# Patient Record
Sex: Female | Born: 1950 | Race: White | Hispanic: No | Marital: Married | Smoking: Never smoker
Health system: Southern US, Community
[De-identification: ages and names within clinical notes are randomized; demographics above are authoritative.]

## PROBLEM LIST (undated history)

## (undated) DIAGNOSIS — E7800 Pure hypercholesterolemia, unspecified: Secondary | ICD-10-CM

## (undated) DIAGNOSIS — R7303 Prediabetes: Secondary | ICD-10-CM

## (undated) DIAGNOSIS — R350 Frequency of micturition: Secondary | ICD-10-CM

## (undated) DIAGNOSIS — C801 Malignant (primary) neoplasm, unspecified: Secondary | ICD-10-CM

## (undated) DIAGNOSIS — N390 Urinary tract infection, site not specified: Secondary | ICD-10-CM

## (undated) DIAGNOSIS — I499 Cardiac arrhythmia, unspecified: Secondary | ICD-10-CM

## (undated) DIAGNOSIS — I8390 Asymptomatic varicose veins of unspecified lower extremity: Secondary | ICD-10-CM

## (undated) DIAGNOSIS — K219 Gastro-esophageal reflux disease without esophagitis: Secondary | ICD-10-CM

## (undated) DIAGNOSIS — Z87898 Personal history of other specified conditions: Secondary | ICD-10-CM

## (undated) DIAGNOSIS — D649 Anemia, unspecified: Secondary | ICD-10-CM

## (undated) DIAGNOSIS — I639 Cerebral infarction, unspecified: Secondary | ICD-10-CM

## (undated) DIAGNOSIS — J40 Bronchitis, not specified as acute or chronic: Secondary | ICD-10-CM

## (undated) DIAGNOSIS — I4891 Unspecified atrial fibrillation: Secondary | ICD-10-CM

## (undated) DIAGNOSIS — M8580 Other specified disorders of bone density and structure, unspecified site: Secondary | ICD-10-CM

## (undated) DIAGNOSIS — M1990 Unspecified osteoarthritis, unspecified site: Secondary | ICD-10-CM

## (undated) DIAGNOSIS — Z8719 Personal history of other diseases of the digestive system: Secondary | ICD-10-CM

## (undated) DIAGNOSIS — R002 Palpitations: Secondary | ICD-10-CM

## (undated) DIAGNOSIS — H269 Unspecified cataract: Secondary | ICD-10-CM

## (undated) HISTORY — PX: OTHER SURGICAL HISTORY: SHX169

## (undated) HISTORY — PX: TENDON REPAIR: SHX5111

---

## 1998-11-24 ENCOUNTER — Other Ambulatory Visit: Admission: RE | Admit: 1998-11-24 | Discharge: 1998-11-24 | Payer: Self-pay | Admitting: Obstetrics & Gynecology

## 1999-06-23 ENCOUNTER — Other Ambulatory Visit: Admission: RE | Admit: 1999-06-23 | Discharge: 1999-06-23 | Payer: Self-pay | Admitting: Gastroenterology

## 2011-09-06 HISTORY — PX: MOUTH SURGERY: SHX715

## 2014-02-17 ENCOUNTER — Other Ambulatory Visit: Payer: Self-pay | Admitting: Obstetrics & Gynecology

## 2014-02-18 LAB — CYTOLOGY - PAP

## 2015-03-05 ENCOUNTER — Other Ambulatory Visit: Payer: Self-pay | Admitting: Obstetrics & Gynecology

## 2015-03-05 DIAGNOSIS — R928 Other abnormal and inconclusive findings on diagnostic imaging of breast: Secondary | ICD-10-CM

## 2015-03-11 ENCOUNTER — Ambulatory Visit
Admission: RE | Admit: 2015-03-11 | Discharge: 2015-03-11 | Disposition: A | Discharge status: Home / Self Care | Payer: 59 | Source: Ambulatory Visit | Attending: Obstetrics & Gynecology | Admitting: Obstetrics & Gynecology

## 2015-03-11 DIAGNOSIS — R928 Other abnormal and inconclusive findings on diagnostic imaging of breast: Secondary | ICD-10-CM

## 2015-06-11 DIAGNOSIS — C801 Malignant (primary) neoplasm, unspecified: Secondary | ICD-10-CM

## 2015-06-11 HISTORY — PX: OTHER SURGICAL HISTORY: SHX169

## 2015-06-11 HISTORY — DX: Malignant (primary) neoplasm, unspecified: C80.1

## 2015-06-26 NOTE — Patient Instructions (Signed)
Tonya Graves  06/26/2015   Your procedure is scheduled on: Monday 07/13/15  Report to [facility] Main  Entrance take [facility]  elevators to 3rd floor to  [place] at 9:40AM.  Call this number if you have problems the morning of surgery [phone]   Remember: ONLY 1 PERSON MAY GO WITH YOU TO SHORT STAY TO GET  READY MORNING OF [place].  Do not eat food or drink liquids :After Midnight.     Take these medicines the morning of surgery with A SIP OF WATER: Omeprazole (Prilosec) DO NOT TAKE ANY DIABETIC MEDICATIONS DAY OF YOUR SURGERY                               You may not have any metal on your body including hair pins and              piercings  Do not wear jewelry, make-up, lotions, powders or perfumes, deodorant             Do not wear nail polish.  Do not shave  48 hours prior to surgery.              Men may shave face and neck.   Do not bring valuables to the hospital. [place].  Contacts, dentures or bridgework may not be worn into surgery.  Leave suitcase in the car. After surgery it may be brought to your room.     Patients discharged the day of surgery will not be allowed to drive home.  Name and phone number of your driver:  Special Instructions: N/A              Please read over the following fact sheets you were given: _____________________________________________________________________             [facility] - Preparing for Surgery Before surgery, you can play an important role.  Because skin is not sterile, your skin needs to be as free of germs as possible.  You can reduce the number of germs on your skin by washing with CHG (chlorahexidine gluconate) soap before surgery.  CHG is an antiseptic cleaner which kills germs and bonds with the skin to continue killing germs even after washing. Please DO NOT use if you have an allergy to CHG or antibacterial soaps.  If your skin  becomes reddened/irritated stop using the CHG and inform your nurse when you arrive at Short Stay. Do not shave (including legs and underarms) for at least 48 hours prior to the first CHG shower.  You may shave your face/neck. Please follow these instructions carefully:  1.  Shower with CHG Soap the night before surgery and the  morning of Surgery.  2.  If you choose to wash your hair, wash your hair first as usual with your  normal  shampoo.  3.  After you shampoo, rinse your hair and body thoroughly to remove the  shampoo.                           4.  Use CHG as you would any other liquid soap.  You can apply chg directly  to the skin and wash  Gently with a scrungie or clean washcloth.  5.  Apply the CHG Soap to your body ONLY FROM THE NECK DOWN.   Do not use on face/ open                           Wound or open sores. Avoid contact with eyes, ears mouth and genitals (private parts).                       Wash face,  Genitals (private parts) with your normal soap.             6.  Wash thoroughly, paying special attention to the area where your surgery  will be performed.  7.  Thoroughly rinse your body with warm water from the neck down.  8.  DO NOT shower/wash with your normal soap after using and rinsing off  the CHG Soap.                9.  Pat yourself dry with a clean towel.            10.  Wear clean pajamas.            11.  Place clean sheets on your bed the night of your first shower and do not  sleep with pets. Day of Surgery : Do not apply any lotions/deodorants the morning of surgery.  Please wear clean clothes to the hospital/surgery center.  FAILURE TO FOLLOW THESE INSTRUCTIONS MAY RESULT IN THE CANCELLATION OF YOUR SURGERY PATIENT SIGNATURE_________________________________  NURSE SIGNATURE__________________________________  ________________________________________________________________________  WHAT IS A BLOOD TRANSFUSION? Blood Transfusion  Information  A transfusion is the replacement of blood or some of its parts. Blood is made up of multiple cells which provide different functions.  Red blood cells carry oxygen and are used for blood loss replacement.  White blood cells fight against infection.  Platelets control bleeding.  Plasma helps clot blood.  Other blood products are available for specialized needs, such as hemophilia or other clotting disorders. BEFORE THE TRANSFUSION  Who gives blood for transfusions?   Healthy volunteers who are fully evaluated to make sure their blood is safe. This is blood bank blood. Transfusion therapy is the safest it has ever been in the practice of medicine. Before blood is taken from a donor, a complete history is taken to make sure that person has no history of diseases nor engages in risky social behavior (examples are intravenous drug use or sexual activity with multiple partners). The donor's travel history is screened to minimize risk of transmitting infections, such as malaria. The donated blood is tested for signs of infectious diseases, such as HIV and hepatitis. The blood is then tested to be sure it is compatible with you in order to minimize the chance of a transfusion reaction. If you or a relative donates blood, this is often done in anticipation of surgery and is not appropriate for emergency situations. It takes many days to process the donated blood. RISKS AND COMPLICATIONS Although transfusion therapy is very safe and saves many lives, the main dangers of transfusion include:   Getting an infectious disease.  Developing a transfusion reaction. This is an allergic reaction to something in the blood you were given. Every precaution is taken to prevent this. The decision to have a blood transfusion has been considered carefully by your caregiver before blood is given. Blood is not given unless the benefits outweigh  the risks. AFTER THE TRANSFUSION  Right after receiving a  blood transfusion, you will usually feel much better and more energetic. This is especially true if your red blood cells have gotten low (anemic). The transfusion raises the level of the red blood cells which carry oxygen, and this usually causes an energy increase.  The nurse administering the transfusion will monitor you carefully for complications. HOME CARE INSTRUCTIONS  No special instructions are needed after a transfusion. You may find your energy is better. Speak with your caregiver about any limitations on activity for underlying diseases you may have. SEEK MEDICAL CARE IF:   Your condition is not improving after your transfusion.  You develop redness or irritation at the intravenous (IV) site. SEEK IMMEDIATE MEDICAL CARE IF:  Any of the following symptoms occur over the next 12 hours:  Shaking chills.  You have a temperature by mouth above 102 F (38.9 C), not controlled by medicine.  Chest, back, or muscle pain.  People around you feel you are not acting correctly or are confused.  Shortness of breath or difficulty breathing.  Dizziness and fainting.  You get a rash or develop hives.  You have a decrease in urine output.  Your urine turns a dark color or changes to pink, red, or brown. Any of the following symptoms occur over the next 10 days:  You have a temperature by mouth above 102 F (38.9 C), not controlled by medicine.  Shortness of breath.  Weakness after normal activity.  The white part of the eye turns yellow (jaundice).  You have a decrease in the amount of urine or are urinating less often.  Your urine turns a dark color or changes to pink, red, or brown. Document Released: 08/19/2000 Document Revised: 11/14/2011 Document Reviewed: 04/07/2008 [facility] Patient Information 2014 [place], [place].  _______________________________________________________________________

## 2015-06-30 ENCOUNTER — Ambulatory Visit: Payer: Self-pay | Admitting: Orthopedic Surgery

## 2015-06-30 ENCOUNTER — Other Ambulatory Visit (HOSPITAL_COMMUNITY): Payer: Self-pay | Admitting: *Deleted

## 2015-06-30 NOTE — Progress Notes (Signed)
pcp clearance dr Raliegh Ip cox on chart for 11 7 surgery ekg 06-23-15 on chart

## 2015-06-30 NOTE — Progress Notes (Signed)
Preoperative surgical orders have been place into the Epic hospital system for Tonya Graves on 06/30/2015, 1:33 PM  by Mickel Crow for surgery on 07-13-15.  Preop Total Knee orders including Experal, IV Tylenol, and IV Decadron as long as there are no contraindications to the above medications. Tonya Muslim, PA-C

## 2015-07-01 ENCOUNTER — Encounter (HOSPITAL_COMMUNITY)
Admission: RE | Admit: 2015-07-01 | Discharge: 2015-07-01 | Disposition: A | Discharge status: Home / Self Care | Payer: 59 | Source: Ambulatory Visit | Attending: Orthopedic Surgery | Admitting: Orthopedic Surgery

## 2015-07-01 ENCOUNTER — Encounter (HOSPITAL_COMMUNITY): Payer: Self-pay

## 2015-07-01 DIAGNOSIS — M179 Osteoarthritis of knee, unspecified: Secondary | ICD-10-CM | POA: Insufficient documentation

## 2015-07-01 DIAGNOSIS — Z01818 Encounter for other preprocedural examination: Secondary | ICD-10-CM | POA: Diagnosis not present

## 2015-07-01 HISTORY — DX: Gastro-esophageal reflux disease without esophagitis: K21.9

## 2015-07-01 HISTORY — DX: Prediabetes: R73.03

## 2015-07-01 HISTORY — DX: Unspecified osteoarthritis, unspecified site: M19.90

## 2015-07-01 HISTORY — DX: Malignant (primary) neoplasm, unspecified: C80.1

## 2015-07-01 HISTORY — DX: Personal history of other specified conditions: Z87.898

## 2015-07-01 HISTORY — DX: Anemia, unspecified: D64.9

## 2015-07-01 LAB — COMPREHENSIVE METABOLIC PANEL
ALT: 16 U/L (ref 14–54)
AST: 22 U/L (ref 15–41)
Albumin: 3.6 g/dL (ref 3.5–5.0)
Alkaline Phosphatase: 63 U/L (ref 38–126)
Anion gap: 9 (ref 5–15)
BUN: 19 mg/dL (ref 6–20)
CO2: 27 mmol/L (ref 22–32)
Calcium: 9.1 mg/dL (ref 8.9–10.3)
Chloride: 106 mmol/L (ref 101–111)
Creatinine, Ser: 0.76 mg/dL (ref 0.44–1.00)
GFR calc Af Amer: >60 mL/min (ref >60)
GFR calc non Af Amer: >60 mL/min (ref >60)
Glucose, Bld: 112 mg/dL — ABNORMAL HIGH (ref 65–99)
Potassium: 4.2 mmol/L (ref 3.5–5.1)
Sodium: 142 mmol/L (ref 135–145)
Total Bilirubin: 0.5 mg/dL (ref 0.3–1.2)
Total Protein: 7.3 g/dL (ref 6.5–8.1)

## 2015-07-01 LAB — CBC
HCT: 37.3 % (ref 36.0–46.0)
Hemoglobin: 11.7 g/dL — ABNORMAL LOW (ref 12.0–15.0)
MCH: 28.7 pg (ref 26.0–34.0)
MCHC: 31.4 g/dL (ref 30.0–36.0)
MCV: 91.6 fL (ref 78.0–100.0)
Platelets: 257 10*3/uL (ref 150–400)
RBC: 4.07 MIL/uL (ref 3.87–5.11)
RDW: 13.8 % (ref 11.5–15.5)
WBC: 4.2 10*3/uL (ref 4.0–10.5)

## 2015-07-01 LAB — URINALYSIS, ROUTINE W REFLEX MICROSCOPIC
Bilirubin Urine: NEGATIVE
Glucose, UA: NEGATIVE mg/dL
Hgb urine dipstick: NEGATIVE
Ketones, ur: NEGATIVE mg/dL
Leukocytes, UA: NEGATIVE
Nitrite: NEGATIVE
Protein, ur: NEGATIVE mg/dL
Specific Gravity, Urine: 1.012 (ref 1.005–1.030)
Urobilinogen, UA: 0.2 mg/dL (ref 0.0–1.0)
pH: 7 (ref 5.0–8.0)

## 2015-07-01 LAB — TYPE AND SCREEN
ABO/RH(D): B NEG
Antibody Screen: NEGATIVE

## 2015-07-01 LAB — SURGICAL PCR SCREEN
MRSA, PCR: NEGATIVE
Staphylococcus aureus: NEGATIVE

## 2015-07-01 LAB — PROTIME-INR
INR: 0.98 (ref 0.00–1.49)
Prothrombin Time: 13.2 seconds (ref 11.6–15.2)

## 2015-07-01 LAB — ABO/RH: ABO/RH(D): B NEG

## 2015-07-01 LAB — APTT: aPTT: 31 seconds (ref 24–37)

## 2015-07-09 NOTE — H&P (Signed)
TOTAL KNEE ADMISSION H&P  Patient is being admitted for right total knee arthroplasty.  Subjective:  Chief Complaint:right knee pain.  HPI: Tonya Graves, 64 y.o. female, has a history of pain and functional disability in the right knee due to arthritis and has failed non-surgical conservative treatments for greater than 12 weeks to includeNSAID's and/or analgesics, corticosteriod injections, viscosupplementation injections and activity modification.  Onset of symptoms was gradual, starting >10 years ago with gradually worsening course since that time. The patient noted no past surgery on the right knee(s).  Patient currently rates pain in the right knee(s) at 8 out of 10 with activity. Patient has night pain, worsening of pain with activity and weight bearing, pain that interferes with activities of daily living, pain with passive range of motion, crepitus and joint swelling.  Patient has evidence of periarticular osteophytes and joint space narrowing by imaging studies.  There is no active infection.   Past Medical History  Diagnosis Date  . GERD (gastroesophageal reflux disease)   . History of palpitations     NONE IN LAST FEW YEARS   . Arthritis     OA  . 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 Anemia YEARS AGO  . Prediabetes     Past Surgical History  Procedure Laterality Date  . Oral surgery for mouth cancer  06-11-2015  .  c sections      X 2  . Tendon repair Right  YEARS AGO    LITTLE FINGER      Current outpatient prescriptions:  .  Calcium Carb-Cholecalciferol (CALCIUM 600 + D PO), Take 1 tablet by mouth 2 (two) times daily., Disp: , Rfl:  .  HYDROcodone-acetaminophen (NORCO/VICODIN) 5-325 MG tablet, Take 1 tablet by mouth every 6 (six) hours as needed for moderate pain (pain related to oral surgery)., Disp: , Rfl:  .  ibuprofen (ADVIL,MOTRIN) 600 MG tablet, Take 600 mg by mouth every 6 (six) hours as needed for mild pain (pain related to  oral surgery)., Disp: , Rfl:  .  methylcellulose oral powder, Take 1 packet by mouth daily., Disp: , Rfl:  .  Multiple Vitamins-Minerals (MULTIVITAMINS THER. W/MINERALS) TABS tablet, Take 1 tablet by mouth daily., Disp: , Rfl:  .  naproxen sodium (ANAPROX) 220 MG tablet, Take 440 mg by mouth daily as needed (knee pain)., Disp: , Rfl:  .  omeprazole (PRILOSEC) 20 MG capsule, Take 20 mg by mouth daily., Disp: , Rfl:  .  Polyethyl Glycol-Propyl Glycol (SYSTANE OP), Place 1 drop into both eyes 2 (two) times daily., Disp: , Rfl:  .  Red Yeast Rice Extract (RED YEAST RICE PO), Take 1 tablet by mouth 2 (two) times daily., Disp: , Rfl:   No Known Allergies  Social History  Substance Use Topics  . Smoking status: Never Smoker   . Smokeless tobacco: Never Used  . Alcohol Use: No      Review of Systems  Constitutional: Negative.   HENT: Negative.   Eyes: Negative.   Respiratory: Positive for shortness of breath. Negative for cough, hemoptysis, sputum production and wheezing.        SOB with exertion  Cardiovascular: Positive for palpitations. Negative for chest pain, orthopnea, claudication, leg swelling and PND.  Gastrointestinal: Positive for heartburn and constipation. Negative for nausea, vomiting, abdominal pain, diarrhea, blood in stool and melena.  Genitourinary: Positive for frequency. Negative for dysuria, urgency, hematuria and flank pain.  Musculoskeletal: Positive  for myalgias and joint pain. Negative for back pain, falls and neck pain.       Right knee pain  Skin: Positive for itching. Negative for rash.  Neurological: Negative.   Endo/Heme/Allergies: Negative.   Psychiatric/Behavioral: Negative.     Objective:  Physical Exam  Constitutional: She is oriented to person, place, and time. She appears well-developed. No distress.  Obese  HENT:  Head: Normocephalic and atraumatic.  Right Ear: External ear normal.  Left Ear: External ear normal.  Nose: Nose normal.   Mouth/Throat: Oropharynx is clear and moist.  Eyes: Conjunctivae and EOM are normal.  Neck: Normal range of motion. Neck supple.  Cardiovascular: Normal rate, regular rhythm, normal heart sounds and intact distal pulses.   No murmur heard. Respiratory: Effort normal and breath sounds normal. No respiratory distress. She has no wheezes.  GI: Soft. Bowel sounds are normal. She exhibits no distension. There is no tenderness.  Musculoskeletal:       Right hip: Normal.       Left hip: Normal.       Right knee: She exhibits decreased range of motion and swelling. She exhibits no effusion and no erythema. Tenderness found. Medial joint line and lateral joint line tenderness noted.       Left knee: Normal.  Right knee shows no effusion. Range about 10 to 100, marked crepitus on range of motion, tender medial greater than lateral. No instability.  Neurological: She is alert and oriented to person, place, and time. She has normal strength and normal reflexes. No sensory deficit.  Skin: No rash noted. She is not diaphoretic. No erythema.  Psychiatric: She has a normal mood and affect. Her behavior is normal.     Vitals  Weight: 196 lb Height: 61in Body Surface Area: 1.87 m Body Mass Index: 37.03 kg/m  Pulse: 72 (Regular)  BP: 132/82 (Sitting, Left Arm, Standard)   Imaging Review Plain radiographs demonstrate severe degenerative joint disease of the right knee(s). The overall alignment ismild varus. The bone quality appears to be good for age and reported activity level.  Assessment/Plan:  End stage primary osteoarthritis, right knee   The patient history, physical examination, clinical judgment of the provider and imaging studies are consistent with end stage degenerative joint disease of the right knee(s) and total knee arthroplasty is deemed medically necessary. The treatment options including medical management, injection therapy arthroscopy and arthroplasty were discussed at  length. The risks and benefits of total knee arthroplasty were presented and reviewed. The risks due to aseptic loosening, infection, stiffness, patella tracking problems, thromboembolic complications and other imponderables were discussed. The patient acknowledged the explanation, agreed to proceed with the plan and consent was signed. Patient is being admitted for inpatient treatment for surgery, pain control, PT, OT, prophylactic antibiotics, VTE prophylaxis, progressive ambulation and ADL's and discharge planning. The patient is planning to be discharged home with home health services     PCP: Dr. Rochel Brome Dr. Aileen Pilot Simoncic     Ardeen Jourdain , PA-C

## 2015-07-13 ENCOUNTER — Encounter (HOSPITAL_COMMUNITY): Payer: Self-pay | Admitting: *Deleted

## 2015-07-13 ENCOUNTER — Encounter (HOSPITAL_COMMUNITY)
Admission: RE | Disposition: A | Discharge status: Home Health | Payer: Self-pay | Source: Ambulatory Visit | Attending: Orthopedic Surgery

## 2015-07-13 ENCOUNTER — Inpatient Hospital Stay (HOSPITAL_COMMUNITY): Payer: 59 | Admitting: Anesthesiology

## 2015-07-13 ENCOUNTER — Inpatient Hospital Stay (HOSPITAL_COMMUNITY)
Admission: RE | Admit: 2015-07-13 | Discharge: 2015-07-15 | DRG: 470 | Disposition: A | Discharge status: Home Health | Payer: 59 | Source: Ambulatory Visit | Attending: Orthopedic Surgery | Admitting: Orthopedic Surgery

## 2015-07-13 DIAGNOSIS — M1710 Unilateral primary osteoarthritis, unspecified knee: Secondary | ICD-10-CM | POA: Diagnosis present

## 2015-07-13 DIAGNOSIS — Z6837 Body mass index (BMI) 37.0-37.9, adult: Secondary | ICD-10-CM | POA: Diagnosis not present

## 2015-07-13 DIAGNOSIS — M1711 Unilateral primary osteoarthritis, right knee: Principal | ICD-10-CM | POA: Diagnosis present

## 2015-07-13 DIAGNOSIS — K219 Gastro-esophageal reflux disease without esophagitis: Secondary | ICD-10-CM | POA: Diagnosis present

## 2015-07-13 DIAGNOSIS — Z79899 Other long term (current) drug therapy: Secondary | ICD-10-CM | POA: Diagnosis not present

## 2015-07-13 DIAGNOSIS — M179 Osteoarthritis of knee, unspecified: Secondary | ICD-10-CM | POA: Diagnosis present

## 2015-07-13 DIAGNOSIS — E669 Obesity, unspecified: Secondary | ICD-10-CM | POA: Diagnosis present

## 2015-07-13 DIAGNOSIS — M25561 Pain in right knee: Secondary | ICD-10-CM | POA: Diagnosis present

## 2015-07-13 HISTORY — PX: TOTAL KNEE ARTHROPLASTY: SHX125

## 2015-07-13 SURGERY — ARTHROPLASTY, KNEE, TOTAL
Anesthesia: Spinal | Site: Knee | Laterality: Right

## 2015-07-13 MED ORDER — LIDOCAINE HCL (CARDIAC) 20 MG/ML IV SOLN
INTRAVENOUS | Status: AC
  Filled 2015-07-13: qty 5

## 2015-07-13 MED ORDER — BUPIVACAINE LIPOSOME 1.3 % IJ SUSP
INTRAMUSCULAR | Status: DC | PRN
  Administered 2015-07-13: 20 mL

## 2015-07-13 MED ORDER — DEXAMETHASONE SODIUM PHOSPHATE 10 MG/ML IJ SOLN
10.0000 mg | Freq: Once | INTRAMUSCULAR | Status: AC
  Administered 2015-07-14: 10 mg via INTRAVENOUS
  Filled 2015-07-13 (×2): qty 1

## 2015-07-13 MED ORDER — CEFAZOLIN SODIUM-DEXTROSE 2-3 GM-% IV SOLR
2.0000 g | Freq: Four times a day (QID) | INTRAVENOUS | Status: AC
  Administered 2015-07-13 (×2): 2 g via INTRAVENOUS
  Filled 2015-07-13 (×2): qty 50

## 2015-07-13 MED ORDER — METHOCARBAMOL 500 MG PO TABS
500.0000 mg | ORAL_TABLET | Freq: Four times a day (QID) | ORAL | Status: DC | PRN
  Administered 2015-07-13 – 2015-07-15 (×4): 500 mg via ORAL
  Filled 2015-07-13 (×4): qty 1

## 2015-07-13 MED ORDER — ONDANSETRON HCL 4 MG PO TABS: 4.0000 mg | ORAL_TABLET | Freq: Four times a day (QID) | ORAL | Status: DC | PRN

## 2015-07-13 MED ORDER — ACETAMINOPHEN 10 MG/ML IV SOLN
INTRAVENOUS | Status: AC
  Filled 2015-07-13: qty 100

## 2015-07-13 MED ORDER — CHLORHEXIDINE GLUCONATE 4 % EX LIQD: 60.0000 mL | Freq: Once | CUTANEOUS | Status: DC

## 2015-07-13 MED ORDER — POLYETHYLENE GLYCOL 3350 17 G PO PACK: 17.0000 g | PACK | Freq: Every day | ORAL | Status: DC | PRN

## 2015-07-13 MED ORDER — SODIUM CHLORIDE 0.9 % IR SOLN
Status: DC | PRN
  Administered 2015-07-13: 1000 mL

## 2015-07-13 MED ORDER — DEXAMETHASONE SODIUM PHOSPHATE 10 MG/ML IJ SOLN
10.0000 mg | Freq: Once | INTRAMUSCULAR | Status: AC
  Administered 2015-07-13: 10 mg via INTRAVENOUS

## 2015-07-13 MED ORDER — BUPIVACAINE HCL 0.25 % IJ SOLN
INTRAMUSCULAR | Status: DC | PRN
  Administered 2015-07-13: 30 mL

## 2015-07-13 MED ORDER — LACTATED RINGERS IV SOLN
INTRAVENOUS | Status: DC
  Administered 2015-07-13: 13:00:00 via INTRAVENOUS
  Administered 2015-07-13: 1000 mL via INTRAVENOUS

## 2015-07-13 MED ORDER — METHOCARBAMOL 1000 MG/10ML IJ SOLN
500.0000 mg | Freq: Four times a day (QID) | INTRAVENOUS | Status: DC | PRN
  Administered 2015-07-13: 500 mg via INTRAVENOUS
  Filled 2015-07-13 (×2): qty 5

## 2015-07-13 MED ORDER — PROPOFOL 10 MG/ML IV BOLUS
INTRAVENOUS | Status: AC
  Filled 2015-07-13: qty 20

## 2015-07-13 MED ORDER — ACETAMINOPHEN 10 MG/ML IV SOLN
1000.0000 mg | Freq: Once | INTRAVENOUS | Status: AC
  Administered 2015-07-13: 1000 mg via INTRAVENOUS
  Filled 2015-07-13: qty 100

## 2015-07-13 MED ORDER — ACETAMINOPHEN 500 MG PO TABS
1000.0000 mg | ORAL_TABLET | Freq: Four times a day (QID) | ORAL | Status: AC
  Administered 2015-07-13 – 2015-07-14 (×3): 1000 mg via ORAL
  Filled 2015-07-13 (×3): qty 2

## 2015-07-13 MED ORDER — MIDAZOLAM HCL 2 MG/2ML IJ SOLN
INTRAMUSCULAR | Status: AC
  Filled 2015-07-13: qty 4

## 2015-07-13 MED ORDER — SODIUM CHLORIDE 0.9 % IJ SOLN
INTRAMUSCULAR | Status: DC | PRN
  Administered 2015-07-13: 30 mL via INTRAVENOUS

## 2015-07-13 MED ORDER — DIPHENHYDRAMINE HCL 12.5 MG/5ML PO ELIX: 12.5000 mg | ORAL_SOLUTION | ORAL | Status: DC | PRN

## 2015-07-13 MED ORDER — DEXAMETHASONE SODIUM PHOSPHATE 10 MG/ML IJ SOLN
INTRAMUSCULAR | Status: AC
  Filled 2015-07-13: qty 1

## 2015-07-13 MED ORDER — SODIUM CHLORIDE 0.9 % IV SOLN: INTRAVENOUS | Status: DC

## 2015-07-13 MED ORDER — MIDAZOLAM HCL 5 MG/5ML IJ SOLN
INTRAMUSCULAR | Status: DC | PRN
  Administered 2015-07-13: 2 mg via INTRAVENOUS

## 2015-07-13 MED ORDER — ONDANSETRON HCL 4 MG/2ML IJ SOLN: 4.0000 mg | Freq: Four times a day (QID) | INTRAMUSCULAR | Status: DC | PRN

## 2015-07-13 MED ORDER — PANTOPRAZOLE SODIUM 40 MG PO TBEC
40.0000 mg | DELAYED_RELEASE_TABLET | Freq: Every day | ORAL | Status: DC
  Administered 2015-07-14: 40 mg via ORAL
  Filled 2015-07-13 (×2): qty 1

## 2015-07-13 MED ORDER — METOCLOPRAMIDE HCL 10 MG PO TABS: 5.0000 mg | ORAL_TABLET | Freq: Three times a day (TID) | ORAL | Status: DC | PRN

## 2015-07-13 MED ORDER — PHENOL 1.4 % MT LIQD: 1.0000 | OROMUCOSAL | Status: DC | PRN

## 2015-07-13 MED ORDER — DOCUSATE SODIUM 100 MG PO CAPS
100.0000 mg | ORAL_CAPSULE | Freq: Two times a day (BID) | ORAL | Status: DC
  Administered 2015-07-13 – 2015-07-15 (×4): 100 mg via ORAL

## 2015-07-13 MED ORDER — SODIUM CHLORIDE 0.9 % IJ SOLN
INTRAMUSCULAR | Status: AC
  Filled 2015-07-13: qty 50

## 2015-07-13 MED ORDER — FLEET ENEMA 7-19 GM/118ML RE ENEM: 1.0000 | ENEMA | Freq: Once | RECTAL | Status: DC | PRN

## 2015-07-13 MED ORDER — 0.9 % SODIUM CHLORIDE (POUR BTL) OPTIME
TOPICAL | Status: DC | PRN
  Administered 2015-07-13: 1000 mL

## 2015-07-13 MED ORDER — ONDANSETRON HCL 4 MG/2ML IJ SOLN
INTRAMUSCULAR | Status: DC | PRN
  Administered 2015-07-13: 4 mg via INTRAVENOUS

## 2015-07-13 MED ORDER — CEFAZOLIN SODIUM-DEXTROSE 2-3 GM-% IV SOLR
2.0000 g | INTRAVENOUS | Status: AC
  Administered 2015-07-13: 2 g via INTRAVENOUS

## 2015-07-13 MED ORDER — METOCLOPRAMIDE HCL 5 MG/ML IJ SOLN: 5.0000 mg | Freq: Three times a day (TID) | INTRAMUSCULAR | Status: DC | PRN

## 2015-07-13 MED ORDER — TRANEXAMIC ACID 1000 MG/10ML IV SOLN
1000.0000 mg | INTRAVENOUS | Status: AC
  Administered 2015-07-13: 1000 mg via INTRAVENOUS
  Filled 2015-07-13: qty 10

## 2015-07-13 MED ORDER — KETOROLAC TROMETHAMINE 15 MG/ML IJ SOLN
7.5000 mg | Freq: Four times a day (QID) | INTRAMUSCULAR | Status: AC | PRN
  Administered 2015-07-14: 7.5 mg via INTRAVENOUS
  Filled 2015-07-13: qty 1

## 2015-07-13 MED ORDER — TRAMADOL HCL 50 MG PO TABS: 50.0000 mg | ORAL_TABLET | Freq: Four times a day (QID) | ORAL | Status: DC | PRN

## 2015-07-13 MED ORDER — ACETAMINOPHEN 650 MG RE SUPP: 650.0000 mg | Freq: Four times a day (QID) | RECTAL | Status: DC | PRN

## 2015-07-13 MED ORDER — MENTHOL 3 MG MT LOZG: 1.0000 | LOZENGE | OROMUCOSAL | Status: DC | PRN

## 2015-07-13 MED ORDER — BISACODYL 10 MG RE SUPP: 10.0000 mg | Freq: Every day | RECTAL | Status: DC | PRN

## 2015-07-13 MED ORDER — PROPOFOL 500 MG/50ML IV EMUL
INTRAVENOUS | Status: DC | PRN
  Administered 2015-07-13: 100 ug/kg/min via INTRAVENOUS

## 2015-07-13 MED ORDER — MORPHINE SULFATE (PF) 2 MG/ML IV SOLN
1.0000 mg | INTRAVENOUS | Status: DC | PRN
Start: 2015-07-13 — End: 2015-07-15
  Administered 2015-07-13 – 2015-07-14 (×3): 1 mg via INTRAVENOUS
  Filled 2015-07-13 (×3): qty 1

## 2015-07-13 MED ORDER — SODIUM CHLORIDE 0.9 % IV SOLN
INTRAVENOUS | Status: DC
  Administered 2015-07-13 – 2015-07-14 (×2): via INTRAVENOUS

## 2015-07-13 MED ORDER — ACETAMINOPHEN 325 MG PO TABS: 650.0000 mg | ORAL_TABLET | Freq: Four times a day (QID) | ORAL | Status: DC | PRN

## 2015-07-13 MED ORDER — BUPIVACAINE HCL (PF) 0.25 % IJ SOLN
INTRAMUSCULAR | Status: AC
  Filled 2015-07-13: qty 30

## 2015-07-13 MED ORDER — OXYCODONE HCL 5 MG PO TABS
5.0000 mg | ORAL_TABLET | ORAL | Status: DC | PRN
  Administered 2015-07-13 – 2015-07-15 (×12): 10 mg via ORAL
  Filled 2015-07-13 (×12): qty 2

## 2015-07-13 MED ORDER — CEFAZOLIN SODIUM-DEXTROSE 2-3 GM-% IV SOLR
INTRAVENOUS | Status: AC
  Filled 2015-07-13: qty 50

## 2015-07-13 MED ORDER — ONDANSETRON HCL 4 MG/2ML IJ SOLN
INTRAMUSCULAR | Status: AC
  Filled 2015-07-13: qty 2

## 2015-07-13 MED ORDER — RIVAROXABAN 10 MG PO TABS
10.0000 mg | ORAL_TABLET | Freq: Every day | ORAL | Status: DC
  Administered 2015-07-14 – 2015-07-15 (×2): 10 mg via ORAL
  Filled 2015-07-13 (×3): qty 1

## 2015-07-13 MED ORDER — BUPIVACAINE LIPOSOME 1.3 % IJ SUSP
20.0000 mL | Freq: Once | INTRAMUSCULAR | Status: DC
  Filled 2015-07-13: qty 20

## 2015-07-13 MED ORDER — EPHEDRINE SULFATE 50 MG/ML IJ SOLN
INTRAMUSCULAR | Status: DC | PRN
  Administered 2015-07-13: 5 mg via INTRAVENOUS

## 2015-07-13 SURGICAL SUPPLY — 49 items
BAG DECANTER FOR FLEXI CONT (MISCELLANEOUS) ×1 IMPLANT
BAG SPEC THK2 15X12 ZIP CLS (MISCELLANEOUS) ×1
BAG ZIPLOCK 12X15 (MISCELLANEOUS) ×2 IMPLANT
BANDAGE ELASTIC 6 VELCRO ST LF (GAUZE/BANDAGES/DRESSINGS) ×2 IMPLANT
BLADE SAG 18X100X1.27 (BLADE) ×2 IMPLANT
BLADE SAW SGTL 11.0X1.19X90.0M (BLADE) ×2 IMPLANT
BOWL SMART MIX CTS (DISPOSABLE) ×2 IMPLANT
CAPT KNEE TOTAL 3 ATTUNE ×1 IMPLANT
CEMENT HV SMART SET (Cement) ×4 IMPLANT
CLOTH BEACON ORANGE TIMEOUT ST (SAFETY) ×2 IMPLANT
CUFF TOURN SGL QUICK 34 (TOURNIQUET CUFF) ×2
CUFF TRNQT CYL 34X4X40X1 (TOURNIQUET CUFF) ×1 IMPLANT
DECANTER SPIKE VIAL GLASS SM (MISCELLANEOUS) ×2 IMPLANT
DRAPE U-SHAPE 47X51 STRL (DRAPES) ×2 IMPLANT
DRSG ADAPTIC 3X8 NADH LF (GAUZE/BANDAGES/DRESSINGS) ×2 IMPLANT
DRSG PAD ABDOMINAL 8X10 ST (GAUZE/BANDAGES/DRESSINGS) ×2 IMPLANT
DURAPREP 26ML APPLICATOR (WOUND CARE) ×2 IMPLANT
ELECT REM PT RETURN 9FT ADLT (ELECTROSURGICAL) ×2
ELECTRODE REM PT RTRN 9FT ADLT (ELECTROSURGICAL) ×1 IMPLANT
EVACUATOR 1/8 PVC DRAIN (DRAIN) ×2 IMPLANT
GAUZE SPONGE 4X4 12PLY STRL (GAUZE/BANDAGES/DRESSINGS) ×2 IMPLANT
GLOVE BIO SURGEON STRL SZ7.5 (GLOVE) IMPLANT
GLOVE BIO SURGEON STRL SZ8 (GLOVE) ×2 IMPLANT
GLOVE BIOGEL PI IND STRL 6.5 (GLOVE) IMPLANT
GLOVE BIOGEL PI IND STRL 8 (GLOVE) ×1 IMPLANT
GLOVE BIOGEL PI INDICATOR 6.5 (GLOVE)
GLOVE BIOGEL PI INDICATOR 8 (GLOVE) ×1
GLOVE SURG SS PI 6.5 STRL IVOR (GLOVE) IMPLANT
GOWN STRL REUS W/TWL LRG LVL3 (GOWN DISPOSABLE) ×2 IMPLANT
GOWN STRL REUS W/TWL XL LVL3 (GOWN DISPOSABLE) IMPLANT
HANDPIECE INTERPULSE COAX TIP (DISPOSABLE) ×2
IMMOBILIZER KNEE 20 (SOFTGOODS) ×3 IMPLANT
IMMOBILIZER KNEE 20 THIGH 36 (SOFTGOODS) ×1 IMPLANT
MANIFOLD NEPTUNE II (INSTRUMENTS) ×2 IMPLANT
NS IRRIG 1000ML POUR BTL (IV SOLUTION) ×2 IMPLANT
PACK TOTAL KNEE CUSTOM (KITS) ×2 IMPLANT
PADDING CAST COTTON 6X4 STRL (CAST SUPPLIES) ×4 IMPLANT
POSITIONER SURGICAL ARM (MISCELLANEOUS) ×2 IMPLANT
SET HNDPC FAN SPRY TIP SCT (DISPOSABLE) ×1 IMPLANT
STRIP CLOSURE SKIN 1/2X4 (GAUZE/BANDAGES/DRESSINGS) ×3 IMPLANT
SUT MNCRL AB 4-0 PS2 18 (SUTURE) ×2 IMPLANT
SUT VIC AB 2-0 CT1 27 (SUTURE) ×8
SUT VIC AB 2-0 CT1 TAPERPNT 27 (SUTURE) ×3 IMPLANT
SUT VLOC 180 0 24IN GS25 (SUTURE) ×2 IMPLANT
SYR 50ML LL SCALE MARK (SYRINGE) ×2 IMPLANT
TRAY FOLEY W/METER SILVER 14FR (SET/KITS/TRAYS/PACK) ×2 IMPLANT
WATER STERILE IRR 1500ML POUR (IV SOLUTION) ×2 IMPLANT
WRAP KNEE MAXI GEL POST OP (GAUZE/BANDAGES/DRESSINGS) ×2 IMPLANT
YANKAUER SUCT BULB TIP 10FT TU (MISCELLANEOUS) ×2 IMPLANT

## 2015-07-13 NOTE — Anesthesia Procedure Notes (Signed)
Spinal Patient location during procedure: OR Start time: 07/13/2015 12:13 PM End time: 07/13/2015 12:17 PM Staffing Resident/CRNA: Hartleigh Edmonston G Performed by: anesthesiologist and resident/CRNA  Preanesthetic Checklist Completed: patient identified, site marked, surgical consent, pre-op evaluation, timeout performed, IV checked, risks and benefits discussed and monitors and equipment checked Spinal Block Patient position: sitting Prep: Betadine Patient monitoring: heart rate, continuous pulse ox and blood pressure Approach: midline Location: L2-3 Injection technique: single-shot Needle Needle type: Sprotte  Needle gauge: 24 G Needle length: 10 cm Needle insertion depth: 5 cm Assessment Sensory level: T6 Additional Notes Expiration date of kit checked and confirmed. Patient tolerated procedure well, without complications.

## 2015-07-13 NOTE — Transfer of Care (Signed)
Immediate Anesthesia Transfer of Care Note  Patient: Tonya Graves  Procedure(s) Performed: Procedure(s): RIGHT TOTAL KNEE ARTHROPLASTY (Right)  Patient Location: PACU  Anesthesia Type:MAC and Regional  Level of Consciousness: awake, alert  and oriented  Airway & Oxygen Therapy: Patient Spontanous Breathing and Patient connected to face mask oxygen  Post-op Assessment: Report given to RN and Post -op Vital signs reviewed and stable  Post vital signs: Reviewed and stable  Last Vitals:  Filed Vitals:   07/13/15 0934  BP: 169/76  Pulse: 97  Temp: 36.6 C  Resp: 18    Complications: No apparent anesthesia complications

## 2015-07-13 NOTE — Op Note (Signed)
Pre-operative diagnosis- Osteoarthritis  Right knee(s)  Post-operative diagnosis- Osteoarthritis Right knee(s)  Procedure-  Right  Total Knee Arthroplasty  Surgeon- Tonya Plover. Jeslin Bazinet, MD  Assistant- Arlee Muslim, PA-C   Anesthesia-  Spinal  EBL-* No blood loss amount entered *   Drains Hemovac  Tourniquet time-  Total Tourniquet Time Documented: Thigh (Right) - 32 minutes Total: Thigh (Right) - 32 minutes     Complications- None  Condition-PACU - hemodynamically stable.   Brief Clinical Note  Tonya Graves is a 64 y.o. year old female with end stage OA of her right knee with progressively worsening pain and dysfunction. She has constant pain, with activity and at rest and significant functional deficits with difficulties even with ADLs. She has had extensive non-op management including analgesics, injections of cortisone and viscosupplements, and home exercise program, but remains in significant pain with significant dysfunction.Radiographs show bone on bone arthritis medial and patellofemoral. She presents now for right Total Knee Arthroplasty.    Procedure in detail---   The patient is brought into the operating room and positioned supine on the operating table. After successful administration of  Spinal,   a tourniquet is placed high on the  Right thigh(s) and the lower extremity is prepped and draped in the usual sterile fashion. Time out is performed by the operating team and then the  Right lower extremity is wrapped in Esmarch, knee flexed and the tourniquet inflated to 300 mmHg.       A midline incision is made with a ten blade through the subcutaneous tissue to the level of the extensor mechanism. A fresh blade is used to make a medial parapatellar arthrotomy. Soft tissue over the proximal medial tibia is subperiosteally elevated to the joint line with a knife and into the semimembranosus bursa with a Cobb elevator. Soft tissue over the proximal lateral tibia is elevated  with attention being paid to avoiding the patellar tendon on the tibial tubercle. The patella is everted, knee flexed 90 degrees and the ACL and PCL are removed. Findings are bone on bone medial and patellofemoral with large global osteophytes.        The drill is used to create a starting hole in the distal femur and the canal is thoroughly irrigated with sterile saline to remove the fatty contents. The 5 degree Right  valgus alignment guide is placed into the femoral canal and the distal femoral cutting block is pinned to remove 9 mm off the distal femur. Resection is made with an oscillating saw.      The tibia is subluxed forward and the menisci are removed. The extramedullary alignment guide is placed referencing proximally at the medial aspect of the tibial tubercle and distally along the second metatarsal axis and tibial crest. The block is pinned to remove 29mm off the more deficient medial  side. Resection is made with an oscillating saw. Size 3is the most appropriate size for the tibia and the proximal tibia is prepared with the modular drill and keel punch for that size.      The femoral sizing guide is placed and size 3 is most appropriate. Rotation is marked off the epicondylar axis and confirmed by creating a rectangular flexion gap at 90 degrees. The size 3 cutting block is pinned in this rotation and the anterior, posterior and chamfer cuts are made with the oscillating saw. The intercondylar block is then placed and that cut is made.      Trial size 3 tibial component, trial  size 3 posterior stabilized femur and a 8  mm posterior stabilized rotating platform insert trial is placed. Full extension is achieved with excellent varus/valgus and anterior/posterior balance throughout full range of motion. The patella is everted and thickness measured to be 22  mm. Free hand resection is taken to 12 mm, a 35 template is placed, lug holes are drilled, trial patella is placed, and it tracks normally.  Osteophytes are removed off the posterior femur with the trial in place. All trials are removed and the cut bone surfaces prepared with pulsatile lavage. Cement is mixed and once ready for implantation, the size 3 tibial implant, size  3 posterior stabilized femoral component, and the size 35 patella are cemented in place and the patella is held with the clamp. The trial insert is placed and the knee held in full extension. The Exparel (20 ml mixed with 30 ml saline) and .25% Bupivicaine, are injected into the extensor mechanism, posterior capsule, medial and lateral gutters and subcutaneous tissues.  All extruded cement is removed and once the cement is hard the permanent 8 mm posterior stabilized rotating platform insert is placed into the tibial tray.      The wound is copiously irrigated with saline solution and the extensor mechanism closed over a hemovac drain with #1 V-loc suture. The tourniquet is released for a total tourniquet time of 32  minutes. Flexion against gravity is 140 degrees and the patella tracks normally. Subcutaneous tissue is closed with 2.0 vicryl and subcuticular with running 4.0 Monocryl. The incision is cleaned and dried and steri-strips and a bulky sterile dressing are applied. The limb is placed into a knee immobilizer and the patient is awakened and transported to recovery in stable condition.      Please note that a surgical assistant was a medical necessity for this procedure in order to perform it in a safe and expeditious manner. Surgical assistant was necessary to retract the ligaments and vital neurovascular structures to prevent injury to them and also necessary for proper positioning of the limb to allow for anatomic placement of the prosthesis.   Tonya Plover Cornel Werber, MD    07/13/2015, 1:14 PM

## 2015-07-13 NOTE — Anesthesia Preprocedure Evaluation (Addendum)
Anesthesia Evaluation  Patient identified by MRN, date of birth, ID band Patient awake    Reviewed: Allergy & Precautions, NPO status , Patient's Chart, lab work & pertinent test results  History of Anesthesia Complications (+) POST - OP SPINAL HEADACHE and history of anesthetic complications (spinal headache after caesarian 1980's)  Airway Mallampati: II  TM Distance: >3 FB Neck ROM: Full    Dental  (+) Dental Advisory Given, Teeth Intact   Pulmonary neg pulmonary ROS,    breath sounds clear to auscultation       Cardiovascular (-) anginanegative cardio ROS   Rhythm:Regular Rate:Normal     Neuro/Psych negative neurological ROS     GI/Hepatic Neg liver ROS, GERD  Medicated and Controlled,  Endo/Other  Morbid obesity  Renal/GU negative Renal ROS     Musculoskeletal  (+) Arthritis , Osteoarthritis,    Abdominal (+) + obese,   Peds  Hematology negative hematology ROS (+)   Anesthesia Other Findings   Reproductive/Obstetrics                            Anesthesia Physical Anesthesia Plan  ASA: II  Anesthesia Plan: Spinal   Post-op Pain Management:    Induction:   Airway Management Planned: Natural Airway and Simple Face Mask  Additional Equipment:   Intra-op Plan:   Post-operative Plan:   Informed Consent: I have reviewed the patients History and Physical, chart, labs and discussed the procedure including the risks, benefits and alternatives for the proposed anesthesia with the patient or authorized representative who has indicated his/her understanding and acceptance.   Dental advisory given  Plan Discussed with: CRNA and Surgeon  Anesthesia Plan Comments: (Plan routine monitors, SAB)        Anesthesia Quick Evaluation

## 2015-07-13 NOTE — Interval H&P Note (Signed)
History and Physical Interval Note:  07/13/2015 10:15 AM  Tonya Graves  has presented today for surgery, with the diagnosis of OA RIGHT KNEE  The various methods of treatment have been discussed with the patient and family. After consideration of risks, benefits and other options for treatment, the patient has consented to  Procedure(s): RIGHT TOTAL KNEE ARTHROPLASTY (Right) as a surgical intervention .  The patient's history has been reviewed, patient examined, no change in status, stable for surgery.  I have reviewed the patient's chart and labs.  Questions were answered to the patient's satisfaction.     Gearlean Alf

## 2015-07-13 NOTE — Anesthesia Postprocedure Evaluation (Signed)
  Anesthesia Post-op Note  Patient: Tonya Graves  Procedure(s) Performed: Procedure(s): RIGHT TOTAL KNEE ARTHROPLASTY (Right)  Patient Location: PACU  Anesthesia Type:Spinal  Level of Consciousness: awake, alert , oriented and patient cooperative  Airway and Oxygen Therapy: Patient Spontanous Breathing  Post-op Pain: none  Post-op Assessment: Post-op Vital signs reviewed, Patient's Cardiovascular Status Stable, Respiratory Function Stable, Patent Airway, No signs of Nausea or vomiting, Pain level controlled, No headache, No backache and Spinal receding LLE Motor Response: No movement due to regional block LLE Sensation: Numbness RLE Motor Response: No movement due to regional block RLE Sensation: Numbness L Sensory Level: L2-Upper inner thigh, upper buttock R Sensory Level: L2-Upper inner thigh, upper buttock  Post-op Vital Signs: Reviewed and stable  Last Vitals:  Filed Vitals:   07/13/15 1350  BP: 114/62  Pulse: 72  Temp: 36.5 C  Resp: 22    Complications: No apparent anesthesia complications

## 2015-07-14 ENCOUNTER — Encounter (HOSPITAL_COMMUNITY): Payer: Self-pay | Admitting: Orthopedic Surgery

## 2015-07-14 LAB — CBC
HCT: 31.6 % — ABNORMAL LOW (ref 36.0–46.0)
Hemoglobin: 10.4 g/dL — ABNORMAL LOW (ref 12.0–15.0)
MCH: 29.3 pg (ref 26.0–34.0)
MCHC: 32.9 g/dL (ref 30.0–36.0)
MCV: 89 fL (ref 78.0–100.0)
Platelets: 232 10*3/uL (ref 150–400)
RBC: 3.55 MIL/uL — ABNORMAL LOW (ref 3.87–5.11)
RDW: 13.6 % (ref 11.5–15.5)
WBC: 7.6 10*3/uL (ref 4.0–10.5)

## 2015-07-14 LAB — BASIC METABOLIC PANEL
Anion gap: 6 (ref 5–15)
BUN: 11 mg/dL (ref 6–20)
CO2: 26 mmol/L (ref 22–32)
Calcium: 8.4 mg/dL — ABNORMAL LOW (ref 8.9–10.3)
Chloride: 105 mmol/L (ref 101–111)
Creatinine, Ser: 0.63 mg/dL (ref 0.44–1.00)
GFR calc Af Amer: >60 mL/min (ref >60)
GFR calc non Af Amer: >60 mL/min (ref >60)
Glucose, Bld: 160 mg/dL — ABNORMAL HIGH (ref 65–99)
Potassium: 4.1 mmol/L (ref 3.5–5.1)
Sodium: 137 mmol/L (ref 135–145)

## 2015-07-14 MED ORDER — ALUM & MAG HYDROXIDE-SIMETH 200-200-20 MG/5ML PO SUSP
30.0000 mL | Freq: Four times a day (QID) | ORAL | Status: DC | PRN
  Administered 2015-07-14: 30 mL via ORAL
  Filled 2015-07-14: qty 30

## 2015-07-14 MED ORDER — OMEPRAZOLE 20 MG PO CPDR
20.0000 mg | DELAYED_RELEASE_CAPSULE | Freq: Every day | ORAL | Status: DC
  Administered 2015-07-15: 20 mg via ORAL
  Filled 2015-07-14: qty 1

## 2015-07-14 NOTE — Evaluation (Signed)
Occupational Therapy Evaluation Patient Details Name: Tonya Graves MRN: [id number] DOB: December 15, 1950 Today's Date: 07/14/2015    History of Present Illness 64 yo female s/p R TKA. PMH: GERD, cancer, arthritis, anemia, prediabetes   Clinical Impression   Patient presenting with decreased ADL and functional mobility independence secondary to recent surgery. Patient independent PTA. Patient currently functioning at an overall min to mod assist level. Pt with 3 LOB episodes during session, therapist helped pt regain balance. Patient will benefit from acute OT to increase overall independence in the areas of ADLs, functional mobility, dynamic standing balance, and overall safety in order to safely discharge home with assistance from her husband and her sister.     Follow Up Recommendations  No OT follow up;Supervision/Assistance - 24 hour    Equipment Recommendations  None recommended by OT    Recommendations for Other Services  None at this time    Precautions / Restrictions Precautions Precautions: Fall;Knee Precaution Comments: reviewed no pillow under knee and benefits of KI Required Braces or Orthoses: Knee Immobilizer - Right Restrictions Weight Bearing Restrictions: Yes RLE Weight Bearing: Weight bearing as tolerated    Mobility Bed Mobility Overal bed mobility: Needs Assistance Bed Mobility: Rolling;Sidelying to Sit Rolling: Supervision Sidelying to sit: Supervision General bed mobility comments: Use of bed rails, cues for technique, supervision for safety  Transfers Overall transfer level: Needs assistance Equipment used: Rolling walker (2 wheeled) Transfers: Sit to/from Stand Sit to Stand: Min assist;Mod assist General transfer comment: Min/mod assist to power up into standing. Cues for hand placement and RLE placement/positioning.     Balance Overall balance assessment: Needs assistance Sitting-balance support: No upper extremity supported;Feet supported Sitting  balance-Leahy Scale: Fair     Standing balance support: Bilateral upper extremity supported;During functional activity Standing balance-Leahy Scale: Poor    ADL Overall ADL's : Needs assistance/impaired Eating/Feeding: Set up;Sitting   Grooming: Min guard;Standing Grooming Details (indicate cue type and reason): at sink Upper Body Bathing: Set up;Sitting   Lower Body Bathing: Minimal assistance;Sit to/from stand   Upper Body Dressing : Set up;Sitting   Lower Body Dressing: Sit to/from stand;Moderate assistance   Toilet Transfer: Minimal assistance;RW;Grab bars;Comfort height toilet   Toileting- Clothing Manipulation and Hygiene: Minimal assistance;Sit to/from stand Toileting - Clothing Manipulation Details (indicate cue type and reason): pt lost balance in standing during peri cleansing, therapist assisted with regaining balance     Functional mobility during ADLs: Minimal assistance;Cueing for safety;Rolling walker General ADL Comments: Pt with loss of balance 3 times during session when standing with RW. Therapist assisted patient regain balance each time. According to RN, pt exhausted and this may be the cause of LOB.     Pertinent Vitals/Pain Pain Assessment: Faces Faces Pain Scale: Hurts little more Pain Location: right knee Pain Descriptors / Indicators: Discomfort;Grimacing;Guarding Pain Intervention(s): Limited activity within patient's tolerance;Monitored during session;Repositioned     Hand Dominance Right   Extremity/Trunk Assessment Upper Extremity Assessment Upper Extremity Assessment: Overall WFL for tasks assessed   Lower Extremity Assessment Lower Extremity Assessment: Defer to PT evaluation   Cervical / Trunk Assessment Cervical / Trunk Assessment: Normal   Communication Communication Communication: No difficulties   Cognition Arousal/Alertness: Awake/alert Behavior During Therapy: WFL for tasks assessed/performed Overall Cognitive Status: Within  Functional Limits for tasks assessed              Home Living Family/patient expects to be discharged to:: Private residence Living Arrangements: Spouse/significant other Available Help at Discharge: Family;Available 24 hours/day (husband  and sister) Type of Home: House Home Access: Level entry     Home Layout: One level     Bathroom Shower/Tub: Tub/shower unit;Curtain   Biochemist, clinical: Standard     Home Equipment: Tub bench;Toilet riser;Walker - 2 wheels;Cane - single point   Prior Functioning/Environment Level of Independence: Independent     OT Diagnosis: Generalized weakness;Acute pain   OT Problem List: Decreased strength;Decreased range of motion;Decreased activity tolerance;Impaired balance (sitting and/or standing);Decreased safety awareness;Pain;Decreased knowledge of use of DME or AE   OT Treatment/Interventions: Self-care/ADL training;Therapeutic exercise;Energy conservation;DME and/or AE instruction;Therapeutic activities;Patient/family education;Balance training    OT Goals(Current goals can be found in the care plan section) Acute Rehab OT Goals Patient Stated Goal: go home tomorrow OT Goal Formulation: With patient Time For Goal Achievement: 07/28/15 Potential to Achieve Goals: Good ADL Goals Pt Will Perform Grooming: with modified independence;standing Pt Will Perform Lower Body Bathing: sit to/from stand;with modified independence Pt Will Perform Lower Body Dressing: with modified independence;sit to/from stand Pt Will Transfer to Toilet: with modified independence;ambulating;bedside commode Pt Will Perform Tub/Shower Transfer: Tub transfer;tub bench;rolling walker;ambulating Additional ADL Goal #1: Pt will be mod I with functional mobility using RW  OT Frequency: Min 2X/week   Barriers to D/C: None known at this time   End of Session Equipment Utilized During Treatment: Gait belt;Rolling walker;Right knee immobilizer CPM Right Knee CPM Right Knee:  Off Nurse Communication: Other (comment);Mobility status (IV pole reading "disconnected")  Activity Tolerance: Patient tolerated treatment well Patient left: in chair;with call bell/phone within reach   Time: 0929-1000 OT Time Calculation (min): 31 min Charges:  OT General Charges $OT Visit: 1 Procedure OT Evaluation $Initial OT Evaluation Tier I: 1 Procedure OT Treatments $Self Care/Home Management : 8-22 mins  Tache Bobst , MS, OTR/L, CLT Pager: [phone]  07/14/2015, 10:17 AM

## 2015-07-14 NOTE — Discharge Instructions (Addendum)
° °Dr. Frank Aluisio °Total Joint Specialist °[facility] °[address] °[place], [place] [address] °[phone] ° °TOTAL KNEE REPLACEMENT POSTOPERATIVE DIRECTIONS ° °Knee Rehabilitation, Guidelines Following Surgery  °Results after knee surgery are often greatly improved when you follow the exercise, range of motion and muscle strengthening exercises prescribed by your doctor. Safety measures are also important to protect the knee from further injury. Any time any of these exercises cause you to have increased pain or swelling in your knee joint, decrease the amount until you are comfortable again and slowly increase them. If you have problems or questions, call your caregiver or physical therapist for advice.  ° °HOME CARE INSTRUCTIONS  °Remove items at home which could result in a fall. This includes throw rugs or furniture in walking pathways.  °· ICE to the affected knee every three hours for 30 minutes at a time and then as needed for pain and swelling.  Continue to use ice on the knee for pain and swelling from surgery. You may notice swelling that will progress down to the foot and ankle.  This is normal after surgery.  Elevate the leg when you are not up walking on it.   °· Continue to use the breathing machine which will help keep your temperature down.  It is common for your temperature to cycle up and down following surgery, especially at night when you are not up moving around and exerting yourself.  The breathing machine keeps your lungs expanded and your temperature down. °· Do not place pillow under knee, focus on keeping the knee straight while resting ° °DIET °You may resume your previous home diet once your are discharged from the hospital. ° °DRESSING / WOUND CARE / SHOWERING °You may shower 3 days after surgery, but keep the wounds dry during showering.  You may use an occlusive plastic wrap (Press'n Seal for example), NO SOAKING/SUBMERGING IN THE BATHTUB.  If the  bandage gets wet, change with a clean dry gauze.  If the incision gets wet, pat the wound dry with a clean towel. °You may start showering once you are discharged home but do not submerge the incision under water. Just pat the incision dry and apply a dry gauze dressing on daily. °Change the surgical dressing daily and reapply a dry dressing each time. ° °ACTIVITY °Walk with your walker as instructed. °Use walker as long as suggested by your caregivers. °Avoid periods of inactivity such as sitting longer than an hour when not asleep. This helps prevent blood clots.  °You may resume a sexual relationship in one month or when given the OK by your doctor.  °You may return to work once you are cleared by your doctor.  °Do not drive a car for 6 weeks or until released by you surgeon.  °Do not drive while taking narcotics. ° °WEIGHT BEARING °Weight bearing as tolerated with assist device (walker, cane, etc) as directed, use it as long as suggested by your surgeon or therapist, typically at least 4-6 weeks. ° °POSTOPERATIVE CONSTIPATION PROTOCOL °Constipation - defined medically as fewer than three stools per week and severe constipation as less than one stool per week. ° °One of the most common issues patients have following surgery is constipation.  Even if you have a regular bowel pattern at home, your normal regimen is likely to be disrupted due to multiple reasons following surgery.  Combination of anesthesia, postoperative narcotics, change in appetite and fluid intake all can affect your bowels.    In order to avoid complications following surgery, here are some recommendations in order to help you during your recovery period. ° °Colace (docusate) - Pick up an over-the-counter form of Colace or another stool softener and take twice a day as long as you are requiring postoperative pain medications.  Take with a full glass of water daily.  If you experience loose stools or diarrhea, hold the colace until you stool forms  back up.  If your symptoms do not get better within 1 week or if they get worse, check with your doctor. ° °Dulcolax (bisacodyl) - Pick up over-the-counter and take as directed by the product packaging as needed to assist with the movement of your bowels.  Take with a full glass of water.  Use this product as needed if not relieved by Colace only.  ° °MiraLax (polyethylene glycol) - Pick up over-the-counter to have on hand.  MiraLax is a solution that will increase the amount of water in your bowels to assist with bowel movements.  Take as directed and can mix with a glass of water, juice, soda, coffee, or tea.  Take if you go more than two days without a movement. °Do not use MiraLax more than once per day. Call your doctor if you are still constipated or irregular after using this medication for 7 days in a row. ° °If you continue to have problems with postoperative constipation, please contact the office for further assistance and recommendations.  If you experience "the worst abdominal pain ever" or develop nausea or vomiting, please contact the office immediatly for further recommendations for treatment. ° °ITCHING ° If you experience itching with your medications, try taking only a single pain pill, or even half a pain pill at a time.  You can also use Benadryl over the counter for itching or also to help with sleep.  ° °TED HOSE STOCKINGS °Wear the elastic stockings on both legs for three weeks following surgery during the day but you may remove then at night for sleeping. ° °MEDICATIONS °See your medication summary on the “After Visit Summary” that the nursing staff will review with you prior to discharge.  You may have some home medications which will be placed on hold until you complete the course of blood thinner medication.  It is important for you to complete the blood thinner medication as prescribed by your surgeon.  Continue your approved medications as instructed at time of  discharge. ° °PRECAUTIONS °If you experience chest pain or shortness of breath - call 911 immediately for transfer to the hospital emergency department.  °If you develop a fever greater that 101 F, purulent drainage from wound, increased redness or drainage from wound, foul odor from the wound/dressing, or calf pain - CONTACT YOUR SURGEON.   °                                                °FOLLOW-UP APPOINTMENTS °Make sure you keep all of your appointments after your operation with your surgeon and caregivers. You should call the office at the above phone number and make an appointment for approximately two weeks after the date of your surgery or on the date instructed by your surgeon outlined in the "After Visit Summary". ° ° °RANGE OF MOTION AND STRENGTHENING EXERCISES  °Rehabilitation of the knee is important following a knee injury or   an operation. After just a few days of immobilization, the muscles of the thigh which control the knee become weakened and shrink (atrophy). Knee exercises are designed to build up the tone and strength of the thigh muscles and to improve knee motion. Often times heat used for twenty to thirty minutes before working out will loosen up your tissues and help with improving the range of motion but do not use heat for the first two weeks following surgery. These exercises can be done on a training (exercise) mat, on the floor, on a table or on a bed. Use what ever works the best and is most comfortable for you Knee exercises include:  °Leg Lifts - While your knee is still immobilized in a splint or cast, you can do straight leg raises. Lift the leg to 60 degrees, hold for 3 sec, and slowly lower the leg. Repeat 10-20 times 2-3 times daily. Perform this exercise against resistance later as your knee gets better.  °Quad and Hamstring Sets - Tighten up the muscle on the front of the thigh (Quad) and hold for 5-10 sec. Repeat this 10-20 times hourly. Hamstring sets are done by pushing the  foot backward against an object and holding for 5-10 sec. Repeat as with quad sets.  °· Leg Slides: Lying on your back, slowly slide your foot toward your buttocks, bending your knee up off the floor (only go as far as is comfortable). Then slowly slide your foot back down until your leg is flat on the floor again. °· Angel Wings: Lying on your back spread your legs to the side as far apart as you can without causing discomfort.  °A rehabilitation program following serious knee injuries can speed recovery and prevent re-injury in the future due to weakened muscles. Contact your doctor or a physical therapist for more information on knee rehabilitation.  ° °IF YOU ARE TRANSFERRED TO A SKILLED REHAB FACILITY °If the patient is transferred to a skilled rehab facility following release from the hospital, a list of the current medications will be sent to the facility for the patient to continue.  When discharged from the skilled rehab facility, please have the facility set up the patient's Home Health Physical Therapy prior to being released. Also, the skilled facility will be responsible for providing the patient with their medications at time of release from the facility to include their pain medication, the muscle relaxants, and their blood thinner medication. If the patient is still at the rehab facility at time of the two week follow up appointment, the skilled rehab facility will also need to assist the patient in arranging follow up appointment in our office and any transportation needs. ° °MAKE SURE YOU:  °Understand these instructions.  °Get help right away if you are not doing well or get worse.  ° ° °Pick up stool softner and laxative for home use following surgery while on pain medications. °Do not submerge incision under water. °Please use good hand washing techniques while changing dressing each day. °May shower starting three days after surgery. °Please use a clean towel to pat the incision dry following  showers. °Continue to use ice for pain and swelling after surgery. °Do not use any lotions or creams on the incision until instructed by your surgeon. ° °Take Xarelto for two and a half more weeks, then discontinue Xarelto. °Once the patient has completed the blood thinner regimen, then take a Baby 81 mg Aspirin daily for three more weeks. ° ° °Information   on my medicine - XARELTO® (Rivaroxaban) ° °This medication education was reviewed with me or my healthcare representative as part of my discharge preparation.  The pharmacist that spoke with me during my hospital stay was:  Summe, Colleen E, RPH ° °Why was Xarelto® prescribed for you? °Xarelto® was prescribed for you to reduce the risk of blood clots forming after orthopedic surgery. The medical term for these abnormal blood clots is venous thromboembolism (VTE). ° °What do you need to know about xarelto® ? °Take your Xarelto® ONCE DAILY at the same time every day. °You may take it either with or without food. ° °If you have difficulty swallowing the tablet whole, you may crush it and mix in applesauce just prior to taking your dose. ° °Take Xarelto® exactly as prescribed by your doctor and DO NOT stop taking Xarelto® without talking to the doctor who prescribed the medication.  Stopping without other VTE prevention medication to take the place of Xarelto® may increase your risk of developing a clot. ° °After discharge, you should have regular check-up appointments with your healthcare provider that is prescribing your Xarelto®.   ° °What do you do if you miss a dose? °If you miss a dose, take it as soon as you remember on the same day then continue your regularly scheduled once daily regimen the next day. Do not take two doses of Xarelto® on the same day.  ° °Important Safety Information °A possible side effect of Xarelto® is bleeding. You should call your healthcare provider right away if you experience any of the following: °? Bleeding from an injury or your  nose that does not stop. °? Unusual colored urine (red or dark brown) or unusual colored stools (red or black). °? Unusual bruising for unknown reasons. °? A serious fall or if you hit your head (even if there is no bleeding). ° °Some medicines may interact with Xarelto® and might increase your risk of bleeding while on Xarelto®. To help avoid this, consult your healthcare provider or pharmacist prior to using any new prescription or non-prescription medications, including herbals, vitamins, non-steroidal anti-inflammatory drugs (NSAIDs) and supplements. ° °This website has more information on Xarelto®: www.xarelto.com. ° ° ° °

## 2015-07-14 NOTE — Progress Notes (Signed)
   Subjective: 1 Day Post-Op Procedure(s) (LRB): RIGHT TOTAL KNEE ARTHROPLASTY (Right) Patient reports pain as moderate.   Patient seen in rounds with Dr. Wynelle Link.  Rough night but better this morning. Patient is well, but has had some minor complaints of pain in the knee, requiring pain medications We will start therapy today.  Plan is to go Home after hospital stay.  Objective: Vital signs in last 24 hours: Temp:  [97.2 F (36.2 C)-98.3 F (36.8 C)] 97.8 F (36.6 C) (11/08 0656) Pulse Rate:  [64-72] 71 (11/08 0656) Resp:  [14-22] 16 (11/08 0656) BP: (106-144)/(50-72) 106/50 mmHg (11/08 0656) SpO2:  [98 %-100 %] 98 % (11/08 0656) Weight:  [88.451 kg (195 lb)] 88.451 kg (195 lb) (11/07 1444)  Intake/Output from previous day:  Intake/Output Summary (Last 24 hours) at 07/14/15 0944 Last data filed at 07/14/15 0600  Gross per 24 hour  Intake   3480 ml  Output   1850 ml  Net   1630 ml    Labs:  Recent Labs  07/14/15 0505  HGB 10.4*    Recent Labs  07/14/15 0505  WBC 7.6  RBC 3.55*  HCT 31.6*  PLT 232    Recent Labs  07/14/15 0505  NA 137  K 4.1  CL 105  CO2 26  BUN 11  CREATININE 0.63  GLUCOSE 160*  CALCIUM 8.4*   No results for input(s): LABPT, INR in the last 72 hours.  EXAM General - Patient is Alert, Appropriate and Oriented Extremity - Neurovascular intact Sensation intact distally Dorsiflexion/Plantar flexion intact Dressing - dressing C/D/I Motor Function - intact, moving foot and toes well on exam.  Hemovac pulled without difficulty.  Past Medical History  Diagnosis Date  . GERD (gastroesophageal reflux disease)   . History of palpitations     NONE IN LAST FEW YEARS   . Arthritis     OA  . 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 Anemia YEARS AGO  . Prediabetes     Assessment/Plan: 1 Day Post-Op Procedure(s) (LRB): RIGHT TOTAL KNEE ARTHROPLASTY (Right) Principal Problem:   OA (osteoarthritis) of  knee  Estimated body mass index is 36.86 kg/(m^2) as calculated from the following:   Height as of this encounter: 5\' 1"  (1.549 m).   Weight as of this encounter: 88.451 kg (195 lb). Up with therapy Plan for discharge tomorrow Discharge home with home health  DVT Prophylaxis - Xarelto Weight-Bearing as tolerated to right leg D/C O2 and Pulse OX and try on Room Air  Arlee Muslim, PA-C Orthopaedic Surgery 07/14/2015, 9:44 AM

## 2015-07-14 NOTE — Progress Notes (Signed)
PT Treatment  Note   07/14/15 1500  PT Visit Information  Last PT Received On 07/14/15  Assistance Needed +1  History of Present Illness 64 yo female s/p R TKA. PMH: GERD, cancer, arthritis, anemia, prediabetes  Precautions  Precautions Fall;Knee  Required Braces or Orthoses Knee Immobilizer - Right  Knee Immobilizer - Right Discontinue once straight leg raise with < 10 degree lag  Restrictions  Weight Bearing Restrictions No  RLE Weight Bearing WBAT  Pain Assessment  Pain Assessment 0-10  Pain Score 6 (Increased to 8 with amb)  Pain Location R knee  Pain Descriptors / Indicators Sore  Pain Intervention(s) Limited activity within patient's tolerance;Monitored during session;Repositioned;Ice applied  Cognition  Arousal/Alertness Awake/alert  Behavior During Therapy WFL for tasks assessed/performed  Overall Cognitive Status Within Functional Limits for tasks assessed  Bed Mobility  Overal bed mobility Needs Assistance  Bed Mobility Supine to Sit;Sit to Supine  Supine to sit Min assist  Sit to supine Min assist  General bed mobility comments min assit with RLE, mod verbal cues with use of B UEs to self-assist with trunk   Transfers  Overall transfer level Needs assistance  Equipment used Rolling walker (2 wheeled)  Transfers Sit to/from Stand  Sit to Stand Min assist  General transfer comment increased time, steady assist, cues for hand placement   Ambulation/Gait  Ambulation/Gait assistance Min assist  Ambulation Distance (Feet) 80 Feet  Assistive device Rolling walker (2 wheeled)  Gait Pattern/deviations Step-to pattern;Antalgic  General Gait Details mod VC's for RW progression, BLE progression, and staying with walker; continues to have very slow paced amb   Gait velocity decreased  Exercises  Exercises Total Joint  Total Joint Exercises  Ankle Circles/Pumps AROM;Both;20 reps  Quad Sets Strengthening;Right;10 reps  Heel Slides AAROM;Right;10 reps  Straight Leg Raises  AAROM;Strengthening;Right;10 reps  Goniometric ROM knee extension lacking 14 degrees, knee flexion 35 degrees  PT - End of Session  Equipment Utilized During Treatment Gait belt;Right knee immobilizer  Activity Tolerance Patient limited by pain  Patient left in bed;with call bell/phone within reach;with bed alarm set;with family/visitor present  PT Plan  PT Frequency (ACUTE ONLY) 7X/week  PT Recommendation  Follow Up Recommendations Home health PT  PT equipment None recommended by PT  Acute Rehab PT Goals  Patient Stated Goal go home tomorrow  PT Goal Formulation With patient  Time For Goal Achievement 07/17/15  Potential to Achieve Goals Good  PT Time Calculation  PT Start Time (ACUTE ONLY) 1500  PT Stop Time (ACUTE ONLY) 1528  PT Time Calculation (min) (ACUTE ONLY) 28 min   Graciemae Delisle, SPT 07/14/2015

## 2015-07-14 NOTE — Evaluation (Signed)
Physical Therapy Evaluation Patient Details Name: Tonya Graves MRN: [id number] DOB: 1951-06-29 Today's Date: 07/14/2015   History of Present Illness  64 yo female s/p R TKA. PMH: GERD, cancer, arthritis, anemia, prediabetes  Clinical Impression  Pt is s/p R TKA resulting in the deficits listed below (see PT Problem List).  Pt will benefit from skilled PT to increase their independence and safety with mobility to allow discharge to the venue listed below.  Pt tolerated short distance ambulation POD #1 and plans to d/c home with spouse/family.     Follow Up Recommendations Home health PT    Equipment Recommendations  None recommended by PT    Recommendations for Other Services       Precautions / Restrictions Precautions Precautions: Fall;Knee Precaution Comments: reviewed no pillow under knee and benefits of KI Required Braces or Orthoses: Knee Immobilizer - Right Knee Immobilizer - Right: Discontinue once straight leg raise with < 10 degree lag Restrictions Weight Bearing Restrictions: Yes RLE Weight Bearing: Weight bearing as tolerated      Mobility  Bed Mobility Overal bed mobility: Needs Assistance Bed Mobility: Sit to Supine Rolling: Supervision Sidelying to sit: Supervision   Sit to supine: Min assist   General bed mobility comments: verbal cues for self assist, required support for R LE onto bed  Transfers Overall transfer level: Needs assistance Equipment used: Rolling walker (2 wheeled) Transfers: Sit to/from Stand Sit to Stand: Min assist         General transfer comment: verbal cues for safe technique  Ambulation/Gait Ambulation/Gait assistance: Min assist Ambulation Distance (Feet): 60 Feet Assistive device: Rolling walker (2 wheeled) Gait Pattern/deviations: Step-to pattern;Antalgic     General Gait Details: verbal cues for sequence, RW positioning, step length, very slow pace  Stairs            Wheelchair Mobility    Modified  Rankin (Stroke Patients Only)       Balance Overall balance assessment: Needs assistance Sitting-balance support: No upper extremity supported;Feet supported Sitting balance-Leahy Scale: Fair     Standing balance support: Bilateral upper extremity supported;During functional activity Standing balance-Leahy Scale: Poor                               Pertinent Vitals/Pain Pain Assessment: 0-10 Pain Score: 6  Faces Pain Scale: Hurts little more Pain Location: R knee Pain Descriptors / Indicators: Sore Pain Intervention(s): Limited activity within patient's tolerance;Monitored during session;Repositioned;Ice applied    Home Living Family/patient expects to be discharged to:: Private residence Living Arrangements: Spouse/significant other Available Help at Discharge: Family;Available 24 hours/day (husband and sister) Type of Home: House Home Access: Level entry     Home Layout: One level Home Equipment: Tub bench;Toilet riser;Walker - 2 wheels;Cane - single point      Prior Function Level of Independence: Independent               Hand Dominance   Dominant Hand: Right    Extremity/Trunk Assessment   Upper Extremity Assessment: Overall WFL for tasks assessed           Lower Extremity Assessment: RLE deficits/detail RLE Deficits / Details: unable to perform SLR, maintained KI, required assist for R LE during bed mobility    Cervical / Trunk Assessment: Normal  Communication   Communication: No difficulties  Cognition Arousal/Alertness: Awake/alert Behavior During Therapy: WFL for tasks assessed/performed Overall Cognitive Status: Within Functional Limits for tasks assessed  General Comments      Exercises        Assessment/Plan    PT Assessment Patient needs continued PT services  PT Diagnosis Difficulty walking   PT Problem List Decreased strength;Decreased mobility;Decreased range of  motion;Pain;Decreased knowledge of use of DME;Decreased knowledge of precautions  PT Treatment Interventions Functional mobility training;Stair training;Gait training;DME instruction;Patient/family education;Therapeutic activities;Therapeutic exercise   PT Goals (Current goals can be found in the Care Plan section) Acute Rehab PT Goals Patient Stated Goal: go home tomorrow PT Goal Formulation: With patient Time For Goal Achievement: 07/17/15 Potential to Achieve Goals: Good    Frequency 7X/week   Barriers to discharge        Co-evaluation               End of Session Equipment Utilized During Treatment: Gait belt;Right knee immobilizer Activity Tolerance: Patient limited by pain Patient left: in bed;with call bell/phone within reach;with bed alarm set           Time: 6244-6950 PT Time Calculation (min) (ACUTE ONLY): 14 min   Charges:   PT Evaluation $Initial PT Evaluation Tier I: 1 Procedure     PT G Codes:        Doil Kamara,KATHrine E 07/14/2015, 12:31 PM [name], PT, DPT 07/14/2015 Pager: [phone]

## 2015-07-14 NOTE — Care Management Note (Signed)
Case Management Note  Patient Details  Name: Tonya Graves MRN: [id number] Date of Birth: 1951/04/03  Subjective/Objective:                  RIGHT TOTAL KNEE ARTHROPLASTY (Right) Action/Plan: Discharge planning Expected Discharge Date:  07/15/15               Expected Discharge Plan:  [place]  In-House Referral:     Discharge planning Services  CM Consult  Post Acute Care Choice:  Home Health Choice offered to:  Patient  DME Arranged:  N/A DME Agency:  NA  HH Arranged:  PT HH Agency:  [facility]  Status of Service:  Completed, signed off  Medicare Important Message Given:    Date Medicare IM Given:    Medicare IM give by:    Date Additional Medicare IM Given:    Additional Medicare Important Message give by:     If discussed at [place] of Stay Meetings, dates discussed:    Additional Comments: Utilization Review Complete. CM met with pt in room to offer choice of home health agency.  Pt chooses [facility] to render HHPT.  Referral given to [facility] rep, Tim (on unit).   Pt has all DME at home.   NO other Cm needs were communicated. Dellie Catholic, RN 07/14/2015, 2:37 PM

## 2015-07-15 LAB — CBC
HCT: 32.1 % — ABNORMAL LOW (ref 36.0–46.0)
Hemoglobin: 10.3 g/dL — ABNORMAL LOW (ref 12.0–15.0)
MCH: 28.9 pg (ref 26.0–34.0)
MCHC: 32.1 g/dL (ref 30.0–36.0)
MCV: 89.9 fL (ref 78.0–100.0)
Platelets: 223 10*3/uL (ref 150–400)
RBC: 3.57 MIL/uL — ABNORMAL LOW (ref 3.87–5.11)
RDW: 13.8 % (ref 11.5–15.5)
WBC: 8.2 10*3/uL (ref 4.0–10.5)

## 2015-07-15 LAB — BASIC METABOLIC PANEL
Anion gap: 5 (ref 5–15)
BUN: 12 mg/dL (ref 6–20)
CO2: 30 mmol/L (ref 22–32)
Calcium: 8.5 mg/dL — ABNORMAL LOW (ref 8.9–10.3)
Chloride: 104 mmol/L (ref 101–111)
Creatinine, Ser: 0.66 mg/dL (ref 0.44–1.00)
GFR calc Af Amer: >60 mL/min (ref >60)
GFR calc non Af Amer: >60 mL/min (ref >60)
Glucose, Bld: 126 mg/dL — ABNORMAL HIGH (ref 65–99)
Potassium: 4.3 mmol/L (ref 3.5–5.1)
Sodium: 139 mmol/L (ref 135–145)

## 2015-07-15 MED ORDER — OXYCODONE HCL 5 MG PO TABS: 5.0000 mg | ORAL_TABLET | ORAL | Status: DC | PRN

## 2015-07-15 MED ORDER — TRAMADOL HCL 50 MG PO TABS: 50.0000 mg | ORAL_TABLET | Freq: Four times a day (QID) | ORAL | Status: DC | PRN

## 2015-07-15 MED ORDER — RIVAROXABAN 10 MG PO TABS: 10.0000 mg | ORAL_TABLET | Freq: Every day | ORAL | Status: DC

## 2015-07-15 MED ORDER — METHOCARBAMOL 500 MG PO TABS: 500.0000 mg | ORAL_TABLET | Freq: Four times a day (QID) | ORAL | Status: DC | PRN

## 2015-07-15 NOTE — Progress Notes (Signed)
   Subjective: 2 Days Post-Op Procedure(s) (LRB): RIGHT TOTAL KNEE ARTHROPLASTY (Right) Patient reports pain as mild.   Patient seen in rounds with Dr. Wynelle Link. Patient is well, and has had no acute complaints or problems Patient is ready to go home today  Objective: Vital signs in last 24 hours: Temp:  [97.9 F (36.6 C)-98.4 F (36.9 C)] 98 F (36.7 C) (11/09 0415) Pulse Rate:  [69-77] 70 (11/09 0415) Resp:  [14-16] 14 (11/09 0415) BP: (129-144)/(60-68) 129/68 mmHg (11/09 0415) SpO2:  [98 %] 98 % (11/09 0415)  Intake/Output from previous day:  Intake/Output Summary (Last 24 hours) at 07/15/15 0705 Last data filed at 07/15/15 0415  Gross per 24 hour  Intake   1525 ml  Output   1100 ml  Net    425 ml     Labs:  Recent Labs  07/14/15 0505 07/15/15 0550  HGB 10.4* 10.3*    Recent Labs  07/14/15 0505 07/15/15 0550  WBC 7.6 8.2  RBC 3.55* 3.57*  HCT 31.6* 32.1*  PLT 232 223    Recent Labs  07/14/15 0505 07/15/15 0550  NA 137 139  K 4.1 4.3  CL 105 104  CO2 26 30  BUN 11 12  CREATININE 0.63 0.66  GLUCOSE 160* 126*  CALCIUM 8.4* 8.5*   No results for input(s): LABPT, INR in the last 72 hours.  EXAM: General - Patient is Alert, Appropriate and Oriented Extremity - Neurovascular intact Sensation intact distally Dorsiflexion/Plantar flexion intact Incision - clean, dry, no drainage Motor Function - intact, moving foot and toes well on exam.   Assessment/Plan: 2 Days Post-Op Procedure(s) (LRB): RIGHT TOTAL KNEE ARTHROPLASTY (Right) Procedure(s) (LRB): RIGHT TOTAL KNEE ARTHROPLASTY (Right) Past Medical History  Diagnosis Date  . GERD (gastroesophageal reflux disease)   . History of palpitations     NONE IN LAST FEW YEARS   . Arthritis     OA  . 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 Anemia YEARS AGO  . Prediabetes    Principal Problem:   OA (osteoarthritis) of knee  Estimated body mass index is 36.86  kg/(m^2) as calculated from the following:   Height as of this encounter: 5\' 1"  (1.549 m).   Weight as of this encounter: 88.451 kg (195 lb). Up with therapy Discharge home with home health Diet - Diabetic diet Follow up - in 2 weeks Activity - WBAT Disposition - Home Condition Upon Discharge - Good D/C Meds - See DC Summary DVT Prophylaxis - Xarelto  Arlee Muslim, PA-C Orthopaedic Surgery 07/15/2015, 7:05 AM

## 2015-07-15 NOTE — Progress Notes (Signed)
Physical Therapy Treatment Patient Details Name: Tonya Graves MRN: [id number] DOB: [birth year] Today's Date: 07/15/2015    History of Present Illness 64 yo female s/p R TKA. PMH: GERD, cancer, arthritis, anemia, prediabetes    PT Comments    POD # 2 am session.  Assisted with amb and performing all supine TKR TE's followed by ICE.  Pt plans to D/C to home today.   Follow Up Recommendations  Home health PT     Equipment Recommendations       Recommendations for Other Services       Precautions / Restrictions Precautions Precautions: Fall;Knee Precaution Comments: reviewed no pillow under knee and benefits of KI Required Braces or Orthoses: Knee Immobilizer - Right Knee Immobilizer - Right: Discontinue once straight leg raise with < 10 degree lag Restrictions Weight Bearing Restrictions: Yes RLE Weight Bearing: Weight bearing as tolerated    Mobility  Bed Mobility Overal bed mobility: Needs Assistance Bed Mobility: Supine to Sit Rolling: Supervision Sidelying to sit: Supervision Supine to sit: Min guard     General bed mobility comments: Pt able to manage RLE, min guard needed for safety. Raised bed up to simulate bed height at home.  Transfers Overall transfer level: Needs assistance Equipment used: Rolling walker (2 wheeled) Transfers: Sit to/from Stand Sit to Stand: Supervision;Min guard         General transfer comment: Cues for hand placement, min guard for safety.   Ambulation/Gait Ambulation/Gait assistance: Supervision;Min guard Ambulation Distance (Feet): 90 Feet Assistive device: Rolling walker (2 wheeled) Gait Pattern/deviations: Step-to pattern Gait velocity: decreased   General Gait Details: slow but steady gait.     Stairs            Wheelchair Mobility    Modified Rankin (Stroke Patients Only)       Balance                                    Cognition Arousal/Alertness: Awake/alert Behavior During  Therapy: WFL for tasks assessed/performed Overall Cognitive Status: Within Functional Limits for tasks assessed                      Exercises   Total Knee Replacement TE's 10 reps B LE ankle pumps 10 reps towel squeezes 10 reps knee presses 10 reps heel slides  10 reps SAQ's 10 reps SLR's 10 reps ABD Followed by ICE     General Comments        Pertinent Vitals/Pain Pain Assessment: 0-10 Pain Score: 5  Pain Location: right knee Pain Descriptors / Indicators: Sore;Tightness;Tender Pain Intervention(s): Monitored during session;Repositioned;Ice applied    Home Living                      Prior Function            PT Goals (current goals can now be found in the care plan section) Progress towards PT goals: Progressing toward goals    Frequency  7X/week    PT Plan Current plan remains appropriate    Co-evaluation             End of Session Equipment Utilized During Treatment: Gait belt;Right knee immobilizer Activity Tolerance: Patient tolerated treatment well Patient left: in chair;with call bell/phone within reach     Time: 1005-1045 PT Time Calculation (min) (ACUTE ONLY): 40 min  Charges:  $Gait Training: 8-22  mins $Therapeutic Exercise: 8-22 mins $Therapeutic Activity: 8-22 mins                    G Codes:      Rica Koyanagi  PTA [facility]  Acute  Rehab Pager      [phone]

## 2015-07-15 NOTE — Discharge Summary (Signed)
Physician Discharge Summary   Patient ID: Tonya Graves MRN: [id number] DOB/AGE: March 28, 1951 64 y.o.  Admit date: 07/13/2015 Discharge date: 07/15/2015  Primary Diagnosis:  Osteoarthritis Right knee(s) Admission Diagnoses:  Past Medical History  Diagnosis Date  . GERD (gastroesophageal reflux disease)   . History of palpitations     NONE IN LAST FEW YEARS   . Arthritis     OA  . 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 Anemia YEARS AGO  . Prediabetes    Discharge Diagnoses:   Principal Problem:   OA (osteoarthritis) of knee  Estimated body mass index is 36.86 kg/(m^2) as calculated from the following:   Height as of this encounter: _0  (1.549 m).   Weight as of this encounter: 88.451 kg (195 lb).  Procedure:  Procedure(s) (LRB): RIGHT TOTAL KNEE ARTHROPLASTY (Right)   Consults: None  HPI: Tonya Graves is a 64 y.o. year old female with end stage OA of her right knee with progressively worsening pain and dysfunction. She has constant pain, with activity and at rest and significant functional deficits with difficulties even with ADLs. She has had extensive non-op management including analgesics, injections of cortisone and viscosupplements, and home exercise program, but remains in significant pain with significant dysfunction.Radiographs show bone on bone arthritis medial and patellofemoral. She presents now for right Total Knee Arthroplasty. Laboratory Data: Admission on 07/13/2015  Component Date Value Ref Range Status  . WBC 07/14/2015 7.6  4.0 - 10.5 K/uL Final  . RBC 07/14/2015 3.55* 3.87 - 5.11 MIL/uL Final  . Hemoglobin 07/14/2015 10.4* 12.0 - 15.0 g/dL Final  . HCT 07/14/2015 31.6* 36.0 - 46.0 % Final  . MCV 07/14/2015 89.0  78.0 - 100.0 fL Final  . MCH 07/14/2015 29.3  26.0 - 34.0 pg Final  . MCHC 07/14/2015 32.9  30.0 - 36.0 g/dL Final  . RDW 07/14/2015 13.6  11.5 - 15.5 % Final  . Platelets 07/14/2015 232  150 - 400 K/uL  Final  . Sodium 07/14/2015 137  135 - 145 mmol/L Final  . Potassium 07/14/2015 4.1  3.5 - 5.1 mmol/L Final  . Chloride 07/14/2015 105  101 - 111 mmol/L Final  . CO2 07/14/2015 26  22 - 32 mmol/L Final  . Glucose, Bld 07/14/2015 160* 65 - 99 mg/dL Final  . BUN 07/14/2015 11  6 - 20 mg/dL Final  . Creatinine, Ser 07/14/2015 0.63  0.44 - 1.00 mg/dL Final  . Calcium 07/14/2015 8.4* 8.9 - 10.3 mg/dL Final  . GFR calc non Af Amer 07/14/2015 >60  >60 mL/min Final  . GFR calc Af Amer 07/14/2015 >60  >60 mL/min Final   Comment: (NOTE) The eGFR has been calculated using the CKD EPI equation. This calculation has not been validated in all clinical situations. eGFR's persistently <60 mL/min signify possible Chronic Kidney Disease.   . Anion gap 07/14/2015 6  5 - 15 Final  . WBC 07/15/2015 8.2  4.0 - 10.5 K/uL Final  . RBC 07/15/2015 3.57* 3.87 - 5.11 MIL/uL Final  . Hemoglobin 07/15/2015 10.3* 12.0 - 15.0 g/dL Final  . HCT 07/15/2015 32.1* 36.0 - 46.0 % Final  . MCV 07/15/2015 89.9  78.0 - 100.0 fL Final  . MCH 07/15/2015 28.9  26.0 - 34.0 pg Final  . MCHC 07/15/2015 32.1  30.0 - 36.0 g/dL Final  . RDW 07/15/2015 13.8  11.5 - 15.5 % Final  . Platelets 07/15/2015 223  150 - 400 K/uL Final  . Sodium 07/15/2015 139  135 - 145 mmol/L Final  . Potassium 07/15/2015 4.3  3.5 - 5.1 mmol/L Final  . Chloride 07/15/2015 104  101 - 111 mmol/L Final  . CO2 07/15/2015 30  22 - 32 mmol/L Final  . Glucose, Bld 07/15/2015 126* 65 - 99 mg/dL Final  . BUN 07/15/2015 12  6 - 20 mg/dL Final  . Creatinine, Ser 07/15/2015 0.66  0.44 - 1.00 mg/dL Final  . Calcium 07/15/2015 8.5* 8.9 - 10.3 mg/dL Final  . GFR calc non Af Amer 07/15/2015 >60  >60 mL/min Final  . GFR calc Af Amer 07/15/2015 >60  >60 mL/min Final   Comment: (NOTE) The eGFR has been calculated using the CKD EPI equation. This calculation has not been validated in all clinical situations. eGFR's persistently <60 mL/min signify possible Chronic  Kidney Disease.   Georgiann Hahn gap 07/15/2015 5  5 - 15 Final  Hospital Outpatient Visit on 07/01/2015  Component Date Value Ref Range Status  . MRSA, PCR 07/01/2015 NEGATIVE  NEGATIVE Final  . Staphylococcus aureus 07/01/2015 NEGATIVE  NEGATIVE Final   Comment:        The Xpert SA Assay (FDA approved for NASAL specimens in patients over 57 years of age), is one component of a comprehensive surveillance program.  Test performance has been validated by [facility] for patients greater than or equal to 63 year old. It is not intended to diagnose infection nor to guide or monitor treatment.   Marland Kitchen aPTT 07/01/2015 31  24 - 37 seconds Final  . WBC 07/01/2015 4.2  4.0 - 10.5 K/uL Final  . RBC 07/01/2015 4.07  3.87 - 5.11 MIL/uL Final  . Hemoglobin 07/01/2015 11.7* 12.0 - 15.0 g/dL Final  . HCT 07/01/2015 37.3  36.0 - 46.0 % Final  . MCV 07/01/2015 91.6  78.0 - 100.0 fL Final  . MCH 07/01/2015 28.7  26.0 - 34.0 pg Final  . MCHC 07/01/2015 31.4  30.0 - 36.0 g/dL Final  . RDW 07/01/2015 13.8  11.5 - 15.5 % Final  . Platelets 07/01/2015 257  150 - 400 K/uL Final  . Sodium 07/01/2015 142  135 - 145 mmol/L Final  . Potassium 07/01/2015 4.2  3.5 - 5.1 mmol/L Final  . Chloride 07/01/2015 106  101 - 111 mmol/L Final  . CO2 07/01/2015 27  22 - 32 mmol/L Final  . Glucose, Bld 07/01/2015 112* 65 - 99 mg/dL Final  . BUN 07/01/2015 19  6 - 20 mg/dL Final  . Creatinine, Ser 07/01/2015 0.76  0.44 - 1.00 mg/dL Final  . Calcium 07/01/2015 9.1  8.9 - 10.3 mg/dL Final  . Total Protein 07/01/2015 7.3  6.5 - 8.1 g/dL Final  . Albumin 07/01/2015 3.6  3.5 - 5.0 g/dL Final  . AST 07/01/2015 22  15 - 41 U/L Final  . ALT 07/01/2015 16  14 - 54 U/L Final  . Alkaline Phosphatase 07/01/2015 63  38 - 126 U/L Final  . Total Bilirubin 07/01/2015 0.5  0.3 - 1.2 mg/dL Final  . GFR calc non Af Amer 07/01/2015 >60  >60 mL/min Final  . GFR calc Af Amer 07/01/2015 >60  >60 mL/min Final   Comment: (NOTE) The eGFR has been  calculated using the CKD EPI equation. This calculation has not been validated in all clinical situations. eGFR's persistently <60 mL/min signify possible Chronic Kidney Disease.   . Anion gap 07/01/2015 9  5 - 15 Final  .  Prothrombin Time 07/01/2015 13.2  11.6 - 15.2 seconds Final  . INR 07/01/2015 0.98  0.00 - 1.49 Final  . ABO/RH(D) 07/01/2015 B NEG   Final  . Antibody Screen 07/01/2015 NEG   Final  . Sample Expiration 07/01/2015 07/15/2015   Final  . Extend sample reason 07/01/2015 NO TRANSFUSIONS OR PREGNANCY IN THE PAST 3 MONTHS   Final  . Color, Urine 07/01/2015 YELLOW  YELLOW Final  . APPearance 07/01/2015 CLEAR  CLEAR Final  . Specific Gravity, Urine 07/01/2015 1.012  1.005 - 1.030 Final  . pH 07/01/2015 7.0  5.0 - 8.0 Final  . Glucose, UA 07/01/2015 NEGATIVE  NEGATIVE mg/dL Final  . Hgb urine dipstick 07/01/2015 NEGATIVE  NEGATIVE Final  . Bilirubin Urine 07/01/2015 NEGATIVE  NEGATIVE Final  . Ketones, ur 07/01/2015 NEGATIVE  NEGATIVE mg/dL Final  . Protein, ur 07/01/2015 NEGATIVE  NEGATIVE mg/dL Final  . Urobilinogen, UA 07/01/2015 0.2  0.0 - 1.0 mg/dL Final  . Nitrite 07/01/2015 NEGATIVE  NEGATIVE Final  . Leukocytes, UA 07/01/2015 NEGATIVE  NEGATIVE Final   MICROSCOPIC NOT DONE ON URINES WITH NEGATIVE PROTEIN, BLOOD, LEUKOCYTES, NITRITE, OR GLUCOSE <1000 mg/dL.  . ABO/RH(D) 07/01/2015 B NEG   Final     X-Rays:No results found.  EKG:No orders found for this or any previous visit.   Hospital Course: Tonya Graves is a 64 y.o. who was admitted to [facility]. They were brought to the operating room on 07/13/2015 and underwent Procedure(s): RIGHT TOTAL KNEE ARTHROPLASTY.  Patient tolerated the procedure well and was later transferred to the recovery room and then to the orthopaedic floor for postoperative care.  They were given PO and IV analgesics for pain control following their surgery.  They were given 24 hours of postoperative antibiotics of  Anti-infectives     Start     Dose/Rate Route Frequency Ordered Stop   07/13/15 1830  ceFAZolin (ANCEF) IVPB 2 g/50 mL premix     2 g 100 mL/hr over 30 Minutes Intravenous Every 6 hours 07/13/15 1511 07/14/15 0019   07/13/15 0932  ceFAZolin (ANCEF) IVPB 2 g/50 mL premix     2 g 100 mL/hr over 30 Minutes Intravenous On call to O.R. 07/13/15 0932 07/13/15 1225     and started on DVT prophylaxis in the form of Xarelto.   PT and OT were ordered for total joint protocol.  Discharge planning consulted to help with postop disposition and equipment needs.  Patient had a rough night on the evening of surgery but better the next morning.  They started to get up OOB with therapy on day one. Hemovac drain was pulled without difficulty.  Continued to work with therapy into day two.  Dressing was changed on day two and the incision was healing well. Patient was seen in rounds and was ready to go home.  Discharge home with home health Diet - Diabetic diet Follow up - in 2 weeks Activity - WBAT Disposition - Home Condition Upon Discharge - Good D/C Meds - See DC Summary DVT Prophylaxis - Xarelto  Discharge Instructions    Call MD / Call 911    Complete by:  As directed   If you experience chest pain or shortness of breath, CALL 911 and be transported to the hospital emergency room.  If you develope a fever above 101 F, pus (white drainage) or increased drainage or redness at the wound, or calf pain, call your surgeon's office.     Change dressing  Complete by:  As directed   Change dressing daily with sterile 4 x 4 inch gauze dressing and apply TED hose. Do not submerge the incision under water.     Constipation Prevention    Complete by:  As directed   Drink plenty of fluids.  Prune juice may be helpful.  You may use a stool softener, such as Colace (over the counter) 100 mg twice a day.  Use MiraLax (over the counter) for constipation as needed.     Diet Carb Modified    Complete by:  As directed      Discharge  instructions    Complete by:  As directed   Pick up stool softner and laxative for home use following surgery while on pain medications. Do not submerge incision under water. Please use good hand washing techniques while changing dressing each day. May shower starting three days after surgery. Please use a clean towel to pat the incision dry following showers. Continue to use ice for pain and swelling after surgery. Do not use any lotions or creams on the incision until instructed by your surgeon.  Take Xarelto for two and a half more weeks, then discontinue Xarelto. Once the patient has completed the blood thinner regimen, then take a Baby 81 mg Aspirin daily for three more weeks.  Postoperative Constipation Protocol  Constipation - defined medically as fewer than three stools per week and severe constipation as less than one stool per week.  One of the most common issues patients have following surgery is constipation.  Even if you have a regular bowel pattern at home, your normal regimen is likely to be disrupted due to multiple reasons following surgery.  Combination of anesthesia, postoperative narcotics, change in appetite and fluid intake all can affect your bowels.  In order to avoid complications following surgery, here are some recommendations in order to help you during your recovery period.  Colace (docusate) - Pick up an over-the-counter form of Colace or another stool softener and take twice a day as long as you are requiring postoperative pain medications.  Take with a full glass of water daily.  If you experience loose stools or diarrhea, hold the colace until you stool forms back up.  If your symptoms do not get better within 1 week or if they get worse, check with your doctor.  Dulcolax (bisacodyl) - Pick up over-the-counter and take as directed by the product packaging as needed to assist with the movement of your bowels.  Take with a full glass of water.  Use this product as  needed if not relieved by Colace only.   MiraLax (polyethylene glycol) - Pick up over-the-counter to have on hand.  MiraLax is a solution that will increase the amount of water in your bowels to assist with bowel movements.  Take as directed and can mix with a glass of water, juice, soda, coffee, or tea.  Take if you go more than two days without a movement. Do not use MiraLax more than once per day. Call your doctor if you are still constipated or irregular after using this medication for 7 days in a row.  If you continue to have problems with postoperative constipation, please contact the office for further assistance and recommendations.  If you experience "the worst abdominal pain ever" or develop nausea or vomiting, please contact the office immediatly for further recommendations for treatment.     Do not put a pillow under the knee. Place it under the heel.  Complete by:  As directed      Do not sit on low chairs, stoools or toilet seats, as it may be difficult to get up from low surfaces    Complete by:  As directed      Driving restrictions    Complete by:  As directed   No driving until released by the physician.     Increase activity slowly as tolerated    Complete by:  As directed      Lifting restrictions    Complete by:  As directed   No lifting until released by the physician.     Patient may shower    Complete by:  As directed   You may shower without a dressing once there is no drainage.  Do not wash over the wound.  If drainage remains, do not shower until drainage stops.     TED hose    Complete by:  As directed   Use stockings (TED hose) for 3 weeks on both leg(s).  You may remove them at night for sleeping.     Weight bearing as tolerated    Complete by:  As directed   Laterality:  right  Extremity:  Lower            Medication List    STOP taking these medications        CALCIUM 600 + D PO     HYDROcodone-acetaminophen 5-325 MG tablet  Commonly known as:   NORCO/VICODIN     ibuprofen 600 MG tablet  Commonly known as:  ADVIL,MOTRIN     multivitamins ther. w/minerals Tabs tablet     naproxen sodium 220 MG tablet  Commonly known as:  ANAPROX     RED YEAST RICE PO      TAKE these medications        methocarbamol 500 MG tablet  Commonly known as:  ROBAXIN  Take 1 tablet (500 mg total) by mouth every 6 (six) hours as needed for muscle spasms.     methylcellulose oral powder  Take 1 packet by mouth daily.     omeprazole 20 MG capsule  Commonly known as:  PRILOSEC  Take 20 mg by mouth daily.     oxyCODONE 5 MG immediate release tablet  Commonly known as:  Oxy IR/ROXICODONE  Take 1-2 tablets (5-10 mg total) by mouth every 3 (three) hours as needed for moderate pain or severe pain.     rivaroxaban 10 MG Tabs tablet  Commonly known as:  XARELTO  Take 1 tablet (10 mg total) by mouth daily with breakfast. Take Xarelto for two and a half more weeks, then discontinue Xarelto. Once the patient has completed the blood thinner regimen, then take a Baby 81 mg Aspirin daily for three more weeks.     SYSTANE OP  Place 1 drop into both eyes 2 (two) times daily.     traMADol 50 MG tablet  Commonly known as:  ULTRAM  Take 1-2 tablets (50-100 mg total) by mouth every 6 (six) hours as needed (mild pain).           Follow-up Information    Follow up with [facility].   Why:  home health physical therapy   Contact information:   [address] [place] [address] [phone]       Follow up with Gearlean Alf, MD. Schedule an appointment as soon as possible for a visit on 07/28/2015.   Specialty:  Orthopedic Surgery  Why:  Call office at [phone] to setup appointment on Tuesday 07/28/2015 with Dr. Wynelle Link.   Contact information:   [address] [place] [address] [phone]       Signed: Arlee Muslim, PA-C Orthopaedic Surgery 07/15/2015, 7:57 AM

## 2015-07-15 NOTE — Progress Notes (Signed)
Occupational Therapy Treatment Patient Details Name: Tonya Graves MRN: [id number] DOB: 29-Mar-1951 Today's Date: 07/15/2015    History of present illness 64 yo female s/p R TKA. PMH: GERD, cancer, arthritis, anemia, prediabetes   OT comments  Patient progressing towards OT goals, continue plan of care for now. Pt overall close supervision to min guard for functional mobility and continues to require up to mod assist for LB ADLs. Encouraged and educated patient on importance of having someone present for all mobility at this time for her safety, pt agreed.    Follow Up Recommendations  No OT follow up;Supervision/Assistance - 24 hour    Equipment Recommendations  None recommended by OT    Recommendations for Other Services  None at this time   Precautions / Restrictions Precautions Precautions: Fall;Knee Precaution Comments: reviewed no pillow under knee and benefits of KI Required Braces or Orthoses: Knee Immobilizer - Right Knee Immobilizer - Right: Discontinue once straight leg raise with < 10 degree lag Restrictions Weight Bearing Restrictions: Yes RLE Weight Bearing: Weight bearing as tolerated     Mobility Bed Mobility Overal bed mobility: Needs Assistance Bed Mobility: Supine to Sit     Supine to sit: Min guard     General bed mobility comments: Pt able to manage RLE, min guard needed for safety. Raised bed up to simulate bed height at home.  Transfers Overall transfer level: Needs assistance Equipment used: Rolling walker (2 wheeled) Transfers: Sit to/from Stand Sit to Stand: Min guard General transfer comment: Cues for hand placement, min guard for safety.     Balance Overall balance assessment: Needs assistance Sitting-balance support: No upper extremity supported;Feet supported Sitting balance-Leahy Scale: Good     Standing balance support: Bilateral upper extremity supported;During functional activity Standing balance-Leahy Scale: Fair    ADL  Overall ADL's : Needs assistance/impaired Eating/Feeding: Set up;Sitting   Grooming: Supervision/safety;Standing   Upper Body Bathing: Set up;Sitting   Lower Body Bathing: Minimal assistance;Sit to/from stand   Upper Body Dressing : Set up;Sitting   Lower Body Dressing: Sit to/from stand;Moderate assistance   Toilet Transfer: Minimal assistance;RW;Grab bars;Comfort height toilet   Toileting- Clothing Manipulation and Hygiene: Min guard;Sit to/from stand         General ADL Comments: Pt with no LOB activity today. Pt overall close supervision to min guard, explained importance of patient having someone with her for all mobility for her own safety. Pt has a tub bench she plans to use in tub/shower combo and states she has a BSC she plans to use over toilet seat.      Cognition   Behavior During Therapy: WFL for tasks assessed/performed Overall Cognitive Status: Within Functional Limits for tasks assessed                 Pertinent Vitals/ Pain       Pain Assessment: Faces Faces Pain Scale: Hurts little more Pain Location: right knee Pain Descriptors / Indicators: Sore;Discomfort Pain Intervention(s): Monitored during session;Repositioned;Ice applied   Frequency Min 2X/week     Progress Toward Goals  OT Goals(current goals can now befound in the care plan section)  Progress towards OT goals: Progressing toward goals     Plan Discharge plan remains appropriate    End of Session Equipment Utilized During Treatment: Gait belt;Rolling walker;Right knee immobilizer CPM Right Knee CPM Right Knee: Off   Activity Tolerance Patient tolerated treatment well   Patient Left in chair;with call bell/phone within reach     Time: 0912-0933  OT Time Calculation (min): 21 min  Charges: OT General Charges $OT Visit: 1 Procedure OT Treatments $Self Care/Home Management : 8-22 mins  Loy Mccartt , MS, OTR/L, CLT Pager: [phone]  07/15/2015, 9:44 AM

## 2016-03-21 ENCOUNTER — Other Ambulatory Visit: Payer: Self-pay | Admitting: Obstetrics & Gynecology

## 2016-03-22 LAB — CYTOLOGY - PAP

## 2016-05-11 ENCOUNTER — Ambulatory Visit: Payer: Self-pay | Admitting: Orthopedic Surgery

## 2016-05-27 ENCOUNTER — Encounter (HOSPITAL_COMMUNITY): Payer: Self-pay

## 2016-05-27 ENCOUNTER — Encounter (HOSPITAL_COMMUNITY)
Admission: RE | Admit: 2016-05-27 | Discharge: 2016-05-27 | Disposition: A | Discharge status: Home / Self Care | Payer: BLUE CROSS/BLUE SHIELD | Source: Ambulatory Visit | Attending: Orthopedic Surgery | Admitting: Orthopedic Surgery

## 2016-05-27 DIAGNOSIS — Z01812 Encounter for preprocedural laboratory examination: Secondary | ICD-10-CM | POA: Insufficient documentation

## 2016-05-27 DIAGNOSIS — M1712 Unilateral primary osteoarthritis, left knee: Secondary | ICD-10-CM | POA: Diagnosis not present

## 2016-05-27 HISTORY — DX: Asymptomatic varicose veins of unspecified lower extremity: I83.90

## 2016-05-27 HISTORY — DX: Unspecified cataract: H26.9

## 2016-05-27 HISTORY — DX: Bronchitis, not specified as acute or chronic: J40

## 2016-05-27 HISTORY — DX: Pure hypercholesterolemia, unspecified: E78.00

## 2016-05-27 HISTORY — DX: Other specified disorders of bone density and structure, unspecified site: M85.80

## 2016-05-27 HISTORY — DX: Urinary tract infection, site not specified: N39.0

## 2016-05-27 HISTORY — DX: Frequency of micturition: R35.0

## 2016-05-27 HISTORY — DX: Personal history of other diseases of the digestive system: Z87.19

## 2016-05-27 HISTORY — DX: Palpitations: R00.2

## 2016-05-27 LAB — CBC
HCT: 37.4 % (ref 36.0–46.0)
Hemoglobin: 11.9 g/dL — ABNORMAL LOW (ref 12.0–15.0)
MCH: 28.2 pg (ref 26.0–34.0)
MCHC: 31.8 g/dL (ref 30.0–36.0)
MCV: 88.6 fL (ref 78.0–100.0)
Platelets: 254 10*3/uL (ref 150–400)
RBC: 4.22 MIL/uL (ref 3.87–5.11)
RDW: 14.7 % (ref 11.5–15.5)
WBC: 4.8 10*3/uL (ref 4.0–10.5)

## 2016-05-27 LAB — COMPREHENSIVE METABOLIC PANEL
ALT: 17 U/L (ref 14–54)
AST: 22 U/L (ref 15–41)
Albumin: 3.9 g/dL (ref 3.5–5.0)
Alkaline Phosphatase: 55 U/L (ref 38–126)
Anion gap: 5 (ref 5–15)
BUN: 23 mg/dL — ABNORMAL HIGH (ref 6–20)
CO2: 27 mmol/L (ref 22–32)
Calcium: 9 mg/dL (ref 8.9–10.3)
Chloride: 108 mmol/L (ref 101–111)
Creatinine, Ser: 0.72 mg/dL (ref 0.44–1.00)
GFR calc Af Amer: >60 mL/min (ref >60)
GFR calc non Af Amer: >60 mL/min (ref >60)
Glucose, Bld: 92 mg/dL (ref 65–99)
Potassium: 4.4 mmol/L (ref 3.5–5.1)
Sodium: 140 mmol/L (ref 135–145)
Total Bilirubin: 0.4 mg/dL (ref 0.3–1.2)
Total Protein: 7.1 g/dL (ref 6.5–8.1)

## 2016-05-27 LAB — APTT: aPTT: 31 seconds (ref 24–36)

## 2016-05-27 LAB — URINALYSIS, ROUTINE W REFLEX MICROSCOPIC
Bilirubin Urine: NEGATIVE
Glucose, UA: NEGATIVE mg/dL
Hgb urine dipstick: NEGATIVE
Ketones, ur: NEGATIVE mg/dL
Leukocytes, UA: NEGATIVE
Nitrite: NEGATIVE
Protein, ur: NEGATIVE mg/dL
Specific Gravity, Urine: 1.022 (ref 1.005–1.030)
pH: 6 (ref 5.0–8.0)

## 2016-05-27 LAB — SURGICAL PCR SCREEN
MRSA, PCR: NEGATIVE
Staphylococcus aureus: NEGATIVE

## 2016-05-27 LAB — PROTIME-INR
INR: 1.03
Prothrombin Time: 13.5 seconds (ref 11.4–15.2)

## 2016-05-27 NOTE — Pre-Procedure Instructions (Signed)
EKG 05-24-16, on chart

## 2016-05-27 NOTE — Pre-Procedure Instructions (Addendum)
2V CXR 05-24-16 on chart Clearance, Dr. Tobie Poet, on chart

## 2016-05-27 NOTE — Patient Instructions (Addendum)
Tonya Graves  05/27/2016   Your procedure is scheduled on: 06/06/16   Report to [facility]  Entrance take [facility]  elevators to 3rd floor to  [place] at 6:25 AM.  Call this number if you have problems the morning of surgery [phone]   Remember: ONLY 1 PERSON MAY GO WITH YOU TO SHORT STAY TO GET  READY MORNING OF [name]  Do not eat food or drink liquids :After Midnight.     Take these medicines the morning of surgery with A SIP OF WATER: Omeprazole (Prilosec),  eye drops if needed.                               You may not have any metal on your body including hair pins and              piercings  Do not wear jewelry, make-up, lotions, powders or perfumes, deodorant             Do not wear nail polish.  Do not shave  48 hours prior to surgery.              Men may shave face and neck.   Do not bring valuables to the hospital. [place].  Contacts, dentures or bridgework may not be worn into surgery.  Leave suitcase in the car. After surgery it may be brought to your room.              Please read over the following fact sheets you were given: _____________________________________________________________________             [facility] - Preparing for Surgery Before surgery, you can play an important role.  Because skin is not sterile, your skin needs to be as free of germs as possible.  You can reduce the number of germs on your skin by washing with CHG (chlorahexidine gluconate) soap before surgery.  CHG is an antiseptic cleaner which kills germs and bonds with the skin to continue killing germs even after washing. Please DO NOT use if you have an allergy to CHG or antibacterial soaps.  If your skin becomes reddened/irritated stop using the CHG and inform your nurse when you arrive at Short Stay. Do not shave (including legs and underarms) for at least 48 hours prior to the first CHG  shower.  You may shave your face/neck. Please follow these instructions carefully:  1.  Shower with CHG Soap the night before surgery and the  morning of Surgery.  2.  If you choose to wash your hair, wash your hair first as usual with your  normal  shampoo.  3.  After you shampoo, rinse your hair and body thoroughly to remove the  shampoo.                           4.  Use CHG as you would any other liquid soap.  You can apply chg directly  to the skin and wash                       Gently with a scrungie or clean washcloth.  5.  Apply the  CHG Soap to your body ONLY FROM THE NECK DOWN.   Do not use on face/ open                           Wound or open sores. Avoid contact with eyes, ears mouth and genitals (private parts).                       Wash face,  Genitals (private parts) with your normal soap.             6.  Wash thoroughly, paying special attention to the area where your surgery  will be performed.  7.  Thoroughly rinse your body with warm water from the neck down.  8.  DO NOT shower/wash with your normal soap after using and rinsing off  the CHG Soap.                9.  Pat yourself dry with a clean towel.            10.  Wear clean pajamas.            11.  Place clean sheets on your bed the night of your first shower and do not  sleep with pets. Day of Surgery : Do not apply any lotions/deodorants the morning of surgery.  Please wear clean clothes to the hospital/surgery center.  FAILURE TO FOLLOW THESE INSTRUCTIONS MAY RESULT IN THE CANCELLATION OF YOUR SURGERY PATIENT SIGNATURE_________________________________  NURSE SIGNATURE__________________________________  ________________________________________________________________________   Tonya Graves  An incentive spirometer is a tool that can help keep your lungs clear and active. This tool measures how well you are filling your lungs with each breath. Taking long deep breaths may help reverse or decrease the chance  of developing breathing (pulmonary) problems (especially infection) following:  A long period of time when you are unable to move or be active. BEFORE THE PROCEDURE   If the spirometer includes an indicator to show your best effort, your nurse or respiratory therapist will set it to a desired goal.  If possible, sit up straight or lean slightly forward. Try not to slouch.  Hold the incentive spirometer in an upright position. INSTRUCTIONS FOR USE  1. Sit on the edge of your bed if possible, or sit up as far as you can in bed or on a chair. 2. Hold the incentive spirometer in an upright position. 3. Breathe out normally. 4. Place the mouthpiece in your mouth and seal your lips tightly around it. 5. Breathe in slowly and as deeply as possible, raising the piston or the ball toward the top of the column. 6. Hold your breath for 3-5 seconds or for as long as possible. Allow the piston or ball to fall to the bottom of the column. 7. Remove the mouthpiece from your mouth and breathe out normally. 8. Rest for a few seconds and repeat Steps 1 through 7 at least 10 times every 1-2 hours when you are awake. Take your time and take a few normal breaths between deep breaths. 9. The spirometer may include an indicator to show your best effort. Use the indicator as a goal to work toward during each repetition. 10. After each set of 10 deep breaths, practice coughing to be sure your lungs are clear. If you have an incision (the cut made at the time of surgery), support your incision when coughing by placing a pillow or rolled up  towels firmly against it. Once you are able to get out of bed, walk around indoors and cough well. You may stop using the incentive spirometer when instructed by your caregiver.  RISKS AND COMPLICATIONS  Take your time so you do not get dizzy or light-headed.  If you are in pain, you may need to take or ask for pain medication before doing incentive spirometry. It is harder to  take a deep breath if you are having pain. AFTER USE  Rest and breathe slowly and easily.  It can be helpful to keep track of a log of your progress. Your caregiver can provide you with a simple table to help with this. If you are using the spirometer at home, follow these instructions: [place] IF:   You are having difficultly using the spirometer.  You have trouble using the spirometer as often as instructed.  Your pain medication is not giving enough relief while using the spirometer.  You develop fever of 100.5 F (38.1 C) or higher. SEEK IMMEDIATE MEDICAL CARE IF:   You cough up bloody sputum that had not been present before.  You develop fever of 102 F (38.9 C) or greater.  You develop worsening pain at or near the incision site. MAKE SURE YOU:   Understand these instructions.  Will watch your condition.  Will get help right away if you are not doing well or get worse. Document Released: 01/02/2007 Document Revised: 11/14/2011 Document Reviewed: 03/05/2007 Exit[employer] Patient Information 2014 Exit[employer], [place].   ________________________________________________________________________  WHAT IS A BLOOD TRANSFUSION? Blood Transfusion Information  A transfusion is the replacement of blood or some of its parts. Blood is made up of multiple cells which provide different functions.  Red blood cells carry oxygen and are used for blood loss replacement.  White blood cells fight against infection.  Platelets control bleeding.  Plasma helps clot blood.  Other blood products are available for specialized needs, such as hemophilia or other clotting disorders. BEFORE THE TRANSFUSION  Who gives blood for transfusions?   Healthy volunteers who are fully evaluated to make sure their blood is safe. This is blood bank blood. Transfusion therapy is the safest it has ever been in the practice of medicine. Before blood is taken from a donor, a complete history is taken to  make sure that person has no history of diseases nor engages in risky social behavior (examples are intravenous drug use or sexual activity with multiple partners). The donor's travel history is screened to minimize risk of transmitting infections, such as malaria. The donated blood is tested for signs of infectious diseases, such as HIV and hepatitis. The blood is then tested to be sure it is compatible with you in order to minimize the chance of a transfusion reaction. If you or a relative donates blood, this is often done in anticipation of surgery and is not appropriate for emergency situations. It takes many days to process the donated blood. RISKS AND COMPLICATIONS Although transfusion therapy is very safe and saves many lives, the main dangers of transfusion include:   Getting an infectious disease.  Developing a transfusion reaction. This is an allergic reaction to something in the blood you were given. Every precaution is taken to prevent this. The decision to have a blood transfusion has been considered carefully by your caregiver before blood is given. Blood is not given unless the benefits outweigh the risks. AFTER THE TRANSFUSION  Right after receiving a blood transfusion, you will usually feel much  better and more energetic. This is especially true if your red blood cells have gotten low (anemic). The transfusion raises the level of the red blood cells which carry oxygen, and this usually causes an energy increase.  The nurse administering the transfusion will monitor you carefully for complications. HOME CARE INSTRUCTIONS  No special instructions are needed after a transfusion. You may find your energy is better. Speak with your caregiver about any limitations on activity for underlying diseases you may have. SEEK MEDICAL CARE IF:   Your condition is not improving after your transfusion.  You develop redness or irritation at the intravenous (IV) site. SEEK IMMEDIATE MEDICAL CARE  IF:  Any of the following symptoms occur over the next 12 hours:  Shaking chills.  You have a temperature by mouth above 102 F (38.9 C), not controlled by medicine.  Chest, back, or muscle pain.  People around you feel you are not acting correctly or are confused.  Shortness of breath or difficulty breathing.  Dizziness and fainting.  You get a rash or develop hives.  You have a decrease in urine output.  Your urine turns a dark color or changes to pink, red, or brown. Any of the following symptoms occur over the next 10 days:  You have a temperature by mouth above 102 F (38.9 C), not controlled by medicine.  Shortness of breath.  Weakness after normal activity.  The white part of the eye turns yellow (jaundice).  You have a decrease in the amount of urine or are urinating less often.  Your urine turns a dark color or changes to pink, red, or brown. Document Released: 08/19/2000 Document Revised: 11/14/2011 Document Reviewed: 04/07/2008 [facility] Patient Information 2014 [place], [place].  _______________________________________________________________________

## 2016-06-05 ENCOUNTER — Ambulatory Visit: Payer: Self-pay | Admitting: Orthopedic Surgery

## 2016-06-05 NOTE — H&P (Signed)
Tonya Graves DOB: 04-14-1951 Married / Language: English / Race: White Female Date of Admission:  06/06/2016 CC:  Left Knee Pain History of Present Illness The patient is a 65 year old female who comes in for a preoperative History and Physical. The patient is scheduled for a left total knee arthroplasty to be performed by Dr. Dione Plover. Aluisio, MD at [facility] on 06/06/2016. The patient is a 65 year old female who is months out from right total knee arthroplasty. The patient states that she is doing well at this time. control at this time and describe their pain as mild. They are currently on no medication for their pain. The patient is currently doing home exercise program. The patient feels that they are progressing well at this time. Note for "Post TKA": Patient states that she has been doing her home exercises, but does not feel like she has improved much since her last visit. Her right knee is doing very well at this time. It is the left knee which giving her most trouble. She has had injections in the left knee. She has documented advanced arthritis in that knee. She feels as though the knee is really limiting what she can and cannot do. She is ready to go ahead and discuss getting that knee fixed. They have been treated conservatively in the past for the above stated problem and despite conservative measures, they continue to have progressive pain and severe functional limitations and dysfunction. They have failed non-operative management including home exercise, medications. It is felt that they would benefit from undergoing total joint replacement. Risks and benefits of the procedure have been discussed with the patient and they elect to proceed with surgery. There are no active contraindications to surgery such as ongoing infection or rapidly progressive neurological disease.  Problem List/Past Medical  Primary osteoarthritis of right knee (M17.11)  Status post total right knee  replacement [id number])  Osteoarthritis  Hypercholesterolemia  Hiatal Hernia  Gastroesophageal Reflux Disease  Cataract  Bronchitis  Past History Varicose veins  Anemia  Past History Menopause  Measles  Mumps  Rubella  Eczema  Allergies No Known Drug Allergies  Family History Cerebrovascular Accident  Father, Paternal Grandmother. Heart Disease  Paternal Grandfather. Severe allergy  Sister. Cancer  Father, Maternal Grandmother, Sister. Hypertension  Mother. Osteoarthritis  Mother, Sister. Osteoporosis  Paternal Grandmother.  Social History Tobacco use  Former smoker. 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 Red Yeast Rice (Oral) Specific strength unknown - Active. Biotin (Oral daily) Specific strength unknown - Active. Multivitamin (Oral) Active. Calcium-Vitamin D (600-200MG -UNIT Tablet, 2 Oral daily) Active. Aleve (220MG  Tablet, Oral as needed) Active. Terbinafine HCl (250MG  Tablet, Oral daily) Active.   Past Surgical History  Cesarean Delivery  2 times Total Knee Replacement - Right [07/13/2015]: Colon Polyp Removal - Colonoscopy     Review of Systems General Not Present- Chills, Fatigue, Fever, Memory Loss, Night Sweats, Weight Gain and Weight Loss. Skin Not Present- Eczema, Hives, Itching, Lesions and Rash. HEENT Not Present- Dentures, Double Vision, Headache, Hearing Loss, Tinnitus and Visual Loss. Respiratory Present- Shortness of breath with  exertion. Not Present- Allergies, Chronic Cough, Coughing up blood and Shortness of breath  at rest. Cardiovascular Present- Palpitations. Not Present- Chest Pain, Difficulty Breathing Lying Down, Murmur, Racing/skipping heartbeats and Swelling. Gastrointestinal Present- Constipation and Heartburn. Not Present- Abdominal Pain, Bloody Stool, Diarrhea, Difficulty Swallowing, Jaundice, Loss of appetitie, Nausea and Vomiting. Female Genitourinary Present- Urinary frequency and Urinating at Night. Not Present- Blood in Urine, Discharge, Flank Pain, Incontinence, Painful Urination, Urgency, Urinary Retention and Weak urinary stream. Musculoskeletal Present- Back Pain, Joint Pain, Joint Swelling, Muscle Pain and Spasms. Not Present- Morning Stiffness and Muscle Weakness. Neurological Not Present- Blackout spells, Difficulty with balance, Dizziness, Paralysis, Tremor and Weakness. Psychiatric Not Present- Insomnia.  Vitals Weight: 200 lb Height: 61in Body Surface Area: 1.89 m Body Mass Index: 37.79 kg/m  Pulse: 72 (Regular)  BP: 142/82 (Sitting, Right Arm, Standard)  Physical Exam General Mental Status -Alert, cooperative and good historian. General Appearance-pleasant, Not in acute distress. Orientation-Oriented X3. Build & Nutrition-Well nourished and Well developed. Posture-Kyphoscoliotic.  Head and Neck Head-normocephalic, atraumatic . Neck Global Assessment - supple, no bruit auscultated on the right, no bruit auscultated on the left.  Eye Pupil - Bilateral-Regular and Round. Motion - Bilateral-EOMI.  Chest and Lung Exam Auscultation Breath sounds - clear at anterior chest wall and clear at posterior chest wall. Adventitious sounds - No Adventitious sounds.  Cardiovascular Auscultation Rhythm - Regular rate and rhythm. Heart Sounds - S1 WNL and S2 WNL. Murmurs & Other Heart Sounds - Auscultation of the heart reveals - No Murmurs.  Abdomen Inspection Contour - Generalized moderate distention. Palpation/Percussion Tenderness - Abdomen is  non-tender to palpation. Rigidity (guarding) - Abdomen is soft. Auscultation Auscultation of the abdomen reveals - Bowel sounds normal.  Female Genitourinary Note: Not done, not pertinent to present illness   Musculoskeletal Note: She is alert and oriented, in no apparent distress. Her right looks excellent. She now has full extension and can flex to about 118 degrees. There is no instability noted. This is a significant improvement from last visit. Left knee, no effusion. Range 10 to 120. Marked crepitus on range of motion. Tenderness on medial greater than lateral. No instability noted.  Assessment & Plan  Status post total right knee replacement [id number]) Primary osteoarthritis of left knee (M17.12)  Note:Surgical Plans: Left Total Knee Replacement  Disposition: Home  PCP: [facility]  IV TXA  Anesthesia Issues: None  Signed electronically by Ok Edwards, III PA-C

## 2016-06-06 ENCOUNTER — Inpatient Hospital Stay (HOSPITAL_COMMUNITY): Payer: BLUE CROSS/BLUE SHIELD | Admitting: Anesthesiology

## 2016-06-06 ENCOUNTER — Encounter (HOSPITAL_COMMUNITY)
Admission: RE | Disposition: A | Discharge status: Home Health | Payer: Self-pay | Source: Ambulatory Visit | Attending: Orthopedic Surgery

## 2016-06-06 ENCOUNTER — Inpatient Hospital Stay (HOSPITAL_COMMUNITY)
Admission: RE | Admit: 2016-06-06 | Discharge: 2016-06-08 | DRG: 470 | Disposition: A | Discharge status: Home Health | Payer: BLUE CROSS/BLUE SHIELD | Source: Ambulatory Visit | Attending: Orthopedic Surgery | Admitting: Orthopedic Surgery

## 2016-06-06 ENCOUNTER — Encounter (HOSPITAL_COMMUNITY): Payer: Self-pay | Admitting: *Deleted

## 2016-06-06 DIAGNOSIS — M1710 Unilateral primary osteoarthritis, unspecified knee: Secondary | ICD-10-CM | POA: Diagnosis present

## 2016-06-06 DIAGNOSIS — R7303 Prediabetes: Secondary | ICD-10-CM | POA: Diagnosis present

## 2016-06-06 DIAGNOSIS — K449 Diaphragmatic hernia without obstruction or gangrene: Secondary | ICD-10-CM | POA: Diagnosis present

## 2016-06-06 DIAGNOSIS — M1712 Unilateral primary osteoarthritis, left knee: Secondary | ICD-10-CM | POA: Diagnosis present

## 2016-06-06 DIAGNOSIS — K219 Gastro-esophageal reflux disease without esophagitis: Secondary | ICD-10-CM | POA: Diagnosis present

## 2016-06-06 DIAGNOSIS — Z85819 Personal history of malignant neoplasm of unspecified site of lip, oral cavity, and pharynx: Secondary | ICD-10-CM

## 2016-06-06 DIAGNOSIS — Z87891 Personal history of nicotine dependence: Secondary | ICD-10-CM | POA: Diagnosis not present

## 2016-06-06 DIAGNOSIS — E7800 Pure hypercholesterolemia, unspecified: Secondary | ICD-10-CM | POA: Diagnosis present

## 2016-06-06 DIAGNOSIS — M1732 Unilateral post-traumatic osteoarthritis, left knee: Secondary | ICD-10-CM

## 2016-06-06 DIAGNOSIS — Z8249 Family history of ischemic heart disease and other diseases of the circulatory system: Secondary | ICD-10-CM

## 2016-06-06 DIAGNOSIS — Z96651 Presence of right artificial knee joint: Secondary | ICD-10-CM | POA: Diagnosis present

## 2016-06-06 DIAGNOSIS — M179 Osteoarthritis of knee, unspecified: Secondary | ICD-10-CM | POA: Diagnosis present

## 2016-06-06 HISTORY — PX: TOTAL KNEE ARTHROPLASTY: SHX125

## 2016-06-06 LAB — TYPE AND SCREEN
ABO/RH(D): B NEG
Antibody Screen: NEGATIVE

## 2016-06-06 SURGERY — ARTHROPLASTY, KNEE, TOTAL
Anesthesia: Spinal | Site: Knee | Laterality: Left

## 2016-06-06 MED ORDER — POLYETHYLENE GLYCOL 3350 17 G PO PACK: 17.0000 g | PACK | Freq: Every day | ORAL | Status: DC | PRN

## 2016-06-06 MED ORDER — PROPOFOL 500 MG/50ML IV EMUL
INTRAVENOUS | Status: DC | PRN
  Administered 2016-06-06: 75 ug/kg/min via INTRAVENOUS

## 2016-06-06 MED ORDER — OXYCODONE HCL 5 MG PO TABS
5.0000 mg | ORAL_TABLET | ORAL | Status: DC | PRN
  Administered 2016-06-06 (×2): 10 mg via ORAL
  Administered 2016-06-06 (×2): 5 mg via ORAL
  Administered 2016-06-06 – 2016-06-08 (×11): 10 mg via ORAL
  Filled 2016-06-06 (×5): qty 2
  Filled 2016-06-06: qty 1
  Filled 2016-06-06 (×3): qty 2
  Filled 2016-06-06: qty 1
  Filled 2016-06-06 (×5): qty 2

## 2016-06-06 MED ORDER — MORPHINE SULFATE (PF) 2 MG/ML IV SOLN: 1.0000 mg | INTRAVENOUS | Status: DC | PRN

## 2016-06-06 MED ORDER — PHENYLEPHRINE HCL 10 MG/ML IJ SOLN
INTRAVENOUS | Status: DC | PRN
  Administered 2016-06-06: 50 ug/min via INTRAVENOUS

## 2016-06-06 MED ORDER — SODIUM CHLORIDE 0.9 % IR SOLN
Status: DC | PRN
  Administered 2016-06-06: 1000 mL

## 2016-06-06 MED ORDER — BUPIVACAINE HCL 0.25 % IJ SOLN
INTRAMUSCULAR | Status: DC | PRN
  Administered 2016-06-06: 20 mL

## 2016-06-06 MED ORDER — METHOCARBAMOL 1000 MG/10ML IJ SOLN
500.0000 mg | Freq: Four times a day (QID) | INTRAMUSCULAR | Status: DC | PRN
  Administered 2016-06-06: 500 mg via INTRAVENOUS
  Filled 2016-06-06: qty 550
  Filled 2016-06-06: qty 5

## 2016-06-06 MED ORDER — CHLORHEXIDINE GLUCONATE 4 % EX LIQD: 60.0000 mL | Freq: Once | CUTANEOUS | Status: DC

## 2016-06-06 MED ORDER — PROPOFOL 10 MG/ML IV BOLUS
INTRAVENOUS | Status: AC
  Filled 2016-06-06: qty 40

## 2016-06-06 MED ORDER — TRANEXAMIC ACID 1000 MG/10ML IV SOLN
1000.0000 mg | Freq: Once | INTRAVENOUS | Status: AC
  Administered 2016-06-06: 1000 mg via INTRAVENOUS
  Filled 2016-06-06: qty 10

## 2016-06-06 MED ORDER — RIVAROXABAN 10 MG PO TABS
10.0000 mg | ORAL_TABLET | Freq: Every day | ORAL | Status: DC
  Administered 2016-06-07 – 2016-06-08 (×2): 10 mg via ORAL
  Filled 2016-06-06 (×2): qty 1

## 2016-06-06 MED ORDER — MIDAZOLAM HCL 2 MG/2ML IJ SOLN
INTRAMUSCULAR | Status: AC
  Filled 2016-06-06: qty 2

## 2016-06-06 MED ORDER — ACETAMINOPHEN 10 MG/ML IV SOLN
1000.0000 mg | Freq: Once | INTRAVENOUS | Status: AC
  Administered 2016-06-06: 1000 mg via INTRAVENOUS
  Filled 2016-06-06: qty 100

## 2016-06-06 MED ORDER — METOCLOPRAMIDE HCL 5 MG/ML IJ SOLN: 5.0000 mg | Freq: Three times a day (TID) | INTRAMUSCULAR | Status: DC | PRN

## 2016-06-06 MED ORDER — SODIUM CHLORIDE 0.9 % IJ SOLN
INTRAMUSCULAR | Status: AC
  Filled 2016-06-06: qty 50

## 2016-06-06 MED ORDER — METHOCARBAMOL 500 MG PO TABS
500.0000 mg | ORAL_TABLET | Freq: Four times a day (QID) | ORAL | Status: DC | PRN
  Administered 2016-06-06 – 2016-06-07 (×3): 500 mg via ORAL
  Filled 2016-06-06 (×3): qty 1

## 2016-06-06 MED ORDER — DEXAMETHASONE SODIUM PHOSPHATE 10 MG/ML IJ SOLN
10.0000 mg | Freq: Once | INTRAMUSCULAR | Status: AC
  Administered 2016-06-06: 10 mg via INTRAVENOUS

## 2016-06-06 MED ORDER — CEFAZOLIN SODIUM-DEXTROSE 2-4 GM/100ML-% IV SOLN
INTRAVENOUS | Status: AC
  Filled 2016-06-06: qty 100

## 2016-06-06 MED ORDER — BUPIVACAINE LIPOSOME 1.3 % IJ SUSP
20.0000 mL | Freq: Once | INTRAMUSCULAR | Status: DC
  Filled 2016-06-06: qty 20

## 2016-06-06 MED ORDER — DEXAMETHASONE SODIUM PHOSPHATE 10 MG/ML IJ SOLN
INTRAMUSCULAR | Status: AC
  Filled 2016-06-06: qty 1

## 2016-06-06 MED ORDER — ACETAMINOPHEN 10 MG/ML IV SOLN
INTRAVENOUS | Status: AC
  Filled 2016-06-06: qty 100

## 2016-06-06 MED ORDER — MENTHOL 3 MG MT LOZG: 1.0000 | LOZENGE | OROMUCOSAL | Status: DC | PRN

## 2016-06-06 MED ORDER — PANTOPRAZOLE SODIUM 40 MG PO TBEC: 40.0000 mg | DELAYED_RELEASE_TABLET | Freq: Every day | ORAL | Status: DC

## 2016-06-06 MED ORDER — BUPIVACAINE HCL (PF) 0.25 % IJ SOLN
INTRAMUSCULAR | Status: AC
  Filled 2016-06-06: qty 30

## 2016-06-06 MED ORDER — SODIUM CHLORIDE 0.9 % IV SOLN
INTRAVENOUS | Status: DC
  Administered 2016-06-06 – 2016-06-07 (×2): via INTRAVENOUS

## 2016-06-06 MED ORDER — BUPIVACAINE IN DEXTROSE 0.75-8.25 % IT SOLN
INTRATHECAL | Status: DC | PRN
  Administered 2016-06-06: 1.8 mL via INTRATHECAL

## 2016-06-06 MED ORDER — FENTANYL CITRATE (PF) 100 MCG/2ML IJ SOLN
INTRAMUSCULAR | Status: DC | PRN
  Administered 2016-06-06: 50 ug via INTRAVENOUS

## 2016-06-06 MED ORDER — ONDANSETRON HCL 4 MG/2ML IJ SOLN
INTRAMUSCULAR | Status: AC
  Filled 2016-06-06: qty 2

## 2016-06-06 MED ORDER — SODIUM CHLORIDE 0.9 % IJ SOLN
INTRAMUSCULAR | Status: DC | PRN
  Administered 2016-06-06: 30 mL

## 2016-06-06 MED ORDER — DOCUSATE SODIUM 100 MG PO CAPS
100.0000 mg | ORAL_CAPSULE | Freq: Two times a day (BID) | ORAL | Status: DC
  Administered 2016-06-06 – 2016-06-08 (×4): 100 mg via ORAL
  Filled 2016-06-06 (×4): qty 1

## 2016-06-06 MED ORDER — PHENOL 1.4 % MT LIQD: 1.0000 | OROMUCOSAL | Status: DC | PRN

## 2016-06-06 MED ORDER — ACETAMINOPHEN 650 MG RE SUPP: 650.0000 mg | Freq: Four times a day (QID) | RECTAL | Status: DC | PRN

## 2016-06-06 MED ORDER — CEFAZOLIN SODIUM-DEXTROSE 2-4 GM/100ML-% IV SOLN
2.0000 g | Freq: Four times a day (QID) | INTRAVENOUS | Status: AC
  Administered 2016-06-06 (×2): 2 g via INTRAVENOUS
  Filled 2016-06-06 (×2): qty 100

## 2016-06-06 MED ORDER — DEXAMETHASONE SODIUM PHOSPHATE 10 MG/ML IJ SOLN
10.0000 mg | Freq: Once | INTRAMUSCULAR | Status: AC
  Administered 2016-06-07: 10 mg via INTRAVENOUS
  Filled 2016-06-06: qty 1

## 2016-06-06 MED ORDER — ONDANSETRON HCL 4 MG PO TABS: 4.0000 mg | ORAL_TABLET | Freq: Four times a day (QID) | ORAL | Status: DC | PRN

## 2016-06-06 MED ORDER — TERBINAFINE HCL 250 MG PO TABS: 250.0000 mg | ORAL_TABLET | Freq: Every day | ORAL | Status: DC

## 2016-06-06 MED ORDER — DIPHENHYDRAMINE HCL 12.5 MG/5ML PO ELIX: 12.5000 mg | ORAL_SOLUTION | ORAL | Status: DC | PRN

## 2016-06-06 MED ORDER — CEFAZOLIN SODIUM-DEXTROSE 2-4 GM/100ML-% IV SOLN
2.0000 g | INTRAVENOUS | Status: AC
  Administered 2016-06-06: 2 g via INTRAVENOUS
  Filled 2016-06-06: qty 100

## 2016-06-06 MED ORDER — LACTATED RINGERS IV SOLN
INTRAVENOUS | Status: DC
  Administered 2016-06-06: 11:00:00 via INTRAVENOUS
  Administered 2016-06-06: 1000 mL via INTRAVENOUS

## 2016-06-06 MED ORDER — BISACODYL 10 MG RE SUPP: 10.0000 mg | Freq: Every day | RECTAL | Status: DC | PRN

## 2016-06-06 MED ORDER — ACETAMINOPHEN 325 MG PO TABS: 650.0000 mg | ORAL_TABLET | Freq: Four times a day (QID) | ORAL | Status: DC | PRN

## 2016-06-06 MED ORDER — PROMETHAZINE HCL 25 MG/ML IJ SOLN: 6.2500 mg | INTRAMUSCULAR | Status: DC | PRN

## 2016-06-06 MED ORDER — FLEET ENEMA 7-19 GM/118ML RE ENEM: 1.0000 | ENEMA | Freq: Once | RECTAL | Status: DC | PRN

## 2016-06-06 MED ORDER — HYDROMORPHONE HCL 1 MG/ML IJ SOLN: 0.2500 mg | INTRAMUSCULAR | Status: DC | PRN

## 2016-06-06 MED ORDER — PHENYLEPHRINE HCL 10 MG/ML IJ SOLN
INTRAMUSCULAR | Status: AC
  Filled 2016-06-06: qty 1

## 2016-06-06 MED ORDER — FENTANYL CITRATE (PF) 100 MCG/2ML IJ SOLN
INTRAMUSCULAR | Status: AC
  Filled 2016-06-06: qty 2

## 2016-06-06 MED ORDER — TRAMADOL HCL 50 MG PO TABS: 50.0000 mg | ORAL_TABLET | Freq: Four times a day (QID) | ORAL | Status: DC | PRN

## 2016-06-06 MED ORDER — MIDAZOLAM HCL 5 MG/5ML IJ SOLN
INTRAMUSCULAR | Status: DC | PRN
  Administered 2016-06-06: 2 mg via INTRAVENOUS

## 2016-06-06 MED ORDER — ONDANSETRON HCL 4 MG/2ML IJ SOLN
4.0000 mg | Freq: Four times a day (QID) | INTRAMUSCULAR | Status: DC | PRN
Start: 2016-06-06 — End: 2016-06-08

## 2016-06-06 MED ORDER — BUPIVACAINE LIPOSOME 1.3 % IJ SUSP
INTRAMUSCULAR | Status: DC | PRN
  Administered 2016-06-06: 20 mL

## 2016-06-06 MED ORDER — METOCLOPRAMIDE HCL 5 MG PO TABS: 5.0000 mg | ORAL_TABLET | Freq: Three times a day (TID) | ORAL | Status: DC | PRN

## 2016-06-06 MED ORDER — TRANEXAMIC ACID 1000 MG/10ML IV SOLN
1000.0000 mg | INTRAVENOUS | Status: AC
  Administered 2016-06-06: 1000 mg via INTRAVENOUS
  Filled 2016-06-06: qty 1100

## 2016-06-06 MED ORDER — PROPOFOL 10 MG/ML IV BOLUS
INTRAVENOUS | Status: DC | PRN
  Administered 2016-06-06: 30 mg via INTRAVENOUS

## 2016-06-06 MED ORDER — ONDANSETRON HCL 4 MG/2ML IJ SOLN
INTRAMUSCULAR | Status: DC | PRN
  Administered 2016-06-06: 4 mg via INTRAVENOUS

## 2016-06-06 MED ORDER — ACETAMINOPHEN 500 MG PO TABS
1000.0000 mg | ORAL_TABLET | Freq: Four times a day (QID) | ORAL | Status: AC
  Administered 2016-06-06 – 2016-06-07 (×4): 1000 mg via ORAL
  Filled 2016-06-06 (×4): qty 2

## 2016-06-06 SURGICAL SUPPLY — 50 items
BAG DECANTER FOR FLEXI CONT (MISCELLANEOUS) ×2 IMPLANT
BAG SPEC THK2 15X12 ZIP CLS (MISCELLANEOUS) ×1
BAG ZIPLOCK 12X15 (MISCELLANEOUS) ×2 IMPLANT
BANDAGE ACE 6X5 VEL STRL LF (GAUZE/BANDAGES/DRESSINGS) ×2 IMPLANT
BLADE SAG 18X100X1.27 (BLADE) ×2 IMPLANT
BLADE SAW SGTL 11.0X1.19X90.0M (BLADE) ×2 IMPLANT
BOWL SMART MIX CTS (DISPOSABLE) ×2 IMPLANT
CAPT KNEE TOTAL 3 ATTUNE ×1 IMPLANT
CEMENT HV SMART SET (Cement) ×4 IMPLANT
CLOTH BEACON ORANGE TIMEOUT ST (SAFETY) ×2 IMPLANT
CUFF TOURN SGL QUICK 34 (TOURNIQUET CUFF) ×2
CUFF TRNQT CYL 34X4X40X1 (TOURNIQUET CUFF) ×1 IMPLANT
DECANTER SPIKE VIAL GLASS SM (MISCELLANEOUS) ×2 IMPLANT
DRAPE U-SHAPE 47X51 STRL (DRAPES) ×2 IMPLANT
DRSG ADAPTIC 3X8 NADH LF (GAUZE/BANDAGES/DRESSINGS) ×2 IMPLANT
DRSG PAD ABDOMINAL 8X10 ST (GAUZE/BANDAGES/DRESSINGS) ×2 IMPLANT
DURAPREP 26ML APPLICATOR (WOUND CARE) ×2 IMPLANT
ELECT REM PT RETURN 9FT ADLT (ELECTROSURGICAL) ×2
ELECTRODE REM PT RTRN 9FT ADLT (ELECTROSURGICAL) ×1 IMPLANT
EVACUATOR 1/8 PVC DRAIN (DRAIN) ×2 IMPLANT
GAUZE SPONGE 4X4 12PLY STRL (GAUZE/BANDAGES/DRESSINGS) ×2 IMPLANT
GLOVE BIO SURGEON STRL SZ7.5 (GLOVE) IMPLANT
GLOVE BIO SURGEON STRL SZ8 (GLOVE) ×2 IMPLANT
GLOVE BIOGEL PI IND STRL 6.5 (GLOVE) IMPLANT
GLOVE BIOGEL PI IND STRL 8 (GLOVE) ×1 IMPLANT
GLOVE BIOGEL PI INDICATOR 6.5 (GLOVE)
GLOVE BIOGEL PI INDICATOR 8 (GLOVE) ×1
GLOVE SURG SS PI 6.5 STRL IVOR (GLOVE) IMPLANT
GOWN STRL REUS W/TWL LRG LVL3 (GOWN DISPOSABLE) ×2 IMPLANT
GOWN STRL REUS W/TWL XL LVL3 (GOWN DISPOSABLE) IMPLANT
HANDPIECE INTERPULSE COAX TIP (DISPOSABLE) ×2
IMMOBILIZER KNEE 20 (SOFTGOODS) ×2
IMMOBILIZER KNEE 20 THIGH 36 (SOFTGOODS) ×1 IMPLANT
MANIFOLD NEPTUNE II (INSTRUMENTS) ×2 IMPLANT
NS IRRIG 1000ML POUR BTL (IV SOLUTION) ×2 IMPLANT
PACK TOTAL KNEE CUSTOM (KITS) ×2 IMPLANT
PADDING CAST COTTON 6X4 STRL (CAST SUPPLIES) ×4 IMPLANT
POSITIONER SURGICAL ARM (MISCELLANEOUS) ×2 IMPLANT
SET HNDPC FAN SPRY TIP SCT (DISPOSABLE) ×1 IMPLANT
STRIP CLOSURE SKIN 1/2X4 (GAUZE/BANDAGES/DRESSINGS) ×3 IMPLANT
SUT MNCRL AB 4-0 PS2 18 (SUTURE) ×2 IMPLANT
SUT VIC AB 2-0 CT1 27 (SUTURE) ×6
SUT VIC AB 2-0 CT1 TAPERPNT 27 (SUTURE) ×3 IMPLANT
SUT VLOC 180 0 24IN GS25 (SUTURE) ×2 IMPLANT
SYR 50ML LL SCALE MARK (SYRINGE) ×1 IMPLANT
TRAY FOLEY CATH 14FRSI W/METER (CATHETERS) ×1 IMPLANT
TRAY FOLEY W/METER SILVER 16FR (SET/KITS/TRAYS/PACK) ×1 IMPLANT
WATER STERILE IRR 1500ML POUR (IV SOLUTION) ×2 IMPLANT
WRAP KNEE MAXI GEL POST OP (GAUZE/BANDAGES/DRESSINGS) ×2 IMPLANT
YANKAUER SUCT BULB TIP 10FT TU (MISCELLANEOUS) ×2 IMPLANT

## 2016-06-06 NOTE — Transfer of Care (Signed)
Immediate Anesthesia Transfer of Care Note  Patient: Tonya Graves  Procedure(s) Performed: Procedure(s): LEFT TOTAL KNEE ARTHROPLASTY (Left)  Patient Location: PACU  Anesthesia Type:Spinal  Level of Consciousness:  sedated, patient cooperative and responds to stimulation  Airway & Oxygen Therapy:Patient Spontanous Breathing and Patient connected to face mask oxgen  Post-op Assessment:  Report given to PACU RN and Post -op Vital signs reviewed and stable  Post vital signs:  Reviewed and stable  Last Vitals:  Vitals:   06/06/16 0626  BP: (!) 162/83  Pulse: 97  Resp: 18  Temp: [id number] C    Complications: No apparent anesthesia complications

## 2016-06-06 NOTE — H&P (View-Only) (Signed)
Tonya Graves DOB: [birth year] Married / Language: English / Race: White Female Date of Admission:  06/06/2016 CC:  Left Knee Pain History of Present Illness The patient is a 65 year old female who comes in for a preoperative History and Physical. The patient is scheduled for a left total knee arthroplasty to be performed by Dr. Dione Plover. Aluisio, MD at [facility] on 06/06/2016. The patient is a 65 year old female who is months out from right total knee arthroplasty. The patient states that she is doing well at this time. control at this time and describe their pain as mild. They are currently on no medication for their pain. The patient is currently doing home exercise program. The patient feels that they are progressing well at this time. Note for "Post TKA": Patient states that she has been doing her home exercises, but does not feel like she has improved much since her last visit. Her right knee is doing very well at this time. It is the left knee which giving her most trouble. She has had injections in the left knee. She has documented advanced arthritis in that knee. She feels as though the knee is really limiting what she can and cannot do. She is ready to go ahead and discuss getting that knee fixed. They have been treated conservatively in the past for the above stated problem and despite conservative measures, they continue to have progressive pain and severe functional limitations and dysfunction. They have failed non-operative management including home exercise, medications. It is felt that they would benefit from undergoing total joint replacement. Risks and benefits of the procedure have been discussed with the patient and they elect to proceed with surgery. There are no active contraindications to surgery such as ongoing infection or rapidly progressive neurological disease.  Problem List/Past Medical  Primary osteoarthritis of right knee (M17.11)  Status post total right knee  replacement [id number])  Osteoarthritis  Hypercholesterolemia  Hiatal Hernia  Gastroesophageal Reflux Disease  Cataract  Bronchitis  Past History Varicose veins  Anemia  Past History Menopause  Measles  Mumps  Rubella  Eczema  Allergies No Known Drug Allergies  Family History Cerebrovascular Accident  Father, Paternal Grandmother. Heart Disease  Paternal Grandfather. Severe allergy  Sister. Cancer  Father, Maternal Grandmother, Sister. Hypertension  Mother. Osteoarthritis  Mother, Sister. Osteoporosis  Paternal Grandmother.  Social History Tobacco use  Former smoker. 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 Red Yeast Rice (Oral) Specific strength unknown - Active. Biotin (Oral daily) Specific strength unknown - Active. Multivitamin (Oral) Active. Calcium-Vitamin D (600-200MG -UNIT Tablet, 2 Oral daily) Active. Aleve (220MG  Tablet, Oral as needed) Active. Terbinafine HCl (250MG  Tablet, Oral daily) Active.   Past Surgical History  Cesarean Delivery  2 times Total Knee Replacement - Right [07/13/2015]: Colon Polyp Removal - Colonoscopy     Review of Systems General Not Present- Chills, Fatigue, Fever, Memory Loss, Night Sweats, Weight Gain and Weight Loss. Skin Not Present- Eczema, Hives, Itching, Lesions and Rash. HEENT Not Present- Dentures, Double Vision, Headache, Hearing Loss, Tinnitus and Visual Loss. Respiratory Present- Shortness of breath with  exertion. Not Present- Allergies, Chronic Cough, Coughing up blood and Shortness of breath  at rest. Cardiovascular Present- Palpitations. Not Present- Chest Pain, Difficulty Breathing Lying Down, Murmur, Racing/skipping heartbeats and Swelling. Gastrointestinal Present- Constipation and Heartburn. Not Present- Abdominal Pain, Bloody Stool, Diarrhea, Difficulty Swallowing, Jaundice, Loss of appetitie, Nausea and Vomiting. Female Genitourinary Present- Urinary frequency and Urinating at Night. Not Present- Blood in Urine, Discharge, Flank Pain, Incontinence, Painful Urination, Urgency, Urinary Retention and Weak urinary stream. Musculoskeletal Present- Back Pain, Joint Pain, Joint Swelling, Muscle Pain and Spasms. Not Present- Morning Stiffness and Muscle Weakness. Neurological Not Present- Blackout spells, Difficulty with balance, Dizziness, Paralysis, Tremor and Weakness. Psychiatric Not Present- Insomnia.  Vitals Weight: 200 lb Height: 61in Body Surface Area: 1.89 m Body Mass Index: 37.79 kg/m  Pulse: 72 (Regular)  BP: 142/82 (Sitting, Right Arm, Standard)  Physical Exam General Mental Status -Alert, cooperative and good historian. General Appearance-pleasant, Not in acute distress. Orientation-Oriented X3. Build & Nutrition-Well nourished and Well developed. Posture-Kyphoscoliotic.  Head and Neck Head-normocephalic, atraumatic . Neck Global Assessment - supple, no bruit auscultated on the right, no bruit auscultated on the left.  Eye Pupil - Bilateral-Regular and Round. Motion - Bilateral-EOMI.  Chest and Lung Exam Auscultation Breath sounds - clear at anterior chest wall and clear at posterior chest wall. Adventitious sounds - No Adventitious sounds.  Cardiovascular Auscultation Rhythm - Regular rate and rhythm. Heart Sounds - S1 WNL and S2 WNL. Murmurs & Other Heart Sounds - Auscultation of the heart reveals - No Murmurs.  Abdomen Inspection Contour - Generalized moderate distention. Palpation/Percussion Tenderness - Abdomen is  non-tender to palpation. Rigidity (guarding) - Abdomen is soft. Auscultation Auscultation of the abdomen reveals - Bowel sounds normal.  Female Genitourinary Note: Not done, not pertinent to present illness   Musculoskeletal Note: She is alert and oriented, in no apparent distress. Her right looks excellent. She now has full extension and can flex to about 118 degrees. There is no instability noted. This is a significant improvement from last visit. Left knee, no effusion. Range 10 to 120. Marked crepitus on range of motion. Tenderness on medial greater than lateral. No instability noted.  Assessment & Plan  Status post total right knee replacement [id number]) Primary osteoarthritis of left knee (M17.12)  Note:Surgical Plans: Left Total Knee Replacement  Disposition: Home  PCP: [facility]  IV TXA  Anesthesia Issues: None  Signed electronically by Ok Edwards, III PA-C

## 2016-06-06 NOTE — Anesthesia Postprocedure Evaluation (Signed)
Anesthesia Post Note  Patient: Tonya Graves  Procedure(s) Performed: Procedure(s) (LRB): LEFT TOTAL KNEE ARTHROPLASTY (Left)  Patient location during evaluation: PACU Anesthesia Type: Spinal Level of consciousness: oriented and awake and alert Pain management: pain level controlled Vital Signs Assessment: post-procedure vital signs reviewed and stable Respiratory status: spontaneous breathing, respiratory function stable and patient connected to nasal cannula oxygen Cardiovascular status: blood pressure returned to baseline and stable Postop Assessment: no headache, no backache, spinal receding and patient able to bend at knees Anesthetic complications: no    Last Vitals:  Vitals:   06/06/16 1200 06/06/16 1300  BP: 125/62 137/71  Pulse: 66 78  Resp: 16 17  Temp: 36.5 C 36.7 C    Last Pain:  Vitals:   06/06/16 1300  TempSrc: Oral  PainSc: 3                  Dianne Bady J

## 2016-06-06 NOTE — Op Note (Signed)
OPERATIVE REPORT-TOTAL KNEE ARTHROPLASTY   Pre-operative diagnosis- Osteoarthritis  Left knee(s)  Post-operative diagnosis- Osteoarthritis Left knee(s)  Procedure-  Left  Total Knee Arthroplasty  Surgeon- Dione Plover. Nasif Bos, MD  Assistant- Ardeen Jourdain, PA-C   Anesthesia-  Spinal  EBL-* No blood loss amount entered *   Drains Hemovac  Tourniquet time-  Total Tourniquet Time Documented: Thigh (Left) - 38 minutes Total: Thigh (Left) - 38 minutes     Complications- None  Condition-PACU - hemodynamically stable.   Brief Clinical Note  Tonya Graves is a 65 y.o. year old female with end stage OA of her left knee with progressively worsening pain and dysfunction. She has constant pain, with activity and at rest and significant functional deficits with difficulties even with ADLs. She has had extensive non-op management including analgesics, injections of cortisone and viscosupplements, and home exercise program, but remains in significant pain with significant dysfunction. Radiographs show bone on bone arthritis medial and patellofemoral. She presents now for left Total Knee Arthroplasty.    Procedure in detail---   The patient is brought into the operating room and positioned supine on the operating table. After successful administration of  Spinal,   a tourniquet is placed high on the  Left thigh(s) and the lower extremity is prepped and draped in the usual sterile fashion. Time out is performed by the operating team and then the  Left lower extremity is wrapped in Esmarch, knee flexed and the tourniquet inflated to 300 mmHg.       A midline incision is made with a ten blade through the subcutaneous tissue to the level of the extensor mechanism. A fresh blade is used to make a medial parapatellar arthrotomy. Soft tissue over the proximal medial tibia is subperiosteally elevated to the joint line with a knife and into the semimembranosus bursa with a Cobb elevator. Soft tissue  over the proximal lateral tibia is elevated with attention being paid to avoiding the patellar tendon on the tibial tubercle. The patella is everted, knee flexed 90 degrees and the ACL and PCL are removed. Findings are bone on bone medial and patellofemoral with large global osteophytes        The drill is used to create a starting hole in the distal femur and the canal is thoroughly irrigated with sterile saline to remove the fatty contents. The 5 degree Left  valgus alignment guide is placed into the femoral canal and the distal femoral cutting block is pinned to remove 9 mm off the distal femur. Resection is made with an oscillating saw.      The tibia is subluxed forward and the menisci are removed. The extramedullary alignment guide is placed referencing proximally at the medial aspect of the tibial tubercle and distally along the second metatarsal axis and tibial crest. The block is pinned to remove 66mm off the more deficient medial  side. Resection is made with an oscillating saw. Size 3is the most appropriate size for the tibia and the proximal tibia is prepared with the modular drill and keel punch for that size.      The femoral sizing guide is placed and size 3 is most appropriate. Rotation is marked off the epicondylar axis and confirmed by creating a rectangular flexion gap at 90 degrees. The size 3 cutting block is pinned in this rotation and the anterior, posterior and chamfer cuts are made with the oscillating saw. The intercondylar block is then placed and that cut is made.  Trial size 3 tibial component, trial size 3 posterior stabilized femur and a 8  mm posterior stabilized rotating platform insert trial is placed. Full extension is achieved with excellent varus/valgus and anterior/posterior balance throughout full range of motion. The patella is everted and thickness measured to be 22  mm. Free hand resection is taken to 12 mm, a 35 template is placed, lug holes are drilled, trial  patella is placed, and it tracks normally. Osteophytes are removed off the posterior femur with the trial in place. All trials are removed and the cut bone surfaces prepared with pulsatile lavage. Cement is mixed and once ready for implantation, the size 3 tibial implant, size  3 posterior stabilized femoral component, and the size 35 patella are cemented in place and the patella is held with the clamp. The trial insert is placed and the knee held in full extension. The Exparel (20 ml mixed with 30 ml saline) and .25% Bupivicaine, are injected into the extensor mechanism, posterior capsule, medial and lateral gutters and subcutaneous tissues.  All extruded cement is removed and once the cement is hard the permanent 8 mm posterior stabilized rotating platform insert is placed into the tibial tray.      The wound is copiously irrigated with saline solution and the extensor mechanism closed over a hemovac drain with #1 V-loc suture. The tourniquet is released for a total tourniquet time of 38  minutes. Flexion against gravity is 135 degrees and the patella tracks normally. Subcutaneous tissue is closed with 2.0 vicryl and subcuticular with running 4.0 Monocryl. The incision is cleaned and dried and steri-strips and a bulky sterile dressing are applied. The limb is placed into a knee immobilizer and the patient is awakened and transported to recovery in stable condition.      Please note that a surgical assistant was a medical necessity for this procedure in order to perform it in a safe and expeditious manner. Surgical assistant was necessary to retract the ligaments and vital neurovascular structures to prevent injury to them and also necessary for proper positioning of the limb to allow for anatomic placement of the prosthesis.   Dione Plover Zyire Eidson, MD    06/06/2016, 10:15 AM

## 2016-06-06 NOTE — Anesthesia Procedure Notes (Addendum)
Spinal  Patient location during procedure: OR Start time: 06/06/2016 9:10 AM End time: 06/06/2016 9:15 AM Reason for block: at surgeon's request Staffing Resident/CRNA: Anne Fu Performed: resident/CRNA  Preanesthetic Checklist Completed: patient identified, site marked, surgical consent, pre-op evaluation, timeout performed, IV checked, risks and benefits discussed and monitors and equipment checked Spinal Block Patient position: sitting Prep: ChloraPrep Patient monitoring: heart rate, continuous pulse ox and blood pressure Approach: right paramedian Location: L2-3 Injection technique: single-shot Needle Needle type: Whitacre  Needle gauge: 25 G Needle length: 9 cm Assessment Sensory level: T6 Additional Notes Expiration date of kit checked and confirmed. Patient tolerated procedure well, without complications. X 1 attempt with noted clear CSF return. Loss of motor and sensory on exam post injection.

## 2016-06-06 NOTE — Interval H&P Note (Signed)
History and Physical Interval Note:  06/06/2016 6:48 AM  Tonya Graves  has presented today for surgery, with the diagnosis of LEFT KNEE OA   The various methods of treatment have been discussed with the patient and family. After consideration of risks, benefits and other options for treatment, the patient has consented to  Procedure(s): LEFT TOTAL KNEE ARTHROPLASTY (Left) as a surgical intervention .  The patient's history has been reviewed, patient examined, no change in status, stable for surgery.  I have reviewed the patient's chart and labs.  Questions were answered to the patient's satisfaction.     Gearlean Alf

## 2016-06-06 NOTE — Evaluation (Signed)
Physical Therapy Evaluation Patient Details Name: Tonya Graves MRN: [id number] DOB: 1951-08-11 Today's Date: 06/06/2016   History of Present Illness  s/p L TKA; PMHx:  R TKA  Clinical Impression  Pt is s/p TKA resulting in the deficits listed below (see PT Problem List).   Pt will benefit from skilled PT to increase their independence and safety with mobility to allow discharge to the venue listed below.      Follow Up Recommendations Home health PT;Outpatient PT    Equipment Recommendations  Rolling walker with 5" wheels    Recommendations for Other Services       Precautions / Restrictions Precautions Precautions: Fall;Knee Required Braces or Orthoses: Knee Immobilizer - Left Knee Immobilizer - Left: Discontinue once straight leg raise with < 10 degree lag      Mobility  Bed Mobility Overal bed mobility: Needs Assistance Bed Mobility: Supine to Sit     Supine to sit: Min assist;Mod assist     General bed mobility comments: incr time, assist with LLE and scooting  Transfers Overall transfer level: Needs assistance Equipment used: Rolling walker (2 wheeled) Transfers: Sit to/from Stand Sit to Stand: Min assist         General transfer comment: cues for hand placement and LLE position  Ambulation/Gait Ambulation/Gait assistance: Min assist Ambulation Distance (Feet): 14 Feet Assistive device: Rolling walker (2 wheeled) Gait Pattern/deviations: Step-to pattern;Trunk flexed;Antalgic Gait velocity: decr   General Gait Details: cues for sequence and RW position, posture  Stairs            Wheelchair Mobility    Modified Rankin (Stroke Patients Only)       Balance                                             Pertinent Vitals/Pain Pain Assessment: 0-10 Pain Score: 4  Pain Location: L knee Pain Descriptors / Indicators: Aching Pain Intervention(s): Limited activity within patient's tolerance;Monitored during  session;Premedicated before session    Home Living Family/patient expects to be discharged to:: Private residence Living Arrangements: Spouse/significant other Available Help at Discharge: Family;Available 24 hours/day (sister and husband) Type of Home: House Home Access: Level entry     Home Layout: One level Home Equipment: Tub bench;Toilet riser;Walker - 2 wheels;Cane - single point      Prior Function Level of Independence: Independent;Independent with assistive device(s)         Comments: amb with cane occasionally     Hand Dominance   Dominant Hand: Right    Extremity/Trunk Assessment   Upper Extremity Assessment: Defer to OT evaluation           Lower Extremity Assessment: LLE deficits/detail   LLE Deficits / Details: ankle WFL; knee extension with hip flexion 2+/5--anticipated post op weakness     Communication   Communication: No difficulties  Cognition Arousal/Alertness: Awake/alert Behavior During Therapy: WFL for tasks assessed/performed Overall Cognitive Status: Within Functional Limits for tasks assessed                      General Comments      Exercises Total Joint Exercises Ankle Circles/Pumps: AROM;Both;10 reps Quad Sets: AROM;Both;5 reps   Assessment/Plan    PT Assessment Patient needs continued PT services  PT Problem List Decreased strength;Decreased range of motion;Decreased knowledge of use of DME;Decreased mobility;Pain  PT Treatment Interventions DME instruction;Gait training;Functional mobility training;Therapeutic exercise;Therapeutic activities;Patient/family education    PT Goals (Current goals can be found in the Care Plan section)  Acute Rehab PT Goals Patient Stated Goal: get back to being IND PT Goal Formulation: With patient Time For Goal Achievement: 06/10/16 Potential to Achieve Goals: Good    Frequency 7X/week   Barriers to discharge        Co-evaluation               End of  Session Equipment Utilized During Treatment: Gait belt;Left knee immobilizer Activity Tolerance: Patient tolerated treatment well Patient left: in chair;with call bell/phone within reach;with chair alarm set;with family/visitor present           Time: [id number] PT Time Calculation (min) (ACUTE ONLY): 17 min   Charges:   PT Evaluation $PT Eval Low Complexity: 1 Procedure     PT G Codes:        Mustapha Colson Jun 14, 2016, 3:57 PM

## 2016-06-06 NOTE — Anesthesia Preprocedure Evaluation (Signed)
Anesthesia Evaluation  Patient identified by MRN, date of birth, ID band Patient awake    Reviewed: Allergy & Precautions, NPO status , Patient's Chart, lab work & pertinent test results  Airway Mallampati: II  TM Distance: >3 FB Neck ROM: Full    Dental no notable dental hx.    Pulmonary neg pulmonary ROS,    Pulmonary exam normal breath sounds clear to auscultation       Cardiovascular negative cardio ROS Normal cardiovascular exam Rhythm:Regular Rate:Normal     Neuro/Psych negative neurological ROS  negative psych ROS   GI/Hepatic Neg liver ROS, hiatal hernia, GERD  Medicated,  Endo/Other  negative endocrine ROS  Renal/GU negative Renal ROS  negative genitourinary   Musculoskeletal  (+) Arthritis ,   Abdominal (+) + obese,   Peds negative pediatric ROS (+)  Hematology  (+) anemia ,   Anesthesia Other Findings   Reproductive/Obstetrics negative OB ROS                             Anesthesia Physical Anesthesia Plan  ASA: II  Anesthesia Plan: Spinal   Post-op Pain Management:    Induction: Intravenous  Airway Management Planned: Natural Airway  Additional Equipment:   Intra-op Plan:   Post-operative Plan:   Informed Consent: I have reviewed the patients History and Physical, chart, labs and discussed the procedure including the risks, benefits and alternatives for the proposed anesthesia with the patient or authorized representative who has indicated his/her understanding and acceptance.   Dental advisory given  Plan Discussed with: CRNA  Anesthesia Plan Comments: (Discussed risks and benefits of and differences between spinal and general. Discussed risks of spinal including headache, backache, failure, bleeding and hematoma, infection, and nerve damage. Patient consents to spinal. Questions answered. Coagulation studies and platelet count acceptable.)         Anesthesia Quick Evaluation

## 2016-06-07 LAB — BASIC METABOLIC PANEL
Anion gap: 5 (ref 5–15)
BUN: 12 mg/dL (ref 6–20)
CO2: 25 mmol/L (ref 22–32)
Calcium: 8.1 mg/dL — ABNORMAL LOW (ref 8.9–10.3)
Chloride: 107 mmol/L (ref 101–111)
Creatinine, Ser: 0.64 mg/dL (ref 0.44–1.00)
GFR calc Af Amer: >60 mL/min (ref >60)
GFR calc non Af Amer: >60 mL/min (ref >60)
Glucose, Bld: 151 mg/dL — ABNORMAL HIGH (ref 65–99)
Potassium: 3.8 mmol/L (ref 3.5–5.1)
Sodium: 137 mmol/L (ref 135–145)

## 2016-06-07 LAB — CBC
HCT: 31.6 % — ABNORMAL LOW (ref 36.0–46.0)
Hemoglobin: 10.4 g/dL — ABNORMAL LOW (ref 12.0–15.0)
MCH: 29 pg (ref 26.0–34.0)
MCHC: 32.9 g/dL (ref 30.0–36.0)
MCV: 88 fL (ref 78.0–100.0)
Platelets: 205 10*3/uL (ref 150–400)
RBC: 3.59 MIL/uL — ABNORMAL LOW (ref 3.87–5.11)
RDW: 14.1 % (ref 11.5–15.5)
WBC: 7.3 10*3/uL (ref 4.0–10.5)

## 2016-06-07 MED ORDER — METHOCARBAMOL 500 MG PO TABS: 500.0000 mg | ORAL_TABLET | Freq: Four times a day (QID) | ORAL | 0 refills | Status: DC | PRN

## 2016-06-07 MED ORDER — NON FORMULARY: 20.0000 mg | Freq: Every day | Status: DC

## 2016-06-07 MED ORDER — OMEPRAZOLE 20 MG PO CPDR
20.0000 mg | DELAYED_RELEASE_CAPSULE | Freq: Every day | ORAL | Status: DC
  Administered 2016-06-07 – 2016-06-08 (×2): 20 mg via ORAL
  Filled 2016-06-07 (×2): qty 1

## 2016-06-07 MED ORDER — RIVAROXABAN 10 MG PO TABS: 10.0000 mg | ORAL_TABLET | Freq: Every day | ORAL | 0 refills | Status: DC

## 2016-06-07 MED ORDER — ALUM & MAG HYDROXIDE-SIMETH 200-200-20 MG/5ML PO SUSP: 30.0000 mL | ORAL | Status: DC | PRN

## 2016-06-07 MED ORDER — TRAMADOL HCL 50 MG PO TABS: 50.0000 mg | ORAL_TABLET | Freq: Four times a day (QID) | ORAL | 1 refills | Status: DC | PRN

## 2016-06-07 MED ORDER — ONDANSETRON HCL 4 MG PO TABS: 4.0000 mg | ORAL_TABLET | Freq: Four times a day (QID) | ORAL | 0 refills | Status: DC | PRN

## 2016-06-07 MED ORDER — OXYCODONE HCL 5 MG PO TABS: 5.0000 mg | ORAL_TABLET | ORAL | 0 refills | Status: DC | PRN

## 2016-06-07 NOTE — Progress Notes (Signed)
   Subjective: 1 Day Post-Op Procedure(s) (LRB): LEFT TOTAL KNEE ARTHROPLASTY (Left) Patient reports pain as mild.   Patient seen in rounds by Dr. Wynelle Link. Patient is well, but has had some minor complaints of pain in the knee, requiring pain medications We will start therapy today.  Plan is to go Home after hospital stay.  Objective: Vital signs in last 24 hours: Temp:  [97.9 F (36.6 C)-98.5 F (36.9 C)] 98.1 F (36.7 C) (10/03 1434) Pulse Rate:  [69-74] 74 (10/03 1434) Resp:  [18] 18 (10/03 1434) BP: (129-145)/(62-70) 145/70 (10/03 1434) SpO2:  [97 %-100 %] 97 % (10/03 1434)  Intake/Output from previous day:  Intake/Output Summary (Last 24 hours) at 06/07/16 2222 Last data filed at 06/07/16 1905  Gross per 24 hour  Intake          1761.25 ml  Output             1525 ml  Net           236.25 ml    Intake/Output this shift: Total I/O In: 240 [P.O.:240] Out: -   Labs:  Recent Labs  06/07/16 0426  HGB 10.4*    Recent Labs  06/07/16 0426  WBC 7.3  RBC 3.59*  HCT 31.6*  PLT 205    Recent Labs  06/07/16 0426  NA 137  K 3.8  CL 107  CO2 25  BUN 12  CREATININE 0.64  GLUCOSE 151*  CALCIUM 8.1*   No results for input(s): LABPT, INR in the last 72 hours.  EXAM General - Patient is Alert, Appropriate and Oriented Extremity - Neurovascular intact Sensation intact distally Dorsiflexion/Plantar flexion intact Dressing - dressing C/D/I Motor Function - intact, moving foot and toes well on exam.  Hemovac pulled without difficulty.  Past Medical History:  Diagnosis Date  . Anemia YEARS AGO  . Arthritis    OA  . Bronchitis   . 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 Cataracts, bilateral   . GERD (gastroesophageal reflux disease)   . Heart palpitations 2016 august  . High cholesterol   . History of hiatal hernia   . History of palpitations    NONE IN LAST FEW YEARS   . Osteopenia   . Prediabetes   . Urine frequency    . UTI (urinary tract infection)   . Varicose veins     Assessment/Plan: 1 Day Post-Op Procedure(s) (LRB): LEFT TOTAL KNEE ARTHROPLASTY (Left) Active Problems:   OA (osteoarthritis) of knee  Estimated body mass index is 37.6 kg/m as calculated from the following:   Height as of this encounter: 5\' 1"  (1.549 m).   Weight as of this encounter: 90.3 kg (199 lb). Advance diet Up with therapy Plan for discharge tomorrow Discharge home with home health  DVT Prophylaxis - Xarelto Weight-Bearing as tolerated to left leg D/C O2 and Pulse OX and try on Room Air  Arlee Muslim, PA-C Orthopaedic Surgery 06/07/2016, 10:22 PM\

## 2016-06-07 NOTE — Discharge Summary (Signed)
Physician Discharge Summary   Patient ID: Tonya Graves MRN: [id number] DOB/AGE: 06-Jan-1951 65 y.o.  Admit date: 06/06/2016 Discharge date: 06/08/2016  Primary Diagnosis:  Osteoarthritis  Left knee(s) Admission Diagnoses:  Past Medical History:  Diagnosis Date  . Anemia YEARS AGO  . Arthritis    OA  . Bronchitis   . 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 Cataracts, bilateral   . GERD (gastroesophageal reflux disease)   . Heart palpitations 2016 august  . High cholesterol   . History of hiatal hernia   . History of palpitations    NONE IN LAST FEW YEARS   . Osteopenia   . Prediabetes   . Urine frequency   . UTI (urinary tract infection)   . Varicose veins    Discharge Diagnoses:   Active Problems:   OA (osteoarthritis) of knee  Estimated body mass index is 37.6 kg/m as calculated from the following:   Height as of this encounter: '5\' 1"'  (1.549 m).   Weight as of this encounter: 90.3 kg (199 lb).  Procedure:  Procedure(s) (LRB): LEFT TOTAL KNEE ARTHROPLASTY (Left)   Consults: None  HPI: Tonya Graves is a 65 y.o. year old female with end stage OA of her left knee with progressively worsening pain and dysfunction. She has constant pain, with activity and at rest and significant functional deficits with difficulties even with ADLs. She has had extensive non-op management including analgesics, injections of cortisone and viscosupplements, and home exercise program, but remains in significant pain with significant dysfunction. Radiographs show bone on bone arthritis medial and patellofemoral. She presents now for left Total Knee Arthroplasty.   Laboratory Data: Admission on 06/06/2016  Component Date Value Ref Range Status  . WBC 06/07/2016 7.3  4.0 - 10.5 K/uL Final  . RBC 06/07/2016 3.59* 3.87 - 5.11 MIL/uL Final  . Hemoglobin 06/07/2016 10.4* 12.0 - 15.0 g/dL Final  . HCT 06/07/2016 31.6* 36.0 - 46.0 % Final  . MCV 06/07/2016 88.0   78.0 - 100.0 fL Final  . MCH 06/07/2016 29.0  26.0 - 34.0 pg Final  . MCHC 06/07/2016 32.9  30.0 - 36.0 g/dL Final  . RDW 06/07/2016 14.1  11.5 - 15.5 % Final  . Platelets 06/07/2016 205  150 - 400 K/uL Final  . Sodium 06/07/2016 137  135 - 145 mmol/L Final  . Potassium 06/07/2016 3.8  3.5 - 5.1 mmol/L Final  . Chloride 06/07/2016 107  101 - 111 mmol/L Final  . CO2 06/07/2016 25  22 - 32 mmol/L Final  . Glucose, Bld 06/07/2016 151* 65 - 99 mg/dL Final  . BUN 06/07/2016 12  6 - 20 mg/dL Final  . Creatinine, Ser 06/07/2016 0.64  0.44 - 1.00 mg/dL Final  . Calcium 06/07/2016 8.1* 8.9 - 10.3 mg/dL Final  . GFR calc non Af Amer 06/07/2016 >60  >60 mL/min Final  . GFR calc Af Amer 06/07/2016 >60  >60 mL/min Final   Comment: (NOTE) The eGFR has been calculated using the CKD EPI equation. This calculation has not been validated in all clinical situations. eGFR's persistently <60 mL/min signify possible Chronic Kidney Disease.   Georgiann Hahn gap 06/07/2016 5  5 - 15 Final  Hospital Outpatient Visit on 05/27/2016  Component Date Value Ref Range Status  . aPTT 05/27/2016 31  24 - 36 seconds Final  . WBC 05/27/2016 4.8  4.0 - 10.5 K/uL Final  . RBC 05/27/2016 4.22  3.87 - 5.11 MIL/uL Final  . Hemoglobin 05/27/2016 11.9* 12.0 - 15.0 g/dL Final  . HCT 05/27/2016 37.4  36.0 - 46.0 % Final  . MCV 05/27/2016 88.6  78.0 - 100.0 fL Final  . MCH 05/27/2016 28.2  26.0 - 34.0 pg Final  . MCHC 05/27/2016 31.8  30.0 - 36.0 g/dL Final  . RDW 05/27/2016 14.7  11.5 - 15.5 % Final  . Platelets 05/27/2016 254  150 - 400 K/uL Final  . Sodium 05/27/2016 140  135 - 145 mmol/L Final  . Potassium 05/27/2016 4.4  3.5 - 5.1 mmol/L Final  . Chloride 05/27/2016 108  101 - 111 mmol/L Final  . CO2 05/27/2016 27  22 - 32 mmol/L Final  . Glucose, Bld 05/27/2016 92  65 - 99 mg/dL Final  . BUN 05/27/2016 23* 6 - 20 mg/dL Final  . Creatinine, Ser 05/27/2016 0.72  0.44 - 1.00 mg/dL Final  . Calcium 05/27/2016 9.0  8.9 - 10.3  mg/dL Final  . Total Protein 05/27/2016 7.1  6.5 - 8.1 g/dL Final  . Albumin 05/27/2016 3.9  3.5 - 5.0 g/dL Final  . AST 05/27/2016 22  15 - 41 U/L Final  . ALT 05/27/2016 17  14 - 54 U/L Final  . Alkaline Phosphatase 05/27/2016 55  38 - 126 U/L Final  . Total Bilirubin 05/27/2016 0.4  0.3 - 1.2 mg/dL Final  . GFR calc non Af Amer 05/27/2016 >60  >60 mL/min Final  . GFR calc Af Amer 05/27/2016 >60  >60 mL/min Final   Comment: (NOTE) The eGFR has been calculated using the CKD EPI equation. This calculation has not been validated in all clinical situations. eGFR's persistently <60 mL/min signify possible Chronic Kidney Disease.   . Anion gap 05/27/2016 5  5 - 15 Final  . Prothrombin Time 05/27/2016 13.5  11.4 - 15.2 seconds Final  . INR 05/27/2016 1.03   Final  . ABO/RH(D) 06/06/2016 B NEG   Final  . Antibody Screen 06/06/2016 NEG   Final  . Sample Expiration 06/06/2016 06/09/2016   Final  . Extend sample reason 06/06/2016 NO TRANSFUSIONS OR PREGNANCY IN THE PAST 3 MONTHS   Final  . Color, Urine 05/27/2016 YELLOW  YELLOW Final  . APPearance 05/27/2016 CLEAR  CLEAR Final  . Specific Gravity, Urine 05/27/2016 1.022  1.005 - 1.030 Final  . pH 05/27/2016 6.0  5.0 - 8.0 Final  . Glucose, UA 05/27/2016 NEGATIVE  NEGATIVE mg/dL Final  . Hgb urine dipstick 05/27/2016 NEGATIVE  NEGATIVE Final  . Bilirubin Urine 05/27/2016 NEGATIVE  NEGATIVE Final  . Ketones, ur 05/27/2016 NEGATIVE  NEGATIVE mg/dL Final  . Protein, ur 05/27/2016 NEGATIVE  NEGATIVE mg/dL Final  . Nitrite 05/27/2016 NEGATIVE  NEGATIVE Final  . Leukocytes, UA 05/27/2016 NEGATIVE  NEGATIVE Final  . MRSA, PCR 05/27/2016 NEGATIVE  NEGATIVE Final  . Staphylococcus aureus 05/27/2016 NEGATIVE  NEGATIVE Final   Comment:        The Xpert SA Assay (FDA approved for NASAL specimens in patients over 24 years of age), is one component of a comprehensive surveillance program.  Test performance has been validated by [facility] for  patients greater than or equal to 76 year old. It is not intended to diagnose infection nor to guide or monitor treatment.      X-Rays:No results found.  EKG:No orders found for this or any previous visit.   Hospital Course: Tonya Graves is a 65 y.o. who was admitted to [facility]. They were brought to the operating room on 06/06/2016 and underwent Procedure(s): LEFT TOTAL KNEE ARTHROPLASTY.  Patient tolerated the procedure well and was later transferred to the recovery room and then to the orthopaedic floor for postoperative care.  They were given PO and IV analgesics for pain control following their surgery.  They were given 24 hours of postoperative antibiotics of  Anti-infectives    Start     Dose/Rate Route Frequency Ordered Stop   06/07/16 1000  terbinafine (LAMISIL) tablet 250 mg  Status:  Discontinued     250 mg Oral Daily 06/06/16 1159 06/06/16 1237   06/06/16 1500  ceFAZolin (ANCEF) IVPB 2g/100 mL premix     2 g 200 mL/hr over 30 Minutes Intravenous Every 6 hours 06/06/16 1159 06/06/16 2113   06/06/16 0634  ceFAZolin (ANCEF) IVPB 2g/100 mL premix     2 g 200 mL/hr over 30 Minutes Intravenous On call to O.R. 06/06/16 [id number] 06/06/16 0917     and started on DVT prophylaxis in the form of Xarelto.   PT and OT were ordered for total joint protocol.  Discharge planning consulted to help with postop disposition and equipment needs.  Patient had a decent night on the evening of surgery.  They started to get up OOB with therapy on day one. Hemovac drain was pulled without difficulty.  Continued to work with therapy into day two.  Dressing was changed on day two and the incision was healing well.  Patient was seen in rounds on day two and was ready to go home.  Discharge home with home health Diet - Cardiac diet Follow up - in 2 weeks Activity - WBAT Disposition - Home Condition Upon Discharge - Good D/C Meds - See DC Summary DVT Prophylaxis - Xarelto  Discharge  Instructions    Call MD / Call 911    Complete by:  As directed    If you experience chest pain or shortness of breath, CALL 911 and be transported to the hospital emergency room.  If you develope a fever above 101 F, pus (white drainage) or increased drainage or redness at the wound, or calf pain, call your surgeon's office.   Change dressing    Complete by:  As directed    Change dressing daily with sterile 4 x 4 inch gauze dressing and apply TED hose. Do not submerge the incision under water.   Constipation Prevention    Complete by:  As directed    Drink plenty of fluids.  Prune juice may be helpful.  You may use a stool softener, such as Colace (over the counter) 100 mg twice a day.  Use MiraLax (over the counter) for constipation as needed.   Diet - low sodium heart healthy    Complete by:  As directed    Discharge instructions    Complete by:  As directed    Pick up stool softner and laxative for home use following surgery while on pain medications. Do not submerge incision under water. Please use good hand washing techniques while changing dressing each day. May shower starting three days after surgery. Please use a clean towel to pat the incision dry following showers. Continue to use ice for pain and swelling after surgery. Do not use any lotions or creams on the incision until instructed by your surgeon.   Postoperative Constipation Protocol  Constipation - defined medically as fewer than three stools per week and severe constipation as less than one stool per week.  One of the most common issues patients have following surgery is constipation.  Even if you have a regular bowel pattern at home, your normal regimen is likely to be disrupted due to multiple reasons following surgery.  Combination of anesthesia, postoperative narcotics, change in appetite and fluid intake all can affect your bowels.  In order to avoid complications following surgery, here are some recommendations  in order to help you during your recovery period.  Colace (docusate) - Pick up an over-the-counter form of Colace or another stool softener and take twice a day as long as you are requiring postoperative pain medications.  Take with a full glass of water daily.  If you experience loose stools or diarrhea, hold the colace until you stool forms back up.  If your symptoms do not get better within 1 week or if they get worse, check with your doctor.  Dulcolax (bisacodyl) - Pick up over-the-counter and take as directed by the product packaging as needed to assist with the movement of your bowels.  Take with a full glass of water.  Use this product as needed if not relieved by Colace only.   MiraLax (polyethylene glycol) - Pick up over-the-counter to have on hand.  MiraLax is a solution that will increase the amount of water in your bowels to assist with bowel movements.  Take as directed and can mix with a glass of water, juice, soda, coffee, or tea.  Take if you go more than two days without a movement. Do not use MiraLax more than once per day. Call your doctor if you are still constipated or irregular after using this medication for 7 days in a row.  If you continue to have problems with postoperative constipation, please contact the office for further assistance and recommendations.  If you experience "the worst abdominal pain ever" or develop nausea or vomiting, please contact the office immediatly for further recommendations for treatment.   Take Xarelto for two and a half more weeks, then discontinue Xarelto. Once the patient has completed the blood thinner regimen, then take a Baby 81 mg Aspirin daily for three more weeks.   Do not put a pillow under the knee. Place it under the heel.    Complete by:  As directed    Do not sit on low chairs, stoools or toilet seats, as it may be difficult to get up from low surfaces    Complete by:  As directed    Driving restrictions    Complete by:  As  directed    No driving until released by the physician.   Increase activity slowly as tolerated    Complete by:  As directed    Lifting restrictions    Complete by:  As directed    No lifting until released by the physician.   Patient may shower    Complete by:  As directed    You may shower without a dressing once there is no drainage.  Do not wash over the wound.  If drainage remains, do not shower until drainage stops.   TED hose    Complete by:  As directed    Use stockings (TED hose) for 3 weeks on both leg(s).  You may remove them at night for sleeping.   Weight bearing as tolerated    Complete by:  As directed    Laterality:  left   Extremity:  Lower       Medication List    STOP taking these medications  calcium-vitamin D 500-200 MG-UNIT tablet   conjugated estrogens vaginal cream Commonly known as:  PREMARIN   multivitamin tablet   naproxen sodium 220 MG tablet Commonly known as:  ANAPROX   VITAMIN B-2 PO     TAKE these medications   ARTIFICIAL TEARS OP Place 1 drop into both eyes daily as needed. Dry eyes   methocarbamol 500 MG tablet Commonly known as:  ROBAXIN Take 1 tablet (500 mg total) by mouth every 6 (six) hours as needed for muscle spasms.   omeprazole 20 MG capsule Commonly known as:  PRILOSEC Take 20 mg by mouth daily.   ondansetron 4 MG tablet Commonly known as:  ZOFRAN Take 1 tablet (4 mg total) by mouth every 6 (six) hours as needed for nausea.   oxyCODONE 5 MG immediate release tablet Commonly known as:  Oxy IR/ROXICODONE Take 1-2 tablets (5-10 mg total) by mouth every 3 (three) hours as needed for moderate pain or severe pain.   rivaroxaban 10 MG Tabs tablet Commonly known as:  XARELTO Take 1 tablet (10 mg total) by mouth daily with breakfast. Take Xarelto for two and a half more weeks, then discontinue Xarelto. Once the patient has completed the blood thinner regimen, then take a Baby 81 mg Aspirin daily for three more  weeks. Start taking on:  06/08/2016   SYSTANE OP Place 1 drop into both eyes 2 (two) times daily.   traMADol 50 MG tablet Commonly known as:  ULTRAM Take 1-2 tablets (50-100 mg total) by mouth every 6 (six) hours as needed (mild pain).      Follow-up Information    Gearlean Alf, MD. Schedule an appointment as soon as possible for a visit on 06/21/2016.   Specialty:  Orthopedic Surgery Contact information: [address] [place] [address] [phone]           Signed: Arlee Muslim, PA-C Orthopaedic Surgery 06/07/2016, 10:28 PM

## 2016-06-07 NOTE — Progress Notes (Signed)
OT Cancellation Note  Patient Details Name: Tonya Graves MRN: [id number] DOB: 05/06/1951   Cancelled Treatment:    Reason Eval/Treat Not Completed: OT screened, no needs identified, will sign off  Esvin Hnat 06/07/2016, 10:43 AM  Lesle Chris, OTR/L [phone] 06/07/2016

## 2016-06-07 NOTE — Progress Notes (Signed)
Physical Therapy Treatment Patient Details Name: Tonya Graves MRN: [id number] DOB: 1950/10/19 Today's Date: 06/07/2016    History of Present Illness s/p L TKA; PMHx:  R TKA    PT Comments    POD # 1 Assisted with amb a second time in hallway, then to bathroom then back to bed for CPM.  Pt progressing well.   Follow Up Recommendations  Home health PT;Outpatient PT     Equipment Recommendations  Rolling walker with 5" wheels    Recommendations for Other Services       Precautions / Restrictions Precautions Precautions: Fall;Knee Required Braces or Orthoses: Knee Immobilizer - Left Knee Immobilizer - Left: Discontinue once straight leg raise with < 10 degree lag Restrictions Weight Bearing Restrictions: No    Mobility  Bed Mobility Overal bed mobility: Needs Assistance Bed Mobility: Supine to Sit     Supine to sit: Min assist;Min guard     General bed mobility comments: assisted back to bed  Transfers Overall transfer level: Needs assistance Equipment used: Rolling walker (2 wheeled) Transfers: Sit to/from Stand Sit to Stand: Min assist;Min guard         General transfer comment: cues for hand placement and LLE position  Ambulation/Gait Ambulation/Gait assistance: Min guard;Min assist Ambulation Distance (Feet): 47 Feet Assistive device: Rolling walker (2 wheeled) Gait Pattern/deviations: Step-to pattern;Decreased stance time - left Gait velocity: decr   General Gait Details: cues for sequence and RW position, posture   Stairs            Wheelchair Mobility    Modified Rankin (Stroke Patients Only)       Balance                                    Cognition Arousal/Alertness: Awake/alert Behavior During Therapy: WFL for tasks assessed/performed Overall Cognitive Status: Within Functional Limits for tasks assessed                      Exercises      General Comments        Pertinent Vitals/Pain Pain  Assessment: 0-10 Pain Score: 3  Pain Location: L knee Pain Descriptors / Indicators: Aching;Sore Pain Intervention(s): Monitored during session;Repositioned;Ice applied    Home Living                      Prior Function            PT Goals (current goals can now be found in the care plan section) Progress towards PT goals: Progressing toward goals    Frequency    7X/week      PT Plan Current plan remains appropriate    Co-evaluation             End of Session Equipment Utilized During Treatment: Gait belt;Left knee immobilizer Activity Tolerance: Patient tolerated treatment well Patient left: in chair;with call bell/phone within reach;with chair alarm set;with family/visitor present     Time: [id number] PT Time Calculation (min) (ACUTE ONLY): 24 min  Charges:  $Gait Training: 8-22 mins $Therapeutic Activity: 8-22 mins                    G Codes:      Rica Koyanagi  PTA [facility]  Acute  Rehab Pager      [phone]

## 2016-06-07 NOTE — Discharge Instructions (Signed)
Dr. Gaynelle Arabian Total Joint Specialist [facility] [address] [place], [place] [address] [phone]  TOTAL KNEE REPLACEMENT POSTOPERATIVE DIRECTIONS  Knee Rehabilitation, Guidelines Following Surgery  Results after knee surgery are often greatly improved when you follow the exercise, range of motion and muscle strengthening exercises prescribed by your doctor. Safety measures are also important to protect the knee from further injury. Any time any of these exercises cause you to have increased pain or swelling in your knee joint, decrease the amount until you are comfortable again and slowly increase them. If you have problems or questions, call your caregiver or physical therapist for advice.   HOME CARE INSTRUCTIONS  Remove items at home which could result in a fall. This includes throw rugs or furniture in walking pathways.   ICE to the affected knee every three hours for 30 minutes at a time and then as needed for pain and swelling.  Continue to use ice on the knee for pain and swelling from surgery. You may notice swelling that will progress down to the foot and ankle.  This is normal after surgery.  Elevate the leg when you are not up walking on it.    Continue to use the breathing machine which will help keep your temperature down.  It is common for your temperature to cycle up and down following surgery, especially at night when you are not up moving around and exerting yourself.  The breathing machine keeps your lungs expanded and your temperature down.  Do not place pillow under knee, focus on keeping the knee straight while resting  DIET You may resume your previous home diet once your are discharged from the hospital.  DRESSING / WOUND CARE / SHOWERING You may shower 3 days after surgery, but keep the wounds dry during showering.  You may use an occlusive plastic wrap (Press'n Seal for example), NO SOAKING/SUBMERGING IN THE BATHTUB.  If the  bandage gets wet, change with a clean dry gauze.  If the incision gets wet, pat the wound dry with a clean towel. You may start showering once you are discharged home but do not submerge the incision under water. Just pat the incision dry and apply a dry gauze dressing on daily. Change the surgical dressing daily and reapply a dry dressing each time.  ACTIVITY Walk with your walker as instructed. Use walker as long as suggested by your caregivers. Avoid periods of inactivity such as sitting longer than an hour when not asleep. This helps prevent blood clots.  You may resume a sexual relationship in one month or when given the OK by your doctor.  You may return to work once you are cleared by your doctor.  Do not drive a car for 6 weeks or until released by you surgeon.  Do not drive while taking narcotics.  WEIGHT BEARING Weight bearing as tolerated with assist device (walker, cane, etc) as directed, use it as long as suggested by your surgeon or therapist, typically at least 4-6 weeks.  POSTOPERATIVE CONSTIPATION PROTOCOL Constipation - defined medically as fewer than three stools per week and severe constipation as less than one stool per week.  One of the most common issues patients have following surgery is constipation.  Even if you have a regular bowel pattern at home, your normal regimen is likely to be disrupted due to multiple reasons following surgery.  Combination of anesthesia, postoperative narcotics, change in appetite and fluid intake all can affect  your bowels.  In order to avoid complications following surgery, here are some recommendations in order to help you during your recovery period.  Colace (docusate) - Pick up an over-the-counter form of Colace or another stool softener and take twice a day as long as you are requiring postoperative pain medications.  Take with a full glass of water daily.  If you experience loose stools or diarrhea, hold the colace until you stool forms  back up.  If your symptoms do not get better within 1 week or if they get worse, check with your doctor.  Dulcolax (bisacodyl) - Pick up over-the-counter and take as directed by the product packaging as needed to assist with the movement of your bowels.  Take with a full glass of water.  Use this product as needed if not relieved by Colace only.   MiraLax (polyethylene glycol) - Pick up over-the-counter to have on hand.  MiraLax is a solution that will increase the amount of water in your bowels to assist with bowel movements.  Take as directed and can mix with a glass of water, juice, soda, coffee, or tea.  Take if you go more than two days without a movement. Do not use MiraLax more than once per day. Call your doctor if you are still constipated or irregular after using this medication for 7 days in a row.  If you continue to have problems with postoperative constipation, please contact the office for further assistance and recommendations.  If you experience "the worst abdominal pain ever" or develop nausea or vomiting, please contact the office immediatly for further recommendations for treatment.  ITCHING  If you experience itching with your medications, try taking only a single pain pill, or even half a pain pill at a time.  You can also use Benadryl over the counter for itching or also to help with sleep.   TED HOSE STOCKINGS Wear the elastic stockings on both legs for three weeks following surgery during the day but you may remove then at night for sleeping.  MEDICATIONS See your medication summary on the After Visit Summary that the nursing staff will review with you prior to discharge.  You may have some home medications which will be placed on hold until you complete the course of blood thinner medication.  It is important for you to complete the blood thinner medication as prescribed by your surgeon.  Continue your approved medications as instructed at time of  discharge.  PRECAUTIONS If you experience chest pain or shortness of breath - call 911 immediately for transfer to the hospital emergency department.  If you develop a fever greater that 101 F, purulent drainage from wound, increased redness or drainage from wound, foul odor from the wound/dressing, or calf pain - CONTACT YOUR SURGEON.                                                   FOLLOW-UP APPOINTMENTS Make sure you keep all of your appointments after your operation with your surgeon and caregivers. You should call the office at the above phone number and make an appointment for approximately two weeks after the date of your surgery or on the date instructed by your surgeon outlined in the "After Visit Summary".   RANGE OF MOTION AND STRENGTHENING EXERCISES  Rehabilitation of the knee is important following a  knee injury or an operation. After just a few days of immobilization, the muscles of the thigh which control the knee become weakened and shrink (atrophy). Knee exercises are designed to build up the tone and strength of the thigh muscles and to improve knee motion. Often times heat used for twenty to thirty minutes before working out will loosen up your tissues and help with improving the range of motion but do not use heat for the first two weeks following surgery. These exercises can be done on a training (exercise) mat, on the floor, on a table or on a bed. Use what ever works the best and is most comfortable for you Knee exercises include:  Leg Lifts - While your knee is still immobilized in a splint or cast, you can do straight leg raises. Lift the leg to 60 degrees, hold for 3 sec, and slowly lower the leg. Repeat 10-20 times 2-3 times daily. Perform this exercise against resistance later as your knee gets better.  Quad and Hamstring Sets - Tighten up the muscle on the front of the thigh (Quad) and hold for 5-10 sec. Repeat this 10-20 times hourly. Hamstring sets are done by pushing the  foot backward against an object and holding for 5-10 sec. Repeat as with quad sets.   Leg Slides: Lying on your back, slowly slide your foot toward your buttocks, bending your knee up off the floor (only go as far as is comfortable). Then slowly slide your foot back down until your leg is flat on the floor again.  Angel Wings: Lying on your back spread your legs to the side as far apart as you can without causing discomfort.  A rehabilitation program following serious knee injuries can speed recovery and prevent re-injury in the future due to weakened muscles. Contact your doctor or a physical therapist for more information on knee rehabilitation.   IF YOU ARE TRANSFERRED TO A SKILLED REHAB FACILITY If the patient is transferred to a skilled rehab facility following release from the hospital, a list of the current medications will be sent to the facility for the patient to continue.  When discharged from the skilled rehab facility, please have the facility set up the patient's [place] prior to being released. Also, the skilled facility will be responsible for providing the patient with their medications at time of release from the facility to include their pain medication, the muscle relaxants, and their blood thinner medication. If the patient is still at the rehab facility at time of the two week follow up appointment, the skilled rehab facility will also need to assist the patient in arranging follow up appointment in our office and any transportation needs.  MAKE SURE YOU:  Understand these instructions.  Get help right away if you are not doing well or get worse.    Pick up stool softner and laxative for home use following surgery while on pain medications. Do not submerge incision under water. Please use good hand washing techniques while changing dressing each day. May shower starting three days after surgery. Please use a clean towel to pat the incision dry following  showers. Continue to use ice for pain and swelling after surgery. Do not use any lotions or creams on the incision until instructed by your surgeon.  Take Xarelto for two and a half more weeks, then discontinue Xarelto. Once the patient has completed the blood thinner regimen, then take a Baby 81 mg Aspirin daily for three more weeks.  Information on my medicine - XARELTO (Rivaroxaban)  This medication education was reviewed with me or my healthcare representative as part of my discharge preparation.  The pharmacist that spoke with me during my hospital stay was:  Emiliano Dyer, RPH  Why was Xarelto prescribed for you? Xarelto was prescribed for you to reduce the risk of blood clots forming after orthopedic surgery. The medical term for these abnormal blood clots is venous thromboembolism (VTE).  What do you need to know about xarelto ? Take your Xarelto ONCE DAILY at the same time every day. You may take it either with or without food.  If you have difficulty swallowing the tablet whole, you may crush it and mix in applesauce just prior to taking your dose.  Take Xarelto exactly as prescribed by your doctor and DO NOT stop taking Xarelto without talking to the doctor who prescribed the medication.  Stopping without other VTE prevention medication to take the place of Xarelto may increase your risk of developing a clot.  After discharge, you should have regular check-up appointments with your healthcare provider that is prescribing your Xarelto.    What do you do if you miss a dose? If you miss a dose, take it as soon as you remember on the same day then continue your regularly scheduled once daily regimen the next day. Do not take two doses of Xarelto on the same day.   Important Safety Information A possible side effect of Xarelto is bleeding. You should call your healthcare provider right away if you experience any of the following: ? Bleeding from an injury or your  nose that does not stop. ? Unusual colored urine (red or dark brown) or unusual colored stools (red or black). ? Unusual bruising for unknown reasons. ? A serious fall or if you hit your head (even if there is no bleeding).  Some medicines may interact with Xarelto and might increase your risk of bleeding while on Xarelto. To help avoid this, consult your healthcare provider or pharmacist prior to using any new prescription or non-prescription medications, including herbals, vitamins, non-steroidal anti-inflammatory drugs (NSAIDs) and supplements.  This website has more information on Xarelto: [email or web address]

## 2016-06-07 NOTE — Progress Notes (Signed)
Physical Therapy Treatment Patient Details Name: Tonya Graves MRN: [id number] DOB: 1951-07-21 Today's Date: 06/07/2016    History of Present Illness s/p L TKA; PMHx:  R TKA    PT Comments    POD # 1 Applied KI and instructed on use for amb.  Assisted OOB to amb a limited distance in hallway.  Performed all supine TKR TE's followed by ICE.   Follow Up Recommendations  Home health PT;Outpatient PT     Equipment Recommendations  Rolling walker with 5" wheels    Recommendations for Other Services       Precautions / Restrictions Precautions Precautions: Fall;Knee Required Braces or Orthoses: Knee Immobilizer - Left Knee Immobilizer - Left: Discontinue once straight leg raise with < 10 degree lag Restrictions Weight Bearing Restrictions: No    Mobility  Bed Mobility Overal bed mobility: Needs Assistance Bed Mobility: Supine to Sit     Supine to sit: Min assist;Min guard     General bed mobility comments: incr time, assist with LLE and scooting  Transfers Overall transfer level: Needs assistance Equipment used: Rolling walker (2 wheeled) Transfers: Sit to/from Stand Sit to Stand: Min assist;Min guard         General transfer comment: cues for hand placement and LLE position  Ambulation/Gait Ambulation/Gait assistance: Min guard;Min assist Ambulation Distance (Feet): 25 Feet Assistive device: Rolling walker (2 wheeled) Gait Pattern/deviations: Step-to pattern;Decreased stance time - left Gait velocity: decr   General Gait Details: cues for sequence and RW position, posture   Stairs            Wheelchair Mobility    Modified Rankin (Stroke Patients Only)       Balance                                    Cognition Arousal/Alertness: Awake/alert Behavior During Therapy: WFL for tasks assessed/performed Overall Cognitive Status: Within Functional Limits for tasks assessed                      Exercises   Total Knee  Replacement TE's 10 reps B LE ankle pumps 10 reps towel squeezes 10 reps knee presses 10 reps heel slides  10 reps SAQ's 10 reps SLR's 10 reps ABD Followed by ICE     General Comments        Pertinent Vitals/Pain Pain Assessment: 0-10 Pain Score: 3  Pain Location: L knee Pain Descriptors / Indicators: Aching;Sore Pain Intervention(s): Monitored during session;Repositioned;Ice applied    Home Living                      Prior Function            PT Goals (current goals can now be found in the care plan section) Progress towards PT goals: Progressing toward goals    Frequency    7X/week      PT Plan Current plan remains appropriate    Co-evaluation             End of Session Equipment Utilized During Treatment: Gait belt;Left knee immobilizer Activity Tolerance: Patient tolerated treatment well Patient left: in chair;with call bell/phone within reach;with chair alarm set;with family/visitor present     Time: [id number] PT Time Calculation (min) (ACUTE ONLY): 25 min  Charges:  $Gait Training: 8-22 mins $Therapeutic Exercise: 8-22 mins  G Codes:      Rica Koyanagi  PTA [facility]  Acute  Rehab Pager      [phone]

## 2016-06-08 LAB — CBC
HCT: 31.6 % — ABNORMAL LOW (ref 36.0–46.0)
Hemoglobin: 10.4 g/dL — ABNORMAL LOW (ref 12.0–15.0)
MCH: 27.9 pg (ref 26.0–34.0)
MCHC: 32.9 g/dL (ref 30.0–36.0)
MCV: 84.7 fL (ref 78.0–100.0)
Platelets: 218 10*3/uL (ref 150–400)
RBC: 3.73 MIL/uL — ABNORMAL LOW (ref 3.87–5.11)
RDW: 14 % (ref 11.5–15.5)
WBC: 8.1 10*3/uL (ref 4.0–10.5)

## 2016-06-08 LAB — BASIC METABOLIC PANEL
Anion gap: 5 (ref 5–15)
BUN: 12 mg/dL (ref 6–20)
CO2: 29 mmol/L (ref 22–32)
Calcium: 8.5 mg/dL — ABNORMAL LOW (ref 8.9–10.3)
Chloride: 105 mmol/L (ref 101–111)
Creatinine, Ser: 0.58 mg/dL (ref 0.44–1.00)
GFR calc Af Amer: >60 mL/min (ref >60)
GFR calc non Af Amer: >60 mL/min (ref >60)
Glucose, Bld: 137 mg/dL — ABNORMAL HIGH (ref 65–99)
Potassium: 3.7 mmol/L (ref 3.5–5.1)
Sodium: 139 mmol/L (ref 135–145)

## 2016-06-08 NOTE — Progress Notes (Signed)
Physical Therapy Treatment Patient Details Name: Tonya Graves MRN: [id number] DOB: [birth year] Today's Date: 06/08/2016    History of Present Illness s/p L TKA; PMHx:  R TKA    PT Comments    POD # 2 Assisted with amb a greater distance in hallway then performed all supine TKR TE's following HEP handout.  Instructed on proper tech, freq as well as use of ICE.   Follow Up Recommendations  Home health PT;Outpatient PT     Equipment Recommendations  Rolling walker with 5" wheels    Recommendations for Other Services       Precautions / Restrictions Precautions Precautions: Fall;Knee Restrictions Weight Bearing Restrictions: No    Mobility  Bed Mobility               General bed mobility comments: OOB in recliner  Transfers Overall transfer level: Needs assistance Equipment used: Rolling walker (2 wheeled) Transfers: Sit to/from Stand           General transfer comment: good safety cognition and use of hands to steady self  Ambulation/Gait Ambulation/Gait assistance: Supervision;Min guard Ambulation Distance (Feet): 58 Feet Assistive device: Rolling walker (2 wheeled) Gait Pattern/deviations: Step-to pattern;Step-through pattern Gait velocity: decr   General Gait Details: one VC safety with turn completion and backward gait   Stairs Stairs:  (no stairs to enter home)          Wheelchair Mobility    Modified Rankin (Stroke Patients Only)       Balance                                    Cognition Arousal/Alertness: Awake/alert Behavior During Therapy: WFL for tasks assessed/performed Overall Cognitive Status: Within Functional Limits for tasks assessed                      Exercises   Total Knee Replacement TE's 10 reps B LE ankle pumps 10 reps towel squeezes 10 reps knee presses 10 reps heel slides  10 reps SAQ's 10 reps SLR's 10 reps ABD Followed by ICE    General Comments        Pertinent  Vitals/Pain Pain Assessment: 0-10 Pain Score: 4  Pain Location: L knee Pain Descriptors / Indicators: Aching;Sore Pain Intervention(s): Monitored during session;Repositioned;Ice applied    Home Living                      Prior Function            PT Goals (current goals can now be found in the care plan section) Progress towards PT goals: Progressing toward goals    Frequency    7X/week      PT Plan Current plan remains appropriate    Co-evaluation             End of Session Equipment Utilized During Treatment: Gait belt Activity Tolerance: Patient tolerated treatment well Patient left: in chair;with call bell/phone within reach;with family/visitor present     Time: [id number] PT Time Calculation (min) (ACUTE ONLY): 29 min  Charges:  $Gait Training: 8-22 mins $Therapeutic Exercise: 8-22 mins                    G Codes:      Rica Koyanagi  PTA [facility]  Acute  Rehab Pager      [phone]

## 2016-06-08 NOTE — Care Management Note (Addendum)
Case Management Note  Patient Details  Name: Tonya Graves MRN: [id number] Date of Birth: 10/03/1950  Subjective/Objective:                  LEFT TOTAL KNEE ARTHROPLASTY (Left) Action/Plan: Discharge planning Expected Discharge Date:  06/08/16               Expected Discharge Plan:  [place]  In-House Referral:     Discharge planning Services  CM Consult  Post Acute Care Choice:  Home Health Choice offered to:  Patient  DME Arranged:  N/A DME Agency:  NA  HH Arranged:  PT [place] Agency:  [facility] (formerly [facility])  Status of Service:  Completed, signed off  If discussed at [employer], dates discussed:    Additional Comments: CM met with pt in room to offer choice of home health agency.  Pt chooses [facility] to render HHPT.  Referral given to [facility], Tim with daughter's contact information as pt is staying with daughter for recuperation/HHPT.  Daughter is Annalee Genta [phone] [place] [phone]. Pt has all DME from previous surgery.  No other CM needs were communicated. Dellie Catholic, RN 06/08/2016, 2:13 PM

## 2016-06-08 NOTE — Progress Notes (Signed)
   Subjective: 2 Days Post-Op Procedure(s) (LRB): LEFT TOTAL KNEE ARTHROPLASTY (Left) Patient reports pain as mild.   Patient seen in rounds with Dr. Wynelle Link. Patient is well, but has had some minor complaints of pain in the knee, requiring pain medications Patient is ready to go home  Objective: Vital signs in last 24 hours: Temp:  [97.9 F (36.6 C)-98.8 F (37.1 C)] 98.8 F (37.1 C) (10/04 0530) Pulse Rate:  [72-96] 96 (10/04 0530) Resp:  [18] 18 (10/04 0530) BP: (129-145)/(61-72) 145/72 (10/04 0530) SpO2:  [94 %-99 %] 94 % (10/04 0530)  Intake/Output from previous day:  Intake/Output Summary (Last 24 hours) at 06/08/16 0752 Last data filed at 06/08/16 0600  Gross per 24 hour  Intake           1217.5 ml  Output             1325 ml  Net           -107.5 ml    Intake/Output this shift: No intake/output data recorded.  Labs:  Recent Labs  06/07/16 0426 06/08/16 0430  HGB 10.4* 10.4*    Recent Labs  06/07/16 0426 06/08/16 0430  WBC 7.3 8.1  RBC 3.59* 3.73*  HCT 31.6* 31.6*  PLT 205 218    Recent Labs  06/07/16 0426 06/08/16 0430  NA 137 139  K 3.8 3.7  CL 107 105  CO2 25 29  BUN 12 12  CREATININE 0.64 0.58  GLUCOSE 151* 137*  CALCIUM 8.1* 8.5*   No results for input(s): LABPT, INR in the last 72 hours.  EXAM: General - Patient is Alert, Appropriate and Oriented Extremity - Neurovascular intact Sensation intact distally Dorsiflexion/Plantar flexion intact Incision - clean, dry, no drainage Motor Function - intact, moving foot and toes well on exam.   Assessment/Plan: 2 Days Post-Op Procedure(s) (LRB): LEFT TOTAL KNEE ARTHROPLASTY (Left) Procedure(s) (LRB): LEFT TOTAL KNEE ARTHROPLASTY (Left) Past Medical History:  Diagnosis Date  . Anemia YEARS AGO  . Arthritis    OA  . Bronchitis   . 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 Cataracts, bilateral   . GERD (gastroesophageal reflux disease)   . Heart  palpitations 2016 august  . High cholesterol   . History of hiatal hernia   . History of palpitations    NONE IN LAST FEW YEARS   . Osteopenia   . Prediabetes   . Urine frequency   . UTI (urinary tract infection)   . Varicose veins    Active Problems:   OA (osteoarthritis) of knee  Estimated body mass index is 37.6 kg/m as calculated from the following:   Height as of this encounter: 5\' 1"  (1.549 m).   Weight as of this encounter: 90.3 kg (199 lb). Up with therapy Discharge home with home health Diet - Cardiac diet Follow up - in 2 weeks Activity - WBAT Disposition - Home Condition Upon Discharge - Good D/C Meds - See DC Summary DVT Prophylaxis - Xarelto  Arlee Muslim, PA-C Orthopaedic Surgery 06/08/2016, 7:52 AM

## 2017-03-28 DIAGNOSIS — N3000 Acute cystitis without hematuria: Secondary | ICD-10-CM | POA: Diagnosis not present

## 2017-03-28 DIAGNOSIS — R799 Abnormal finding of blood chemistry, unspecified: Secondary | ICD-10-CM | POA: Diagnosis not present

## 2017-03-28 DIAGNOSIS — Z6839 Body mass index (BMI) 39.0-39.9, adult: Secondary | ICD-10-CM | POA: Diagnosis not present

## 2017-03-28 DIAGNOSIS — R5383 Other fatigue: Secondary | ICD-10-CM | POA: Diagnosis not present

## 2017-03-28 DIAGNOSIS — E782 Mixed hyperlipidemia: Secondary | ICD-10-CM | POA: Diagnosis not present

## 2017-03-28 DIAGNOSIS — Z0000 Encounter for general adult medical examination without abnormal findings: Secondary | ICD-10-CM | POA: Diagnosis not present

## 2017-03-28 DIAGNOSIS — Z23 Encounter for immunization: Secondary | ICD-10-CM | POA: Diagnosis not present

## 2017-05-17 DIAGNOSIS — Z01419 Encounter for gynecological examination (general) (routine) without abnormal findings: Secondary | ICD-10-CM | POA: Diagnosis not present

## 2017-05-17 DIAGNOSIS — Z1231 Encounter for screening mammogram for malignant neoplasm of breast: Secondary | ICD-10-CM | POA: Diagnosis not present

## 2017-05-17 DIAGNOSIS — Z6836 Body mass index (BMI) 36.0-36.9, adult: Secondary | ICD-10-CM | POA: Diagnosis not present

## 2017-05-19 DIAGNOSIS — Z961 Presence of intraocular lens: Secondary | ICD-10-CM | POA: Diagnosis not present

## 2017-05-19 DIAGNOSIS — E119 Type 2 diabetes mellitus without complications: Secondary | ICD-10-CM | POA: Diagnosis not present

## 2017-05-25 DIAGNOSIS — M1712 Unilateral primary osteoarthritis, left knee: Secondary | ICD-10-CM | POA: Diagnosis not present

## 2017-05-25 DIAGNOSIS — Z471 Aftercare following joint replacement surgery: Secondary | ICD-10-CM | POA: Diagnosis not present

## 2017-05-25 DIAGNOSIS — Z96651 Presence of right artificial knee joint: Secondary | ICD-10-CM | POA: Diagnosis not present

## 2017-05-25 DIAGNOSIS — Z96652 Presence of left artificial knee joint: Secondary | ICD-10-CM | POA: Diagnosis not present

## 2017-05-25 DIAGNOSIS — M1711 Unilateral primary osteoarthritis, right knee: Secondary | ICD-10-CM | POA: Diagnosis not present

## 2017-05-25 DIAGNOSIS — Z96653 Presence of artificial knee joint, bilateral: Secondary | ICD-10-CM | POA: Diagnosis not present

## 2017-06-12 DIAGNOSIS — Z23 Encounter for immunization: Secondary | ICD-10-CM | POA: Diagnosis not present

## 2017-08-08 DIAGNOSIS — N760 Acute vaginitis: Secondary | ICD-10-CM | POA: Diagnosis not present

## 2017-08-08 DIAGNOSIS — N950 Postmenopausal bleeding: Secondary | ICD-10-CM | POA: Diagnosis not present

## 2017-09-12 DIAGNOSIS — E559 Vitamin D deficiency, unspecified: Secondary | ICD-10-CM | POA: Diagnosis not present

## 2017-09-12 DIAGNOSIS — E782 Mixed hyperlipidemia: Secondary | ICD-10-CM | POA: Diagnosis not present

## 2017-09-12 DIAGNOSIS — R5383 Other fatigue: Secondary | ICD-10-CM | POA: Diagnosis not present

## 2017-09-12 DIAGNOSIS — K210 Gastro-esophageal reflux disease with esophagitis: Secondary | ICD-10-CM | POA: Diagnosis not present

## 2017-09-12 DIAGNOSIS — R002 Palpitations: Secondary | ICD-10-CM | POA: Diagnosis not present

## 2017-09-12 DIAGNOSIS — R7301 Impaired fasting glucose: Secondary | ICD-10-CM | POA: Diagnosis not present

## 2017-09-20 ENCOUNTER — Ambulatory Visit (INDEPENDENT_AMBULATORY_CARE_PROVIDER_SITE_OTHER): Payer: PPO | Admitting: Cardiovascular Disease

## 2017-09-20 ENCOUNTER — Encounter (INDEPENDENT_AMBULATORY_CARE_PROVIDER_SITE_OTHER): Payer: Self-pay

## 2017-09-20 ENCOUNTER — Encounter: Payer: Self-pay | Admitting: Cardiovascular Disease

## 2017-09-20 DIAGNOSIS — R002 Palpitations: Secondary | ICD-10-CM | POA: Diagnosis not present

## 2017-09-20 NOTE — Patient Instructions (Signed)
Medication Instructions: Your physician recommends that you continue on your current medications as directed. Please refer to the Current Medication list given to you today.   Testing/Procedures: Your physician has recommended that you wear a 14 day event monitor. Event monitors are medical devices that record the heart's electrical activity. Doctors most often [place] these monitors to diagnose arrhythmias. Arrhythmias are problems with the speed or rhythm of the heartbeat. The monitor is a small, portable device. You can wear one while you do your normal daily activities. This is usually used to diagnose what is causing palpitations/syncope (passing out).  Your physician has requested that you have an echocardiogram. Echocardiography is a painless test that uses sound waves to create images of your heart. It provides your doctor with information about the size and shape of your heart and how well your heart's chambers and valves are working. This procedure takes approximately one hour. There are no restrictions for this procedure.  Follow-Up: Your physician recommends that you schedule a follow-up appointment in: 1 month with Dr. Gwenlyn Found.

## 2017-09-20 NOTE — Assessment & Plan Note (Signed)
Tonya Graves is referred by Dr. Tobie Poet for evaluation of palpitations. She has seen Dr. Bettina Gavia remotely for similar symptoms related to stress and he prescribed when necessary metoprolol which he never ended up taking. Apparently her mother-in-law moved in their home at the end of December and she is noticed palpitations since. I'm going to 2-D echo and a 2 week event monitor and see her back after that for further evaluation.

## 2017-09-20 NOTE — Progress Notes (Signed)
09/20/2017 Tonya Graves   23-Jan-1951  [id number]  Primary Physician Cox, Elnita Maxwell, MD Primary Cardiologist: [name] MD [name], [place]  HPI:  Tonya Graves is a 67 y.o. mildly overweight married Caucasian female mother 2, grandmother and 3 grandchildren referred to me by Dr. Tobie Poet for cardiology evaluation because of palpitations. She has seen Dr. Bettina Gavia remotely for similar symptoms that occur during stressful times in her life and was prescribed when necessary metoprolol. She has no cardiac risk factors. She's never had a heart attack or stroke. She has occasional atypical chest pain with does not sound cardiac. Apparently her mother-in-law moved in with them towards the end of December and she has developed palpitations since that time .   Current Meds  Medication Sig  . Calcium Carbonate-Vitamin D (CALCIUM 500 + D PO) Take 1 tablet by mouth 2 (two) times daily.  . Ergocalciferol (VITAMIN D2 PO) Take 1 tablet by mouth once a week.  . Hypromellose (ARTIFICIAL TEARS OP) Place 1 drop into both eyes daily as needed. Dry eyes  . Multiple Vitamin (MULTIVITAMIN WITH MINERALS) TABS tablet Take 1 tablet by mouth daily.  . naproxen sodium (ALEVE) 220 MG tablet Take by mouth as needed.  Marland Kitchen omeprazole (PRILOSEC) 20 MG capsule Take 20 mg by mouth daily.  . Red Yeast Rice 600 MG TABS Take 1 tablet by mouth daily.     No Known Allergies  Social History   Socioeconomic History  . Marital status: Married    Spouse name: Not on file  . Number of children: Not on file  . Years of education: Not on file  . Highest education level: Not on file  Social Needs  . Financial resource strain: Not on file  . Food insecurity - worry: Not on file  . Food insecurity - inability: Not on file  . Transportation needs - medical: Not on file  . Transportation needs - non-medical: Not on file  Occupational History  . Not on file  Tobacco Use  . Smoking status: Never Smoker  .  Smokeless tobacco: Never Used  Substance and Sexual Activity  . Alcohol use: No  . Drug use: No  . Sexual activity: Not on file  Other Topics Concern  . Not on file  Social History Narrative  . Not on file     Review of Systems: General: negative for chills, fever, night sweats or weight changes.  Cardiovascular: negative for chest pain, dyspnea on exertion, edema, orthopnea, palpitations, paroxysmal nocturnal dyspnea or shortness of breath Dermatological: negative for rash Respiratory: negative for cough or wheezing Urologic: negative for hematuria Abdominal: negative for nausea, vomiting, diarrhea, bright red blood per rectum, melena, or hematemesis Neurologic: negative for visual changes, syncope, or dizziness All other systems reviewed and are otherwise negative except as noted above.    Blood pressure 128/68, pulse 79, height 5\' 1"  (1.549 m), weight 198 lb (89.8 kg).  General appearance: alert and no distress Neck: no adenopathy, no carotid bruit, no JVD, supple, symmetrical, trachea midline and thyroid not enlarged, symmetric, no tenderness/mass/nodules Lungs: clear to auscultation bilaterally Heart: regular rate and rhythm, S1, S2 normal, no murmur, click, rub or gallop Extremities: extremities normal, atraumatic, no cyanosis or edema Pulses: 2+ and symmetric Skin: Skin color, texture, turgor normal. No rashes or lesions Neurologic: Alert and oriented X 3, normal strength and tone. Normal symmetric reflexes. Normal coordination and gait  EKG sinus rhythm at 79 without ST or T-wave  changes. I Personally reviewed this EKG.  ASSESSMENT AND PLAN:   Palpitations [name] is referred by Dr. Tobie Poet for evaluation of palpitations. She has seen Dr. Bettina Gavia remotely for similar symptoms related to stress and he prescribed when necessary metoprolol which he never ended up taking. Apparently her mother-in-law moved in their home at the end of December and she is noticed palpitations  since. I'm going to 2-D echo and a 2 week event monitor and see her back after that for further evaluation.      [name] MD FACP,FACC,FAHA, [facility] 09/20/2017 12:17 PM

## 2017-09-26 DIAGNOSIS — N950 Postmenopausal bleeding: Secondary | ICD-10-CM | POA: Diagnosis not present

## 2017-09-26 DIAGNOSIS — N840 Polyp of corpus uteri: Secondary | ICD-10-CM | POA: Diagnosis not present

## 2017-09-26 DIAGNOSIS — N939 Abnormal uterine and vaginal bleeding, unspecified: Secondary | ICD-10-CM | POA: Diagnosis not present

## 2017-10-03 ENCOUNTER — Other Ambulatory Visit: Payer: Self-pay

## 2017-10-03 ENCOUNTER — Ambulatory Visit (INDEPENDENT_AMBULATORY_CARE_PROVIDER_SITE_OTHER): Payer: PPO

## 2017-10-03 ENCOUNTER — Ambulatory Visit (HOSPITAL_COMMUNITY): Payer: PPO | Attending: Cardiovascular Disease

## 2017-10-03 DIAGNOSIS — R002 Palpitations: Secondary | ICD-10-CM

## 2017-10-03 DIAGNOSIS — E669 Obesity, unspecified: Secondary | ICD-10-CM | POA: Insufficient documentation

## 2017-10-03 DIAGNOSIS — Z6837 Body mass index (BMI) 37.0-37.9, adult: Secondary | ICD-10-CM | POA: Diagnosis not present

## 2017-10-03 DIAGNOSIS — I071 Rheumatic tricuspid insufficiency: Secondary | ICD-10-CM | POA: Diagnosis not present

## 2017-10-31 ENCOUNTER — Encounter: Payer: Self-pay | Admitting: Cardiovascular Disease

## 2017-10-31 ENCOUNTER — Ambulatory Visit: Payer: PPO | Admitting: Cardiovascular Disease

## 2017-10-31 DIAGNOSIS — R002 Palpitations: Secondary | ICD-10-CM | POA: Diagnosis not present

## 2017-10-31 NOTE — Assessment & Plan Note (Signed)
Tonya Graves returns today for follow-up for outpatient tests including a 2-D echo which wasn't entirely normal and an event monitor that showed occasional PACs with short atrial runs. Her palpitations have improved since limiting her caffeine. She seems to think that her palpitations related stress revolving around her mother in law moving into her home. I'll see her back in here for follow-up.

## 2017-10-31 NOTE — Patient Instructions (Signed)

## 2017-10-31 NOTE — Progress Notes (Signed)
Tonya Graves returns today for follow-up for outpatient tests including a 2-D echo which wasn't entirely normal and an event monitor that showed occasional PACs with short atrial runs. Her palpitations have improved since limiting her caffeine. She seems to think that her palpitations related stress revolving around her mother in law moving into her home. I'll see her back in here for follow-up.  [name], M.D., [place], [facility], [place] [address] [place], [place]  [id number]  [phone] 10/31/2017 10:37 AM

## 2017-12-12 DIAGNOSIS — R002 Palpitations: Secondary | ICD-10-CM | POA: Diagnosis not present

## 2017-12-12 DIAGNOSIS — R799 Abnormal finding of blood chemistry, unspecified: Secondary | ICD-10-CM | POA: Diagnosis not present

## 2017-12-12 DIAGNOSIS — R7301 Impaired fasting glucose: Secondary | ICD-10-CM | POA: Diagnosis not present

## 2017-12-12 DIAGNOSIS — E782 Mixed hyperlipidemia: Secondary | ICD-10-CM | POA: Diagnosis not present

## 2017-12-12 DIAGNOSIS — E559 Vitamin D deficiency, unspecified: Secondary | ICD-10-CM | POA: Diagnosis not present

## 2017-12-12 DIAGNOSIS — J060 Acute laryngopharyngitis: Secondary | ICD-10-CM | POA: Diagnosis not present

## 2017-12-12 DIAGNOSIS — K210 Gastro-esophageal reflux disease with esophagitis: Secondary | ICD-10-CM | POA: Diagnosis not present

## 2018-03-19 DIAGNOSIS — J060 Acute laryngopharyngitis: Secondary | ICD-10-CM | POA: Diagnosis not present

## 2018-04-16 DIAGNOSIS — N3001 Acute cystitis with hematuria: Secondary | ICD-10-CM | POA: Diagnosis not present

## 2018-04-16 DIAGNOSIS — N3000 Acute cystitis without hematuria: Secondary | ICD-10-CM | POA: Diagnosis not present

## 2018-04-16 DIAGNOSIS — J060 Acute laryngopharyngitis: Secondary | ICD-10-CM | POA: Diagnosis not present

## 2018-06-13 DIAGNOSIS — R799 Abnormal finding of blood chemistry, unspecified: Secondary | ICD-10-CM | POA: Diagnosis not present

## 2018-06-13 DIAGNOSIS — R7301 Impaired fasting glucose: Secondary | ICD-10-CM | POA: Diagnosis not present

## 2018-06-13 DIAGNOSIS — E782 Mixed hyperlipidemia: Secondary | ICD-10-CM | POA: Diagnosis not present

## 2018-06-13 DIAGNOSIS — K210 Gastro-esophageal reflux disease with esophagitis: Secondary | ICD-10-CM | POA: Diagnosis not present

## 2018-06-13 DIAGNOSIS — E559 Vitamin D deficiency, unspecified: Secondary | ICD-10-CM | POA: Diagnosis not present

## 2018-06-13 DIAGNOSIS — Z23 Encounter for immunization: Secondary | ICD-10-CM | POA: Diagnosis not present

## 2018-06-13 DIAGNOSIS — R5383 Other fatigue: Secondary | ICD-10-CM | POA: Diagnosis not present

## 2018-06-26 DIAGNOSIS — Z1231 Encounter for screening mammogram for malignant neoplasm of breast: Secondary | ICD-10-CM | POA: Diagnosis not present

## 2018-06-26 DIAGNOSIS — Z124 Encounter for screening for malignant neoplasm of cervix: Secondary | ICD-10-CM | POA: Diagnosis not present

## 2018-06-26 DIAGNOSIS — Z6836 Body mass index (BMI) 36.0-36.9, adult: Secondary | ICD-10-CM | POA: Diagnosis not present

## 2018-06-26 DIAGNOSIS — Z01419 Encounter for gynecological examination (general) (routine) without abnormal findings: Secondary | ICD-10-CM | POA: Diagnosis not present

## 2018-08-10 DIAGNOSIS — J208 Acute bronchitis due to other specified organisms: Secondary | ICD-10-CM | POA: Diagnosis not present

## 2018-08-10 DIAGNOSIS — J018 Other acute sinusitis: Secondary | ICD-10-CM | POA: Diagnosis not present

## 2018-09-03 DIAGNOSIS — Z6838 Body mass index (BMI) 38.0-38.9, adult: Secondary | ICD-10-CM | POA: Diagnosis not present

## 2018-09-03 DIAGNOSIS — Z13820 Encounter for screening for osteoporosis: Secondary | ICD-10-CM | POA: Diagnosis not present

## 2018-09-03 DIAGNOSIS — Z0000 Encounter for general adult medical examination without abnormal findings: Secondary | ICD-10-CM | POA: Diagnosis not present

## 2018-10-01 DIAGNOSIS — M8588 Other specified disorders of bone density and structure, other site: Secondary | ICD-10-CM | POA: Diagnosis not present

## 2018-10-01 DIAGNOSIS — M8589 Other specified disorders of bone density and structure, multiple sites: Secondary | ICD-10-CM | POA: Diagnosis not present

## 2018-10-01 DIAGNOSIS — N959 Unspecified menopausal and perimenopausal disorder: Secondary | ICD-10-CM | POA: Diagnosis not present

## 2018-10-01 DIAGNOSIS — M85851 Other specified disorders of bone density and structure, right thigh: Secondary | ICD-10-CM | POA: Diagnosis not present

## 2018-10-01 LAB — HM DEXA SCAN

## 2018-10-31 ENCOUNTER — Ambulatory Visit: Payer: PPO | Admitting: Cardiovascular Disease

## 2018-10-31 ENCOUNTER — Encounter: Payer: Self-pay | Admitting: Cardiovascular Disease

## 2018-10-31 DIAGNOSIS — R002 Palpitations: Secondary | ICD-10-CM

## 2018-10-31 NOTE — Progress Notes (Signed)
10/31/2018 Tonya Graves   Feb 22, 1951  [id number]  Primary Physician Cox, Elnita Maxwell, MD Primary Cardiologist: [name] MD [name], [place]  HPI:  Tonya Graves is a 68 y.o.  mildly overweight married Caucasian female mother 2, grandmother and 3 grandchildren referred to me by Dr. Tobie Poet for cardiology evaluation because of palpitations.  I last saw her in the office 09/20/2017 she has seen Dr. Bettina Gavia remotely for similar symptoms that occur during stressful times in her life and was prescribed when necessary metoprolol. She has no cardiac risk factors. She's never had a heart attack or stroke. She has occasional atypical chest pain with does not sound cardiac. Apparently her mother-in-law moved in with them towards the end of December 2018 which caused a lot of stress at that time.  Unfortunately, she has since passed away 12-21-2022 of last year and her mother has subsequent passed away as well.  I did perform an event monitor which showed sinus rhythm with PACs and short atrial runs and a 2D echo that was entirely normal.  Since I saw her a year ago her symptoms have markedly improved.  Current Meds  Medication Sig  . Calcium Carbonate-Vitamin D (CALCIUM 500 + D PO) Take 1 tablet by mouth 2 (two) times daily.  . Ergocalciferol (VITAMIN D2 PO) Take 1 tablet by mouth once a week.  . Hypromellose (ARTIFICIAL TEARS OP) Place 1 drop into both eyes daily as needed. Dry eyes  . ibuprofen (ADVIL,MOTRIN) 200 MG tablet Take 1 tablet by mouth as directed.  . Multiple Vitamin (MULTIVITAMIN WITH MINERALS) TABS tablet Take 1 tablet by mouth daily.  Marland Kitchen omeprazole (PRILOSEC) 20 MG capsule Take 20 mg by mouth daily.  . Red Yeast Rice 600 MG TABS Take 1 tablet by mouth daily.     No Known Allergies  Social History   Socioeconomic History  . Marital status: Married    Spouse name: Not on file  . Number of children: Not on file  . Years of education: Not on file  . Highest education level:  Not on file  Occupational History  . Not on file  Social Needs  . Financial resource strain: Not on file  . Food insecurity:    Worry: Not on file    Inability: Not on file  . Transportation needs:    Medical: Not on file    Non-medical: Not on file  Tobacco Use  . Smoking status: Never Smoker  . Smokeless tobacco: Never Used  Substance and Sexual Activity  . Alcohol use: No  . Drug use: No  . Sexual activity: Not on file  Lifestyle  . Physical activity:    Days per week: Not on file    Minutes per session: Not on file  . Stress: Not on file  Relationships  . Social connections:    Talks on phone: Not on file    Gets together: Not on file    Attends religious service: Not on file    Active member of club or organization: Not on file    Attends meetings of clubs or organizations: Not on file    Relationship status: Not on file  . Intimate partner violence:    Fear of current or ex partner: Not on file    Emotionally abused: Not on file    Physically abused: Not on file    Forced sexual activity: Not on file  Other Topics Concern  . Not on file  Social History Narrative  . Not on file     Review of Systems: General: negative for chills, fever, night sweats or weight changes.  Cardiovascular: negative for chest pain, dyspnea on exertion, edema, orthopnea, palpitations, paroxysmal nocturnal dyspnea or shortness of breath Dermatological: negative for rash Respiratory: negative for cough or wheezing Urologic: negative for hematuria Abdominal: negative for nausea, vomiting, diarrhea, bright red blood per rectum, melena, or hematemesis Neurologic: negative for visual changes, syncope, or dizziness All other systems reviewed and are otherwise negative except as noted above.    Blood pressure (!) 142/84, pulse 71, height 5\' 1"  (1.549 m), weight 202 lb 12.8 oz (92 kg).  General appearance: alert and no distress Neck: no adenopathy, no carotid bruit, no JVD, supple,  symmetrical, trachea midline and thyroid not enlarged, symmetric, no tenderness/mass/nodules Lungs: clear to auscultation bilaterally Heart: regular rate and rhythm, S1, S2 normal, no murmur, click, rub or gallop Extremities: extremities normal, atraumatic, no cyanosis or edema Pulses: 2+ and symmetric Skin: Skin color, texture, turgor normal. No rashes or lesions Neurologic: Alert and oriented X 3, normal strength and tone. Normal symmetric reflexes. Normal coordination and gait  EKG sinus rhythm at 71 with left axis deviation and borderline voltage for LVH.  I personally reviewed this EKG.  ASSESSMENT AND PLAN:   Palpitations Ms. Meder is being seen back for a year follow-up.  She was initially seen for palpitations which in retrospect I suspect was related to stress at home.  Her mother-in-law who had moved in with her has since passed away last December 01, 2022 and her mother recently passed away as well.  Her palpitations have almost completely resolved.  An event monitor was performed that showed PACs and short atrial runs and a 2D echo was entirely normal.  I will see her back as needed.      [name] MD FACP,FACC,FAHA, [facility] 10/31/2018 11:33 AM

## 2018-10-31 NOTE — Patient Instructions (Signed)
Medication Instructions:  Your physician recommends that you continue on your current medications as directed. Please refer to the Current Medication list given to you today.  If you need a refill on your cardiac medications before your next appointment, please call your pharmacy.   Lab work: NONE If you have labs (blood work) drawn today and your tests are completely normal, you will receive your results only by: . MyChart Message (if you have MyChart) OR . A paper copy in the mail If you have any lab test that is abnormal or we need to change your treatment, we will call you to review the results.  Testing/Procedures: NONE  Follow-Up: At [facility], you and your health needs are our priority.  As part of our continuing mission to provide you with exceptional heart care, we have created designated Provider Care Teams.  These Care Teams include your primary Cardiologist (physician) and Advanced Practice Providers (APPs -  Physician Assistants and Nurse Practitioners) who all work together to provide you with the care you need, when you need it. . You may schedule a follow up appointment AS NEEDED. You may see Dr. Berry or one of the following Advanced Practice Providers on your designated Care Team:   . Luke Kilroy, PA-C . Hao Meng, PA-C . Angela Duke, PA-C . Kathryn Lawrence, DNP . Rhonda Barrett, PA-C . Krista Kroeger, PA-C . Callie Goodrich, PA-C    

## 2018-10-31 NOTE — Assessment & Plan Note (Signed)
Tonya Graves is being seen back for a year follow-up.  She was initially seen for palpitations which in retrospect I suspect was related to stress at home.  Her mother-in-law who had moved in with her has since passed away last 12/19/2022 and her mother recently passed away as well.  Her palpitations have almost completely resolved.  An event monitor was performed that showed PACs and short atrial runs and a 2D echo was entirely normal.  I will see her back as needed.

## 2018-11-15 DIAGNOSIS — R1084 Generalized abdominal pain: Secondary | ICD-10-CM | POA: Diagnosis not present

## 2018-11-15 DIAGNOSIS — R10816 Epigastric abdominal tenderness: Secondary | ICD-10-CM | POA: Diagnosis not present

## 2018-11-20 DIAGNOSIS — R10816 Epigastric abdominal tenderness: Secondary | ICD-10-CM | POA: Diagnosis not present

## 2018-11-20 DIAGNOSIS — R1010 Upper abdominal pain, unspecified: Secondary | ICD-10-CM | POA: Diagnosis not present

## 2018-12-19 DIAGNOSIS — K210 Gastro-esophageal reflux disease with esophagitis: Secondary | ICD-10-CM | POA: Diagnosis not present

## 2018-12-19 DIAGNOSIS — R7301 Impaired fasting glucose: Secondary | ICD-10-CM | POA: Diagnosis not present

## 2018-12-19 DIAGNOSIS — E782 Mixed hyperlipidemia: Secondary | ICD-10-CM | POA: Diagnosis not present

## 2019-01-22 DIAGNOSIS — R5383 Other fatigue: Secondary | ICD-10-CM | POA: Diagnosis not present

## 2019-01-22 DIAGNOSIS — E559 Vitamin D deficiency, unspecified: Secondary | ICD-10-CM | POA: Diagnosis not present

## 2019-01-22 DIAGNOSIS — R799 Abnormal finding of blood chemistry, unspecified: Secondary | ICD-10-CM | POA: Diagnosis not present

## 2019-01-22 DIAGNOSIS — E782 Mixed hyperlipidemia: Secondary | ICD-10-CM | POA: Diagnosis not present

## 2019-06-11 DIAGNOSIS — B029 Zoster without complications: Secondary | ICD-10-CM | POA: Diagnosis not present

## 2019-06-13 DIAGNOSIS — B0233 Zoster keratitis: Secondary | ICD-10-CM | POA: Diagnosis not present

## 2019-06-26 DIAGNOSIS — Z23 Encounter for immunization: Secondary | ICD-10-CM | POA: Diagnosis not present

## 2019-07-04 DIAGNOSIS — R5383 Other fatigue: Secondary | ICD-10-CM | POA: Diagnosis not present

## 2019-07-04 DIAGNOSIS — B029 Zoster without complications: Secondary | ICD-10-CM | POA: Diagnosis not present

## 2019-07-04 DIAGNOSIS — R0789 Other chest pain: Secondary | ICD-10-CM | POA: Diagnosis not present

## 2019-07-05 DIAGNOSIS — Z1231 Encounter for screening mammogram for malignant neoplasm of breast: Secondary | ICD-10-CM | POA: Diagnosis not present

## 2019-07-05 LAB — HM MAMMOGRAPHY

## 2019-07-31 ENCOUNTER — Other Ambulatory Visit: Payer: Self-pay

## 2019-08-06 DIAGNOSIS — I4729 Other ventricular tachycardia: Secondary | ICD-10-CM | POA: Insufficient documentation

## 2019-08-06 DIAGNOSIS — I472 Ventricular tachycardia, unspecified: Secondary | ICD-10-CM

## 2019-08-06 HISTORY — DX: Other ventricular tachycardia: I47.29

## 2019-08-06 HISTORY — DX: Ventricular tachycardia, unspecified: I47.20

## 2019-08-06 HISTORY — DX: Ventricular tachycardia: I47.2

## 2019-08-08 ENCOUNTER — Telehealth: Payer: Self-pay | Admitting: Cardiovascular Disease

## 2019-08-08 NOTE — Telephone Encounter (Signed)
Rescheduled pt's appt to 08/20/19 at 9:00 am with Dr Gwenlyn Found due to covid exposure on Monday 11/30 /20 .Adonis Housekeeper

## 2019-08-08 NOTE — Telephone Encounter (Signed)
Patient states she was exposed to someone with covid. She would like her appt tomorrow with Dr. Gwenlyn Found to be virtual. Please advise if this is possible.

## 2019-08-09 ENCOUNTER — Ambulatory Visit: Payer: PPO | Admitting: Cardiovascular Disease

## 2019-08-20 ENCOUNTER — Encounter: Payer: Self-pay | Admitting: Cardiovascular Disease

## 2019-08-20 ENCOUNTER — Encounter: Payer: Self-pay | Admitting: *Deleted

## 2019-08-20 ENCOUNTER — Other Ambulatory Visit: Payer: Self-pay

## 2019-08-20 ENCOUNTER — Ambulatory Visit: Payer: PPO | Admitting: Cardiovascular Disease

## 2019-08-20 VITALS — BP 138/76 | HR 81 | Temp 97.8°F | Ht 62.0 in | Wt 201.0 lb

## 2019-08-20 DIAGNOSIS — R002 Palpitations: Secondary | ICD-10-CM

## 2019-08-20 NOTE — Progress Notes (Signed)
Patient ID: Tonya Graves, female   DOB: 12/21/1950, 68 y.o.   MRN: [id number] Patient enrolled for [employer] to ship a 30 day cardiac event monitor to her home.

## 2019-08-20 NOTE — Progress Notes (Signed)
08/20/2019 Tonya Graves   02-19-1951  [id number]  Primary Physician Cox, Elnita Maxwell, MD Primary Cardiologist: [name] MD [name], [place]  HPI:  Tonya Graves is a 68 y.o.   mildly overweight married Caucasian female mother 2, grandmother and 3 grandchildren referred to me by Dr. Tobie Poet for cardiology evaluation because of palpitations.  I last saw her in the office 10/31/2018 she has seen Dr. Bettina Gavia remotely for similar symptoms that occur during stressful times in her life and was prescribed when necessary metoprolol. She has no cardiac risk factors. She's never had a heart attack or stroke. She has occasional atypical chest pain with does not sound cardiac. Apparently her mother-in-law moved in with them towards the end of December 2018 which caused a lot of stress at that time.  Unfortunately, she has since passed away 12-28-2022 of last year and her mother has subsequent passed away as well.  I did perform an event monitor which showed sinus rhythm with PACs and short atrial runs and a 2D echo that was entirely normal.    Since I saw her a year ago she is done well until recently when she has noticed some tachypalpitations with heart rates up in the 1 40-1 70 range for no apparent reason.  She also has complained of some increasing fatigue.  Current Meds  Medication Sig  . Calcium Carbonate-Vitamin D (CALCIUM 500 + D PO) Take 1 tablet by mouth 2 (two) times daily.  . Ergocalciferol (VITAMIN D2 PO) Take 1 tablet by mouth once a week.  . Hypromellose (ARTIFICIAL TEARS OP) Place 1 drop into both eyes daily as needed. Dry eyes  . ibuprofen (ADVIL,MOTRIN) 200 MG tablet Take 1 tablet by mouth as directed.  . Multiple Vitamin (MULTIVITAMIN WITH MINERALS) TABS tablet Take 1 tablet by mouth daily.  Marland Kitchen omeprazole (PRILOSEC) 20 MG capsule Take 20 mg by mouth daily.  . Red Yeast Rice 600 MG TABS Take 1 tablet by mouth daily.     No Known Allergies  Social History   Socioeconomic  History  . Marital status: Married    Spouse name: Not on file  . Number of children: Not on file  . Years of education: Not on file  . Highest education level: Not on file  Occupational History  . Not on file  Tobacco Use  . Smoking status: Never Smoker  . Smokeless tobacco: Never Used  Substance and Sexual Activity  . Alcohol use: No  . Drug use: No  . Sexual activity: Not on file  Other Topics Concern  . Not on file  Social History Narrative  . Not on file   Social Determinants of Health   Financial Resource Strain:   . Difficulty of Paying Living Expenses: Not on file  Food Insecurity:   . Worried About Charity fundraiser in the Last Year: Not on file  . Ran Out of Food in the Last Year: Not on file  Transportation Needs:   . Lack of Transportation (Medical): Not on file  . Lack of Transportation (Non-Medical): Not on file  Physical Activity:   . Days of Exercise per Week: Not on file  . Minutes of Exercise per Session: Not on file  Stress:   . Feeling of Stress : Not on file  Social Connections:   . Frequency of Communication with Friends and Family: Not on file  . Frequency of Social Gatherings with Friends and Family: Not on file  .  Attends Religious Services: Not on file  . Active Member of Clubs or Organizations: Not on file  . Attends Archivist Meetings: Not on file  . Marital Status: Not on file  Intimate Partner Violence:   . Fear of Current or Ex-Partner: Not on file  . Emotionally Abused: Not on file  . Physically Abused: Not on file  . Sexually Abused: Not on file     Review of Systems: General: negative for chills, fever, night sweats or weight changes.  Cardiovascular: negative for chest pain, dyspnea on exertion, edema, orthopnea, palpitations, paroxysmal nocturnal dyspnea or shortness of breath Dermatological: negative for rash Respiratory: negative for cough or wheezing Urologic: negative for hematuria Abdominal: negative for  nausea, vomiting, diarrhea, bright red blood per rectum, melena, or hematemesis Neurologic: negative for visual changes, syncope, or dizziness All other systems reviewed and are otherwise negative except as noted above.    Blood pressure 138/76, pulse 81, temperature 97.8 F (36.6 C), height 5\' 2"  (1.575 m), weight 201 lb (91.2 kg).  General appearance: alert and no distress Neck: no adenopathy, no carotid bruit, no JVD, supple, symmetrical, trachea midline and thyroid not enlarged, symmetric, no tenderness/mass/nodules Lungs: clear to auscultation bilaterally Heart: regular rate and rhythm, S1, S2 normal, no murmur, click, rub or gallop Extremities: extremities normal, atraumatic, no cyanosis or edema Pulses: 2+ and symmetric Skin: Skin color, texture, turgor normal. No rashes or lesions Neurologic: Alert and oriented X 3, normal strength and tone. Normal symmetric reflexes. Normal coordination and gait  EKG sinus rhythm at 81 with left axis deviation and septal Q waves with borderline LVH.  I personally reviewed this EKG.  ASSESSMENT AND PLAN:   Palpitations Ms. Behmer had tachypalpitations recently with heart rates up in the 1 40-1 70 range and.  She seen her primary provider who is done blood work.  She does have a normal 2D echo performed earlier this year and a previous event monitor that showed sinus rhythm with occasional PACs and atrial runs.  And to repeat a 30-day event monitor to further evaluate.      [name] MD FACP,FACC,FAHA, [facility] 08/20/2019 9:38 AM

## 2019-08-20 NOTE — Patient Instructions (Signed)
Medication Instructions:  Your physician recommends that you continue on your current medications as directed. Please refer to the Current Medication list given to you today.  If you need a refill on your cardiac medications before your next appointment, please call your pharmacy.   Lab work: NONE  Testing/Procedures: Your physician has recommended that you wear an event monitor. Event monitors are medical devices that record the heart's electrical activity. Doctors most often [place] these monitors to diagnose arrhythmias. Arrhythmias are problems with the speed or rhythm of the heartbeat. The monitor is a small, portable device. You can wear one while you do your normal daily activities. This is usually used to diagnose what is causing palpitations/syncope (passing out).   Follow-Up: At [facility], you and your health needs are our priority.  As part of our continuing mission to provide you with exceptional heart care, we have created designated Provider Care Teams.  These Care Teams include your primary Cardiologist (physician) and Advanced Practice Providers (APPs -  Physician Assistants and Nurse Practitioners) who all work together to provide you with the care you need, when you need it. You may see Dr Gwenlyn Found or one of the following Advanced Practice Providers on your designated Care Team:    Kerin Ransom, PA-C  [place], [place]  Coletta Memos, [place] Your physician wants you to follow-up in: 3 months with a Surveyor, [place]. Your physician wants you to follow-up in: 1 year with Dr Gwenlyn Found    Any Other Special Instructions Will Be Listed Below (If Applicable).  [employer] Cardiac Event Monitor Instructions Your physician has requested you wear your cardiac event monitor for _____ days, (1-30). [employer] may call or text to confirm a shipping address. The monitor will be sent to a land address via UPS. [employer] will not ship a monitor to a PO BOX. It typically takes 3-5 days  to receive your monitor after it has been enrolled. [employer] will assist with USPS tracking if your package is delayed. The telephone number for [employer] is [phone]. Once you have received your monitor, please review the enclosed instructions. Instruction tutorials can also be viewed under help and settings on the enclosed cell phone. Your monitor has already been registered assigning a specific monitor serial # to you.  Applying the monitor Remove cell phone from case and turn it on. The cell phone works as Dealer and needs to be within [employer] of you at all times. The cell phone will need to be charged on a daily basis. We recommend you plug the cell phone into the enclosed charger at your bedside table every night.  Monitor batteries: You will receive two monitor batteries labelled #1 and #2. These are your recorders. Plug battery #2 onto the second connection on the enclosed charger. Keep one battery on the charger at all times. This will keep the monitor battery deactivated. It will also keep it fully charged for when you need to switch your monitor batteries. A small light will be blinking on the battery emblem when it is charging. The light on the battery emblem will remain on when the battery is fully charged.  Open package of a Monitor strip. Insert battery #1 into black hood on strip and gently squeeze monitor battery onto connection as indicated in instruction booklet. Set aside while preparing skin.  Choose location for your strip, vertical or horizontal, as indicated in the instruction booklet. Shave to remove all hair from location. There cannot be any lotions, oils, powders, or  colognes on skin where monitor is to be applied. Wipe skin clean with enclosed Saline wipe. Dry skin completely.  Peel paper labeled #1 off the back of the Monitor strip exposing the adhesive. Place the monitor on the chest in the vertical or horizontal position shown in the  instruction booklet. One arrow on the monitor strip must be pointing upward. Carefully remove paper labeled #2, attaching remainder of strip to your skin. Try not to create any folds or wrinkles in the strip as you apply it.  Firmly press and release the circle in the center of the monitor battery. You will hear a small beep. This is turning the monitor battery on. The heart emblem on the monitor battery will light up every 5 seconds if the monitor battery in turned on and connected to the patient securely. Do not push and hold the circle down as this turns the monitor battery off. The cell phone will locate the monitor battery. A screen will appear on the cell phone checking the connection of your monitor strip. This may read poor connection initially but change to good connection within the next minute. Once your monitor accepts the connection you will hear a series of 3 beeps followed by a climbing crescendo of beeps. A screen will appear on the cell phone showing the two monitor strip placement options. Touch the picture that demonstrates where you applied the monitor strip.  Your monitor strip and battery are waterproof. You are able to shower, bathe, or swim with the monitor on. They just ask you do not submerge deeper than 3 feet underwater. We recommend removing the monitor if you are swimming in a lake, river, or ocean.  Your monitor battery will need to be switched to a fully charged monitor battery approximately once a week. The cell phone will alert you of an action which needs to be made.  On the cell phone, tap for details to reveal connection status, monitor battery status, and cell phone battery status. The green dots indicates your monitor is in good status. A red dot indicates there is something that needs your attention.  To record a symptom, click the circle on the monitor battery. In 30-60 seconds a list of symptoms will appear on the cell phone. Select your symptom and  tap save. Your monitor will record a sustained or significant arrhythmia regardless of you clicking the button. Some patients do not feel the heart rhythm irregularities. [employer] will notify us of any serious or critical events.  Refer to instruction booklet for instructions on switching batteries, changing strips, the Do not disturb or Pause features, or any additional questions.  Call [employer] at [phone], to confirm your monitor is transmitting and record your baseline. They will answer any questions you may have regarding the monitor instructions at that time.  Returning the monitor to [place] all equipment back into blue box. Peel off strip of paper to expose adhesive and close box securely. There is a prepaid UPS shipping label on this box. Drop in a UPS drop box, or at a UPS facility like [employer]. You may also contact [employer] to arrange UPS to pick up monitor package at your home.

## 2019-08-20 NOTE — Assessment & Plan Note (Signed)
Tonya Graves had tachypalpitations recently with heart rates up in the 1 40-1 70 range and.  She seen her primary provider who is done blood work.  She does have a normal 2D echo performed earlier this year and a previous event monitor that showed sinus rhythm with occasional PACs and atrial runs.  And to repeat a 30-day event monitor to further evaluate.

## 2019-08-28 ENCOUNTER — Encounter (INDEPENDENT_AMBULATORY_CARE_PROVIDER_SITE_OTHER): Payer: PPO

## 2019-08-28 DIAGNOSIS — R002 Palpitations: Secondary | ICD-10-CM

## 2019-11-14 ENCOUNTER — Telehealth (INDEPENDENT_AMBULATORY_CARE_PROVIDER_SITE_OTHER): Payer: PPO | Admitting: Physician Assistant

## 2019-11-14 ENCOUNTER — Encounter: Payer: Self-pay | Admitting: Physician Assistant

## 2019-11-14 VITALS — BP 140/85 | Temp 96.5°F | Wt 194.0 lb

## 2019-11-14 DIAGNOSIS — J060 Acute laryngopharyngitis: Secondary | ICD-10-CM | POA: Insufficient documentation

## 2019-11-14 MED ORDER — CEFDINIR 300 MG PO CAPS: 300.0000 mg | ORAL_CAPSULE | Freq: Two times a day (BID) | ORAL | 0 refills | Status: DC

## 2019-11-14 NOTE — Assessment & Plan Note (Signed)
Continue current otc decongestants rx sent for omnicef Follow up if any symptoms change or worsen

## 2019-11-14 NOTE — Progress Notes (Signed)
Acute Office Visit  Subjective:         Virtual Visit via Telephone Note   This visit type was conducted due to national recommendations for restrictions regarding the COVID-19 Pandemic (e.g. social distancing) in an effort to limit this patient's exposure and mitigate transmission in our community.  Due to her co-morbid illnesses, this patient is at least at moderate risk for complications without adequate follow up.  This format is felt to be most appropriate for this patient at this time.  The patient did not have access to video technology/had technical difficulties with video requiring transitioning to audio format only (telephone).  All issues noted in this document were discussed and addressed.  No physical exam could be performed with this format.  Patient verbally consented to a telehealth visit.   Date:  11/14/2019   ID:  Tonya Graves, DOB 1950-12-01, MRN [id number]  Patient Location: Home Provider Location: Office  PCP:  [name], PA-C     Chief Complaint:  Tonya Graves   History of Present Illness:    Tonya Graves is a 69 y.o. female with uri/sinusitis Pt states that she has had cold and congestion with sinus pressure and drainage for the past several days She denies fever, cough or malaise - has not personally had exposure to COVID  The patient does not have symptoms concerning for COVID-19 infection (fever, chills, cough, or new shortness of breath).    Past Medical History:  Diagnosis Date  . Anemia YEARS AGO  . Arthritis    OA  . Bronchitis   . 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 Cataracts, bilateral   . GERD (gastroesophageal reflux disease)   . Heart palpitations 2016 august  . High cholesterol   . History of hiatal hernia   . History of palpitations    NONE IN LAST FEW YEARS   . Osteopenia   . Prediabetes   . Urine frequency   . UTI (urinary tract infection)   . Varicose veins    Past Surgical History:    Procedure Laterality Date  .  C SECTIONS     X 2  . ORAL SURGERY FOR MOUTH CANCER  06-11-2015  . TENDON REPAIR Right  YEARS AGO   LITTLE FINGER  . TOTAL KNEE ARTHROPLASTY Right 07/13/2015   Procedure: RIGHT TOTAL KNEE ARTHROPLASTY;  Surgeon: Gaynelle Arabian, MD;  Location: [facility];  Service: Orthopedics;  Laterality: Right;  . TOTAL KNEE ARTHROPLASTY Left 06/06/2016   Procedure: LEFT TOTAL KNEE ARTHROPLASTY;  Surgeon: Gaynelle Arabian, MD;  Location: [facility];  Service: Orthopedics;  Laterality: Left;     No outpatient medications have been marked as taking for the 11/14/19 encounter (Video Visit) with [name], PA-C.     Allergies:   Patient has no known allergies.   Social History   Tobacco Use  . Smoking status: Never Smoker  . Smokeless tobacco: Never Used  Substance Use Topics  . Alcohol use: No  . Drug use: No     Family Hx: The patient's family history includes Atrial fibrillation in her mother and sister; Cancer in her maternal grandmother; Heart disease in her father; High Cholesterol in her father; High blood pressure in her mother; Leukemia in her maternal grandmother; Other in her maternal grandfather; Stroke in her father and paternal grandmother.  ROS:   Please see the history of present illness.    All other systems reviewed and  are negative.  Labs/Other Tests and Data Reviewed:    Recent Labs: No results found for requested labs within last 8760 hours.   Recent Lipid Panel No results found for: CHOL, TRIG, HDL, CHOLHDL, LDLCALC, LDLDIRECT  Wt Readings from Last 3 Encounters:  11/14/19 194 lb (88 kg)  08/20/19 201 lb (91.2 kg)  10/31/18 202 lb 12.8 oz (92 kg)     Objective:    Vital Signs:  BP 140/85   Temp (!) 96.5 F (35.8 C)   Wt 194 lb (88 kg)   BMI 35.48 kg/m    VITAL SIGNS:  reviewed  ASSESSMENT & PLAN:    1. URI/sinusitis  COVID-19 Education: The signs and symptoms of COVID-19 were discussed with the patient and how to seek care for  testing (follow up with PCP or arrange E-visit). The importance of social distancing was discussed today.  Time:   Today, I have spent 10 minutes with the patient with telehealth technology discussing the above problems.     Medication Adjustments/Labs and Tests Ordered: Current medicines are reviewed at length with the patient today.  Concerns regarding medicines are outlined above.   Tests Ordered: No orders of the defined types were placed in this encounter.   Medication Changes: Meds ordered this encounter  Medications  . cefdinir (OMNICEF) 300 MG capsule    Sig: Take 1 capsule (300 mg total) by mouth 2 (two) times daily.    Dispense:  20 capsule    Refill:  0    Order Specific Question:   Supervising Provider    AnswerShelton Silvas    Follow Up:  In Person if symptoms persist or worsen  Signed, Webb Silversmith, PA-C  11/14/2019 10:21 AM    [place]

## 2019-11-20 ENCOUNTER — Telehealth: Payer: Self-pay

## 2019-11-20 ENCOUNTER — Telehealth (INDEPENDENT_AMBULATORY_CARE_PROVIDER_SITE_OTHER): Payer: PPO | Admitting: Physician Assistant

## 2019-11-20 ENCOUNTER — Encounter: Payer: Self-pay | Admitting: Physician Assistant

## 2019-11-20 VITALS — BP 122/68 | HR 78 | Temp 97.1°F | Ht 61.0 in | Wt 198.0 lb

## 2019-11-20 DIAGNOSIS — R002 Palpitations: Secondary | ICD-10-CM | POA: Diagnosis not present

## 2019-11-20 MED ORDER — METOPROLOL TARTRATE 25 MG PO TABS: 25.0000 mg | ORAL_TABLET | ORAL | 3 refills | Status: DC | PRN

## 2019-11-20 NOTE — Telephone Encounter (Signed)
  Patient Consent for Virtual Visit         Tonya Graves has provided verbal consent on 11/20/2019 for a virtual visit (video or telephone).   CONSENT FOR VIRTUAL VISIT FOR:  Tonya Graves  By participating in this virtual visit I agree to the following:  I hereby voluntarily request, consent and authorize [facility] and its employed or contracted physicians, physician assistants, nurse practitioners or other licensed health care professionals (the Practitioner), to provide me with telemedicine health care services (the "Services") as deemed necessary by the treating Practitioner. I acknowledge and consent to receive the Services by the Practitioner via telemedicine. I understand that the telemedicine visit will involve communicating with the Practitioner through live audiovisual communication technology and the disclosure of certain medical information by electronic transmission. I acknowledge that I have been given the opportunity to request an in-person assessment or other available alternative prior to the telemedicine visit and am voluntarily participating in the telemedicine visit.  I understand that I have the right to withhold or withdraw my consent to the use of telemedicine in the course of my care at any time, without affecting my right to future care or treatment, and that the Practitioner or I may terminate the telemedicine visit at any time. I understand that I have the right to inspect all information obtained and/or recorded in the course of the telemedicine visit and may receive copies of available information for a reasonable fee.  I understand that some of the potential risks of receiving the Services via telemedicine include:  Marland Kitchen Delay or interruption in medical evaluation due to technological equipment failure or disruption; . Information transmitted may not be sufficient (e.g. poor resolution of images) to allow for appropriate medical decision making by the  Practitioner; and/or  . In rare instances, security protocols could fail, causing a breach of personal health information.  Furthermore, I acknowledge that it is my responsibility to provide information about my medical history, conditions and care that is complete and accurate to the best of my ability. I acknowledge that Practitioner's advice, recommendations, and/or decision may be based on factors not within their control, such as incomplete or inaccurate data provided by me or distortions of diagnostic images or specimens that may result from electronic transmissions. I understand that the practice of medicine is not an exact science and that Practitioner makes no warranties or guarantees regarding treatment outcomes. I acknowledge that a copy of this consent can be made available to me via my patient portal ([facility]), or I can request a printed copy by calling the office of [place].    I understand that my insurance will be billed for this visit.   I have read or had this consent read to me. . I understand the contents of this consent, which adequately explains the benefits and risks of the Services being provided via telemedicine.  . I have been provided ample opportunity to ask questions regarding this consent and the Services and have had my questions answered to my satisfaction. . I give my informed consent for the services to be provided through the use of telemedicine in my medical care

## 2019-11-20 NOTE — Progress Notes (Signed)
Virtual Visit via Telephone Note   This visit type was conducted due to national recommendations for restrictions regarding the COVID-19 Pandemic (e.g. social distancing) in an effort to limit this patient's exposure and mitigate transmission in our community.  Due to her co-morbid illnesses, this patient is at least at moderate risk for complications without adequate follow up.  This format is felt to be most appropriate for this patient at this time.  The patient did not have access to video technology/had technical difficulties with video requiring transitioning to audio format only (telephone).  All issues noted in this document were discussed and addressed.  No physical exam could be performed with this format.  Please refer to the patient's chart for her  consent to telehealth for [facility].   The patient was identified using 2 identifiers.  Date:  11/20/2019   ID:  [name], DOB 24-Oct-1950, MRN [id number]  Patient Location: Home Provider Location: Home  PCP:  [name], PA-C  Cardiologist:  Quay Burow, MD  Electrophysiologist:  None   Evaluation Performed:  Follow-Up Visit  Chief Complaint:  followup  History of Present Illness:    BRITLEY CHIO is a 69 y.o. female with past medical history of palpitation, hyperlipidemia, history of oral cancer, prediabetes and varicose vein.  She has seen by Dr. Bettina Gavia remotely for palpitation during stressful times in her life and the prescribed PRN metoprolol.  Otherwise she does not have history of CAD or CVA.  She occasionally has some atypical chest pain which does not sound cardiac.  Previous heart monitor demonstrated sinus rhythm with PACs and atrial runs, otherwise no significant irregular rhythm.  Previous echocardiogram obtained on 10/03/2017 showed EF 55 to 60%, no regional wall motion abnormality, no foramen ovale identified, otherwise normal valve function.  Patient was most recently seen by Dr. Gwenlyn Found on 08/20/2019  at which time, she complained of increasing fatigue and tachycardia palpitation with heart rate occasionally going up to 140-170s range.  A 30-day event monitor was repeated.  EKG showed primarily sinus rhythm, sinus bradycardia and sinus tachycardia, occasional PACs and PVCs, short run of atrial tachycardia, occasional short 4 beat run of nonsustained VT.  Patient presents today for virtual visit.  I reviewed her recent heart monitor with her, since there was never any sustained ventricular ectopy, I think most of her rhythm fairly benign.  She described her symptom as rare at this time. Very rarely, she has episodes where it interfere with her sleep.  I recommended as needed metoprolol 25 mg to help control her symptoms.  She is scheduled to take her second dose of COVID-19 vaccine Moderna on 3/25.  Otherwise she can follow-up in 6 months.  The patient does not have symptoms concerning for COVID-19 infection (fever, chills, cough, or new shortness of breath).    Past Medical History:  Diagnosis Date  . Anemia YEARS AGO  . Arthritis    OA  . Bronchitis   . 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 Cataracts, bilateral   . GERD (gastroesophageal reflux disease)   . Heart palpitations 2016 august  . High cholesterol   . History of hiatal hernia   . History of palpitations    NONE IN LAST FEW YEARS   . Osteopenia   . Prediabetes   . Urine frequency   . UTI (urinary tract infection)   . Varicose veins    Past Surgical History:  Procedure Laterality Date  .  C SECTIONS     X 2  . ORAL SURGERY FOR MOUTH CANCER  06-11-2015  . TENDON REPAIR Right  YEARS AGO   LITTLE FINGER  . TOTAL KNEE ARTHROPLASTY Right 07/13/2015   Procedure: RIGHT TOTAL KNEE ARTHROPLASTY;  Surgeon: Gaynelle Arabian, MD;  Location: [facility];  Service: Orthopedics;  Laterality: Right;  . TOTAL KNEE ARTHROPLASTY Left 06/06/2016   Procedure: LEFT TOTAL KNEE ARTHROPLASTY;  Surgeon: Gaynelle Arabian, MD;   Location: [facility];  Service: Orthopedics;  Laterality: Left;     Current Meds  Medication Sig  . Calcium Carbonate-Vitamin D (CALCIUM 500 + D PO) Take 1 tablet by mouth 2 (two) times daily.  . cefdinir (OMNICEF) 300 MG capsule Take 1 capsule (300 mg total) by mouth 2 (two) times daily.  Marland Kitchen DEXTROMETHORPHAN-GUAIFENESIN PO Take by mouth as needed (Cough).  . Ergocalciferol (VITAMIN D2 PO) Take 1 tablet by mouth once a week.  . Hypromellose (ARTIFICIAL TEARS OP) Place 1 drop into both eyes daily as needed. Dry eyes  . ibuprofen (ADVIL,MOTRIN) 200 MG tablet Take 1 tablet by mouth as directed.  Marland Kitchen omeprazole (PRILOSEC) 20 MG capsule Take 20 mg by mouth daily.  . Red Yeast Rice 600 MG TABS Take 1 tablet by mouth daily.     Allergies:   Patient has no known allergies.   Social History   Tobacco Use  . Smoking status: Never Smoker  . Smokeless tobacco: Never Used  Substance Use Topics  . Alcohol use: No  . Drug use: No     Family Hx: The patient's family history includes Atrial fibrillation in her mother and sister; Cancer in her maternal grandmother; Heart disease in her father; High Cholesterol in her father; High blood pressure in her mother; Leukemia in her maternal grandmother; Other in her maternal grandfather; Stroke in her father and paternal grandmother.  ROS:   Please see the history of present illness.     All other systems reviewed and are negative.   Prior CV studies:   The following studies were reviewed today:  Event monitor 10/04/2019 1: sinus rhythm/sinus bradycardia/sinus tachycardia 2: Occasional PACs and PVCs 3: Short runs of atrial tachycardia 4: Occasional short 4 beat runs of nonsustained ventricular tachycardia.  Labs/Other Tests and Data Reviewed:    EKG:  An ECG dated 08/20/2019 was personally reviewed today and demonstrated:  Normal sinus rhythm, no significant ST-T wave changes.  Recent Labs: No results found for requested labs within last 8760 hours.    Recent Lipid Panel No results found for: CHOL, TRIG, HDL, CHOLHDL, LDLCALC, LDLDIRECT  Wt Readings from Last 3 Encounters:  11/20/19 198 lb (89.8 kg)  11/14/19 194 lb (88 kg)  08/20/19 201 lb (91.2 kg)     Objective:    Vital Signs:  BP 122/68 (BP Location: Right Arm, Patient Position: Sitting)   Pulse 78   Temp (!) 97.1 F (36.2 C)   Ht 5\' 1"  (1.549 m)   Wt 198 lb (89.8 kg)   BMI 37.41 kg/m    VITAL SIGNS:  reviewed  ASSESSMENT & PLAN:    1. Palpitation: Recent heart monitor showed PVC and PACs, single 4 beat run of nonsustained VT.  She does not have any prolonged ventricular ectopy.  She denies any chest pain.  I recommend as needed dose of metoprolol tartrate 25 mg for prolonged rhythm issue.  Otherwise, I think she is fairly stable and can follow-up in 6 months.  COVID-19 Education: The signs and symptoms of COVID-19 were discussed with the patient and how to seek care for testing (follow up with PCP or arrange E-visit).  The importance of social distancing was discussed today.  Time:   Today, I have spent 16 minutes with the patient with telehealth technology discussing the above problems.     Medication Adjustments/Labs and Tests Ordered: Current medicines are reviewed at length with the patient today.  Concerns regarding medicines are outlined above.   Tests Ordered: No orders of the defined types were placed in this encounter.   Medication Changes: No orders of the defined types were placed in this encounter.   Follow Up:  Either In Person or Virtual in 6 month(s)  Signed, Almyra Deforest, [place]  11/20/2019 11:16 AM    [place]

## 2019-11-20 NOTE — Patient Instructions (Addendum)
Medication Instructions:   May take Metoprolol Tartrate (Lopressor) 25 mg as needed for palpitations *If you need a refill on your cardiac medications before your next appointment, please call your pharmacy*  Lab Work: NONE ordered at this time of appointment   If you have labs (blood work) drawn today and your tests are completely normal, you will receive your results only by: Marland Kitchen MyChart Message (if you have MyChart) OR . A paper copy in the mail If you have any lab test that is abnormal or we need to change your treatment, we will call you to review the results.  Testing/Procedures: NONE ordered at this time of appointment   Follow-Up: At [facility], you and your health needs are our priority.  As part of our continuing mission to provide you with exceptional heart care, we have created designated Provider Care Teams.  These Care Teams include your primary Cardiologist (physician) and Advanced Practice Providers (APPs -  Physician Assistants and Nurse Practitioners) who all work together to provide you with the care you need, when you need it.  We recommend signing up for the patient portal called "MyChart".  Sign up information is provided on this After Visit Summary.  MyChart is used to connect with patients for Virtual Visits (Telemedicine).  Patients are able to view lab/test results, encounter notes, upcoming appointments, etc.  Non-urgent messages can be sent to your provider as well.   To learn more about what you can do with MyChart, go to [email or web address].    Your next appointment:   6 month(s)  The format for your next appointment:   In Person  Provider:   Quay Burow, MD  Other Instructions

## 2019-11-20 NOTE — Telephone Encounter (Signed)
Called patient to discuss AVS instructions gave Hao Meng's recommendations and patient voiced understanding. AVS summary mailed to patient.    

## 2019-11-25 ENCOUNTER — Encounter: Payer: Self-pay | Admitting: Family Medicine

## 2020-01-06 DIAGNOSIS — N950 Postmenopausal bleeding: Secondary | ICD-10-CM | POA: Diagnosis not present

## 2020-02-14 ENCOUNTER — Other Ambulatory Visit: Payer: Self-pay | Admitting: Physician Assistant

## 2020-02-25 ENCOUNTER — Ambulatory Visit: Payer: PPO

## 2020-02-25 ENCOUNTER — Other Ambulatory Visit: Payer: Self-pay | Admitting: Physician Assistant

## 2020-02-25 ENCOUNTER — Encounter: Payer: Self-pay | Admitting: Physician Assistant

## 2020-02-25 ENCOUNTER — Ambulatory Visit (INDEPENDENT_AMBULATORY_CARE_PROVIDER_SITE_OTHER): Payer: PPO | Admitting: Physician Assistant

## 2020-02-25 ENCOUNTER — Other Ambulatory Visit: Payer: Self-pay

## 2020-02-25 VITALS — BP 110/72 | HR 74 | Temp 97.7°F | Ht 62.0 in | Wt 198.4 lb

## 2020-02-25 DIAGNOSIS — Z0000 Encounter for general adult medical examination without abnormal findings: Secondary | ICD-10-CM | POA: Diagnosis not present

## 2020-02-25 DIAGNOSIS — E669 Obesity, unspecified: Secondary | ICD-10-CM | POA: Insufficient documentation

## 2020-02-25 DIAGNOSIS — E782 Mixed hyperlipidemia: Secondary | ICD-10-CM

## 2020-02-25 DIAGNOSIS — E66812 Obesity, class 2: Secondary | ICD-10-CM | POA: Insufficient documentation

## 2020-02-25 DIAGNOSIS — G4700 Insomnia, unspecified: Secondary | ICD-10-CM | POA: Insufficient documentation

## 2020-02-25 DIAGNOSIS — R5383 Other fatigue: Secondary | ICD-10-CM

## 2020-02-25 DIAGNOSIS — E559 Vitamin D deficiency, unspecified: Secondary | ICD-10-CM

## 2020-02-25 MED ORDER — CYCLOBENZAPRINE HCL 5 MG PO TABS: 5.0000 mg | ORAL_TABLET | Freq: Every day | ORAL | 1 refills | Status: DC

## 2020-02-25 NOTE — Progress Notes (Signed)
Wellness physical  Subjective:    Patient ID: Tonya Graves, female    DOB: 03-23-1951, 69 y.o.   MRN: [id number]  Chief Complaint  Patient presents with  . Annual Exam    Medicare AWV    HPI Patient is in today for [facility] wellness .   Encounter for general adult medical examination without abnormal findings  Physical ("At Risk" items are starred): Patient's last physical exam was 1 year ago .  Blood Pressure: Normal (BP less than 120/80) ;  Medical History: Patient history reviewed ; Family history reviewed ;  Allergies Reviewed: No change in current allergies ;  Medications Reviewed: Medications reviewed - no changes ;  Lipids: Normal lipid levels ;  Smoking: Life-long non-smoker ;  Alcohol/Drug Use: Is a non-drinker ; No illicit drug use ;  Patient is not afflicted from Stress Incontinence and Urge Incontinence  Safety: reviewed ; Patient wears a seat belt, has smoke detectors, has carbon monoxide detectors, practices appropriate gun safety, and wears sunscreen with extended sun exposure. Dental Care: biannual cleanings, brushes and flosses daily. Ophthalmology/Optometry: Annual visit.  Hearing loss: none Vision impairments: none  Past Medical History:  Diagnosis Date  . Anemia YEARS AGO  . Arthritis    OA  . Bronchitis   . 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 Cataracts, bilateral   . GERD (gastroesophageal reflux disease)   . Heart palpitations 2016 august  . High cholesterol   . History of hiatal hernia   . History of palpitations    NONE IN LAST FEW YEARS   . Osteopenia   . Prediabetes   . Urine frequency   . UTI (urinary tract infection)   . Varicose veins   . Ventricular tachycardia (paroxysmal) ([place]) 08/2019   30 day monitor.    Past Surgical History:  Procedure Laterality Date  .  C SECTIONS     X 2  . ORAL SURGERY FOR MOUTH CANCER  06-11-2015  . TENDON REPAIR Right  YEARS AGO   LITTLE FINGER  . TOTAL KNEE  ARTHROPLASTY Right 07/13/2015   Procedure: RIGHT TOTAL KNEE ARTHROPLASTY;  Surgeon: Gaynelle Arabian, MD;  Location: [facility];  Service: Orthopedics;  Laterality: Right;  . TOTAL KNEE ARTHROPLASTY Left 06/06/2016   Procedure: LEFT TOTAL KNEE ARTHROPLASTY;  Surgeon: Gaynelle Arabian, MD;  Location: [facility];  Service: Orthopedics;  Laterality: Left;    Family History  Problem Relation Age of Onset  . High blood pressure Mother   . Atrial fibrillation Mother   . Stroke Father   . Heart disease Father   . High Cholesterol Father   . Atrial fibrillation Sister   . Cancer Maternal Grandmother   . Leukemia Maternal Grandmother   . Other Maternal Grandfather        [place]y [place] spotted fever  . Stroke Paternal Grandmother     Social History   Socioeconomic History  . Marital status: Married    Spouse name: Not on file  . Number of children: Not on file  . Years of education: Not on file  . Highest education level: Not on file  Occupational History  . Not on file  Tobacco Use  . Smoking status: Never Smoker  . Smokeless tobacco: Never Used  Substance and Sexual Activity  . Alcohol use: No  . Drug use: No  . Sexual activity: Not on file  Other Topics Concern  . Not on file  Social History Narrative  . Not on file   Social Determinants of Health   Financial Resource Strain:   . Difficulty of Paying Living Expenses:   Food Insecurity:   . Worried About Charity fundraiser in the Last Year:   . Arboriculturist in the Last Year:   Transportation Needs:   . Film/video editor (Medical):   Marland Kitchen Lack of Transportation (Non-Medical):   Physical Activity:   . Days of Exercise per Week:   . Minutes of Exercise per Session:   Stress:   . Feeling of Stress :   Social Connections:   . Frequency of Communication with Friends and Family:   . Frequency of Social Gatherings with Friends and Family:   . Attends Religious Services:   . Active Member of Clubs or Organizations:   . Attends English as a second language teacher Meetings:   Marland Kitchen Marital Status:   Intimate Partner Violence:   . Fear of Current or Ex-Partner:   . Emotionally Abused:   Marland Kitchen Physically Abused:   . Sexually Abused:      Current Outpatient Medications:  .  Calcium Carbonate-Vitamin D (CALCIUM 500 + D PO), Take 1 tablet by mouth 2 (two) times daily., Disp: , Rfl:  .  DEXTROMETHORPHAN-GUAIFENESIN PO, Take by mouth as needed (Cough)., Disp: , Rfl:  .  Hypromellose (ARTIFICIAL TEARS OP), Place 1 drop into both eyes daily as needed. Dry eyes, Disp: , Rfl:  .  ibuprofen (ADVIL,MOTRIN) 200 MG tablet, Take 1 tablet by mouth as directed., Disp: , Rfl:  .  metoprolol tartrate (LOPRESSOR) 25 MG tablet, TAKE 1 TABLET (25 MG TOTAL) BY MOUTH AS NEEDED (FOR PALPITATIONS)., Disp: 90 tablet, Rfl: 1 .  Multiple Vitamin (MULTIVITAMIN WITH MINERALS) TABS tablet, Take 1 tablet by mouth daily., Disp: , Rfl:  .  omeprazole (PRILOSEC) 20 MG capsule, Take 20 mg by mouth daily., Disp: , Rfl:  .  Red Yeast Rice 600 MG TABS, Take 1 tablet by mouth daily., Disp: , Rfl:  .  Vitamin D, Ergocalciferol, (DRISDOL) 1.25 MG (50000 UNIT) CAPS capsule, Take 50,000 Units by mouth once a week., Disp: , Rfl:    No Known Allergies  Review of Systems CONSTITUTIONAL: Negative for chills, fatigue, fever, unintentional weight gain and unintentional weight loss.  E/N/T: Negative for ear pain, nasal congestion and sore throat.  CARDIOVASCULAR: Negative for chest pain, dizziness, palpitations and pedal edema.  RESPIRATORY: Negative for recent cough and dyspnea.  GASTROINTESTINAL: Negative for abdominal pain, acid reflux symptoms, constipation, diarrhea, nausea and vomiting.  MSK: Negative for arthralgias and myalgias.  INTEGUMENTARY: Negative for rash.  NEUROLOGICAL: Negative for dizziness and headaches.  PSYCHIATRIC: Negative for sleep disturbance and to question depression screen.  Negative for depression, negative for anhedonia.         Objective:    Physical  Exam  PHYSICAL EXAM:   VS: BP 110/72 (BP Location: Left Arm, Patient Position: Sitting)   Pulse 74   Temp 97.7 F (36.5 C) (Temporal)   Ht 5\' 2"  (1.575 m)   Wt 198 lb 6.4 oz (90 kg)   SpO2 98%   BMI 36.29 kg/m   GEN: Well nourished, well developed, in no acute distress  HEENT: normal external ears and nose - normal external auditory canals and TMS - hearing grossly normal - normal - Lips, Teeth and Gums - normal  Oropharynx - normal mucosa, palate, and posterior pharynx Neck: no JVD or masses - no thyromegaly Cardiac: RRR;  no murmurs, rubs, or gallops,no edema - no significant varicosities Respiratory:  normal respiratory rate and pattern with no distress - normal breath sounds with no rales, rhonchi, wheezes or rubs GI: normal bowel sounds, no masses or tenderness MS: no deformity or atrophy  Skin: warm and dry, no rash  Neuro:  Alert and Oriented x 3, Strength and sensation are intact - CN II-Xii grossly intact Psych: euthymic mood, appropriate affect and demeanor  Wt Readings from Last 3 Encounters:  02/25/20 198 lb 6.4 oz (90 kg)  11/20/19 198 lb (89.8 kg)  11/14/19 194 lb (88 kg)    Health Maintenance Due  Topic Date Due  . Hepatitis C Screening  Never done  . URINE MICROALBUMIN  Never done    There are no preventive care reminders to display for this patient.   No results found for: TSH Lab Results  Component Value Date   WBC 8.1 06/08/2016   HGB 10.4 (L) 06/08/2016   HCT 31.6 (L) 06/08/2016   MCV 84.7 06/08/2016   PLT 218 06/08/2016   Lab Results  Component Value Date   NA 139 06/08/2016   K 3.7 06/08/2016   CO2 29 06/08/2016   GLUCOSE 137 (H) 06/08/2016   BUN 12 06/08/2016   CREATININE 0.58 06/08/2016   BILITOT 0.4 05/27/2016   ALKPHOS 55 05/27/2016   AST 22 05/27/2016   ALT 17 05/27/2016   PROT 7.1 05/27/2016   ALBUMIN 3.9 05/27/2016   CALCIUM 8.5 (L) 06/08/2016   ANIONGAP 5 06/08/2016   No results found for: CHOL No results found for:  HDL No results found for: LDLCALC No results found for: TRIG No results found for: CHOLHDL No results found for: HGBA1C     Assessment & Plan:   Problem List Items Addressed This Visit      Other   Routine general medical examination at a health care facility - Primary    Return for labwork Continue current meds as directed Preventive wellness handouts given          No orders of the defined types were placed in this encounter.    SARA R Rosalynn Sergent, PA-C

## 2020-02-25 NOTE — Patient Instructions (Signed)
Preventive Care 38 Years and Older, Female Preventive care refers to lifestyle choices and visits with your health care provider that can promote health and wellness. This includes:  A yearly physical exam. This is also called an annual well check.  Regular dental and eye exams.  Immunizations.  Screening for certain conditions.  Healthy lifestyle choices, such as diet and exercise. What can I expect for my preventive care visit? Physical exam Your health care provider will check:  Height and weight. These may be used to calculate body mass index (BMI), which is a measurement that tells if you are at a healthy weight.  Heart rate and blood pressure.  Your skin for abnormal spots. Counseling Your health care provider may ask you questions about:  Alcohol, tobacco, and drug use.  Emotional well-being.  Home and relationship well-being.  Sexual activity.  Eating habits.  History of falls.  Memory and ability to understand (cognition).  Work and work Statistician.  Pregnancy and menstrual history. What immunizations do I need?  Influenza (flu) vaccine  This is recommended every year. Tetanus, diphtheria, and pertussis (Tdap) vaccine  You may need a Td booster every 10 years. Varicella (chickenpox) vaccine  You may need this vaccine if you have not already been vaccinated. Zoster (shingles) vaccine  You may need this after age 33. Pneumococcal conjugate (PCV13) vaccine  One dose is recommended after age 33. Pneumococcal polysaccharide (PPSV23) vaccine  One dose is recommended after age 72. Measles, mumps, and rubella (MMR) vaccine  You may need at least one dose of MMR if you were born in 1957 or later. You may also need a second dose. Meningococcal conjugate (MenACWY) vaccine  You may need this if you have certain conditions. Hepatitis A vaccine  You may need this if you have certain conditions or if you travel or work in places where you may be exposed  to hepatitis A. Hepatitis B vaccine  You may need this if you have certain conditions or if you travel or work in places where you may be exposed to hepatitis B. Haemophilus influenzae type b (Hib) vaccine  You may need this if you have certain conditions. You may receive vaccines as individual doses or as more than one vaccine together in one shot (combination vaccines). Talk with your health care provider about the risks and benefits of combination vaccines. What tests do I need? Blood tests  Lipid and cholesterol levels. These may be checked every 5 years, or more frequently depending on your overall health.  Hepatitis C test.  Hepatitis B test. Screening  Lung cancer screening. You may have this screening every year starting at age 39 if you have a 30-pack-year history of smoking and currently smoke or have quit within the past 15 years.  Colorectal cancer screening. All adults should have this screening starting at age 36 and continuing until age 15. Your health care provider may recommend screening at age 23 if you are at increased risk. You will have tests every 1-10 years, depending on your results and the type of screening test.  Diabetes screening. This is done by checking your blood sugar (glucose) after you have not eaten for a while (fasting). You may have this done every 1-3 years.  Mammogram. This may be done every 1-2 years. Talk with your health care provider about how often you should have regular mammograms.  BRCA-related cancer screening. This may be done if you have a family history of breast, ovarian, tubal, or peritoneal cancers.  Other tests  Sexually transmitted disease (STD) testing.  Bone density scan. This is done to screen for osteoporosis. You may have this done starting at age 44. Follow these instructions at home: Eating and drinking  Eat a diet that includes fresh fruits and vegetables, whole grains, lean protein, and low-fat dairy products. Limit  your intake of foods with high amounts of sugar, saturated fats, and salt.  Take vitamin and mineral supplements as recommended by your health care provider.  Do not drink alcohol if your health care provider tells you not to drink.  If you drink alcohol: ? Limit how much you have to 0-1 drink a day. ? Be aware of how much alcohol is in your drink. In the [place] one drink equals one 12 oz bottle of beer (355 mL), one 5 oz glass of wine (148 mL), or one 1 oz glass of hard liquor (44 mL). Lifestyle  Take daily care of your teeth and gums.  Stay active. Exercise for at least 30 minutes on 5 or more days each week.  Do not use any products that contain nicotine or tobacco, such as cigarettes, e-cigarettes, and chewing tobacco. If you need help quitting, ask your health care provider.  If you are sexually active, practice safe sex. Use a condom or other form of protection in order to prevent STIs (sexually transmitted infections).  Talk with your health care provider about taking a low-dose aspirin or statin. What's next?  Go to your health care provider once a year for a well check visit.  Ask your health care provider how often you should have your eyes and teeth checked.  Stay up to date on all vaccines. This information is not intended to replace advice given to you by your health care provider. Make sure you discuss any questions you have with your health care provider. Document Revised: 08/16/2018 Document Reviewed: 08/16/2018 Elsevier Patient Education  2020 [employer].

## 2020-02-25 NOTE — Assessment & Plan Note (Signed)
Return for labwork Continue current meds as directed Preventive wellness handouts given

## 2020-02-27 ENCOUNTER — Other Ambulatory Visit: Payer: Self-pay

## 2020-02-27 ENCOUNTER — Other Ambulatory Visit: Payer: PPO

## 2020-02-27 DIAGNOSIS — E782 Mixed hyperlipidemia: Secondary | ICD-10-CM | POA: Diagnosis not present

## 2020-02-27 DIAGNOSIS — E559 Vitamin D deficiency, unspecified: Secondary | ICD-10-CM | POA: Diagnosis not present

## 2020-02-27 DIAGNOSIS — R5383 Other fatigue: Secondary | ICD-10-CM | POA: Diagnosis not present

## 2020-02-28 LAB — COMPREHENSIVE METABOLIC PANEL
ALT: 14 IU/L (ref 0–32)
AST: 22 IU/L (ref 0–40)
Albumin/Globulin Ratio: 1.6 (ref 1.2–2.2)
Albumin: 4.1 g/dL (ref 3.8–4.8)
Alkaline Phosphatase: 69 IU/L (ref 48–121)
BUN/Creatinine Ratio: 17 (ref 12–28)
BUN: 14 mg/dL (ref 8–27)
Bilirubin Total: 0.2 mg/dL (ref 0.0–1.2)
CO2: 24 mmol/L (ref 20–29)
Calcium: 9.4 mg/dL (ref 8.7–10.3)
Chloride: 104 mmol/L (ref 96–106)
Creatinine, Ser: 0.83 mg/dL (ref 0.57–1.00)
GFR calc Af Amer: 84 mL/min/{1.73_m2} (ref >59)
GFR calc non Af Amer: 73 mL/min/{1.73_m2} (ref >59)
Globulin, Total: 2.5 g/dL (ref 1.5–4.5)
Glucose: 98 mg/dL (ref 65–99)
Potassium: 4.4 mmol/L (ref 3.5–5.2)
Sodium: 143 mmol/L (ref 134–144)
Total Protein: 6.6 g/dL (ref 6.0–8.5)

## 2020-02-28 LAB — LIPID PANEL
Chol/HDL Ratio: 2.3 ratio (ref 0.0–4.4)
Cholesterol, Total: 219 mg/dL — ABNORMAL HIGH (ref 100–199)
HDL: 96 mg/dL (ref >39)
LDL Chol Calc (NIH): 112 mg/dL — ABNORMAL HIGH (ref 0–99)
Triglycerides: 65 mg/dL (ref 0–149)
VLDL Cholesterol Cal: 11 mg/dL (ref 5–40)

## 2020-02-28 LAB — CBC WITH DIFFERENTIAL/PLATELET
Basophils Absolute: 0 10*3/uL (ref 0.0–0.2)
Basos: 1 %
EOS (ABSOLUTE): 0.1 10*3/uL (ref 0.0–0.4)
Eos: 2 %
Hematocrit: 37.7 % (ref 34.0–46.6)
Hemoglobin: 12.8 g/dL (ref 11.1–15.9)
Immature Grans (Abs): 0 10*3/uL (ref 0.0–0.1)
Immature Granulocytes: 0 %
Lymphocytes Absolute: 1.8 10*3/uL (ref 0.7–3.1)
Lymphs: 40 %
MCH: 28.7 pg (ref 26.6–33.0)
MCHC: 34 g/dL (ref 31.5–35.7)
MCV: 85 fL (ref 79–97)
Monocytes Absolute: 0.4 10*3/uL (ref 0.1–0.9)
Monocytes: 9 %
Neutrophils Absolute: 2.2 10*3/uL (ref 1.4–7.0)
Neutrophils: 48 %
Platelets: 243 10*3/uL (ref 150–450)
RBC: 4.46 x10E6/uL (ref 3.77–5.28)
RDW: 13.3 % (ref 11.7–15.4)
WBC: 4.6 10*3/uL (ref 3.4–10.8)

## 2020-02-28 LAB — TSH: TSH: 2.86 u[IU]/mL (ref 0.450–4.500)

## 2020-02-28 LAB — VITAMIN D 25 HYDROXY (VIT D DEFICIENCY, FRACTURES): Vit D, 25-Hydroxy: 52 ng/mL (ref 30.0–100.0)

## 2020-02-28 LAB — CARDIOVASCULAR RISK ASSESSMENT

## 2020-02-29 ENCOUNTER — Other Ambulatory Visit: Payer: Self-pay | Admitting: Physician Assistant

## 2020-05-12 ENCOUNTER — Ambulatory Visit (INDEPENDENT_AMBULATORY_CARE_PROVIDER_SITE_OTHER): Payer: PPO

## 2020-05-12 ENCOUNTER — Encounter: Payer: Self-pay | Admitting: Physician Assistant

## 2020-05-12 DIAGNOSIS — Z23 Encounter for immunization: Secondary | ICD-10-CM | POA: Diagnosis not present

## 2020-06-05 ENCOUNTER — Ambulatory Visit: Payer: PPO | Admitting: Cardiovascular Disease

## 2020-06-09 ENCOUNTER — Ambulatory Visit (INDEPENDENT_AMBULATORY_CARE_PROVIDER_SITE_OTHER): Payer: PPO | Admitting: Cardiovascular Disease

## 2020-06-09 ENCOUNTER — Other Ambulatory Visit: Payer: Self-pay

## 2020-06-09 ENCOUNTER — Encounter: Payer: Self-pay | Admitting: Cardiovascular Disease

## 2020-06-09 VITALS — BP 148/72 | HR 73 | Ht 61.0 in | Wt 200.0 lb

## 2020-06-09 DIAGNOSIS — R002 Palpitations: Secondary | ICD-10-CM

## 2020-06-09 DIAGNOSIS — R0600 Dyspnea, unspecified: Secondary | ICD-10-CM

## 2020-06-09 DIAGNOSIS — R0609 Other forms of dyspnea: Secondary | ICD-10-CM

## 2020-06-09 NOTE — Patient Instructions (Signed)
  Follow-Up: At [facility], you and your health needs are our priority.  As part of our continuing mission to provide you with exceptional heart care, we have created designated Provider Care Teams.  These Care Teams include your primary Cardiologist (physician) and Advanced Practice Providers (APPs -  Physician Assistants and Nurse Practitioners) who all work together to provide you with the care you need, when you need it.  We recommend signing up for the patient portal called "MyChart".  Sign up information is provided on this After Visit Summary.  MyChart is used to connect with patients for Virtual Visits (Telemedicine).  Patients are able to view lab/test results, encounter notes, upcoming appointments, etc.  Non-urgent messages can be sent to your provider as well.   To learn more about what you can do with MyChart, go to [email or web address].    Your next appointment:   12 month(s)  The format for your next appointment:   In Person  Provider:   You may see Jonathan Berry, MD or one of the following Advanced Practice Providers on your designated Care Team:    Luke Kilroy, PA-C  Callie Goodrich, PA-C  Jesse Cleaver, FNP    

## 2020-06-09 NOTE — Assessment & Plan Note (Signed)
Patient complains of some dyspnea on exertion.  She is moderately overweight and fairly sedentary.  She does do garden work however.  She had a 2D echocardiogram performed 10/03/2017 showed normal LV systolic function with normal diastolic function and no valvular abnormalities.  I suspect that her dyspnea is related to obesity and deconditioning.  There is no prior history of tobacco abuse.

## 2020-06-09 NOTE — Progress Notes (Signed)
06/09/2020 Tonya Graves   1950/10/13  [id number]  Primary Physician [name], PA-C Primary Cardiologist: [name] MD Tonya Graves, [place]  HPI:  Tonya Graves is a 69 y.o.  mildly overweight married Caucasian female mother 2, grandmother and 3 grandchildren referred to me by Dr. Tobie Poet for cardiology evaluation because of palpitations.I last saw her in the office  08/20/2019 she has seen Dr. Bettina Gavia remotely for similar symptoms that occur during stressful times in her life and was prescribed when necessary metoprolol. She has no cardiac risk factors. She's never had a heart attack or stroke. She has occasional atypical chest pain with does not sound cardiac. Apparently her mother-in-law moved in with them towards the end of December2018 which caused a lot of stress at that time. Unfortunately, she has since passed away 19-Dec-2022 of last year and her mother has subsequent passed away as well. I did perform an event monitor which showed sinus rhythm with PACs and short atrial runs and a 2D echo that was entirely normal.   Since I saw her 2 months ago, she did see Almyra Deforest PA-C in December 19, 2022 who went over her event monitor results with her.  In the last 10 months she is only had 2 episodes of tachypalpitations both of which occurred in the early morning hours.  She was given as needed metoprolol which she did not take.  Current Meds  Medication Sig  . Calcium Carbonate-Vitamin D (CALCIUM 500 + D PO) Take 1 tablet by mouth 2 (two) times daily.  . cyclobenzaprine (FLEXERIL) 5 MG tablet Take 1 tablet (5 mg total) by mouth at bedtime.  . Hypromellose (ARTIFICIAL TEARS OP) Place 1 drop into both eyes daily as needed. Dry eyes  . ibuprofen (ADVIL,MOTRIN) 200 MG tablet Take 1 tablet by mouth as directed.  . metoprolol tartrate (LOPRESSOR) 25 MG tablet TAKE 1 TABLET (25 MG TOTAL) BY MOUTH AS NEEDED (FOR PALPITATIONS).  Marland Kitchen omeprazole (PRILOSEC) 20 MG capsule Take 20 mg by mouth daily.  .  Red Yeast Rice 600 MG TABS Take 1 tablet by mouth daily.  . Vitamin D, Ergocalciferol, (DRISDOL) 1.25 MG (50000 UNIT) CAPS capsule TAKE 1 CAPSULE BY MOUTH ONE TIME PER WEEK     No Known Allergies  Social History   Socioeconomic History  . Marital status: Married    Spouse name: Not on file  . Number of children: Not on file  . Years of education: Not on file  . Highest education level: Not on file  Occupational History  . Not on file  Tobacco Use  . Smoking status: Never Smoker  . Smokeless tobacco: Never Used  Substance and Sexual Activity  . Alcohol use: No  . Drug use: No  . Sexual activity: Not on file  Other Topics Concern  . Not on file  Social History Narrative  . Not on file   Social Determinants of Health   Financial Resource Strain:   . Difficulty of Paying Living Expenses: Not on file  Food Insecurity:   . Worried About Charity fundraiser in the Last Year: Not on file  . Ran Out of Food in the Last Year: Not on file  Transportation Needs:   . Lack of Transportation (Medical): Not on file  . Lack of Transportation (Non-Medical): Not on file  Physical Activity:   . Days of Exercise per Week: Not on file  . Minutes of Exercise per Session: Not on file  Stress:   . Feeling of Stress : Not on file  Social Connections:   . Frequency of Communication with Friends and Family: Not on file  . Frequency of Social Gatherings with Friends and Family: Not on file  . Attends Religious Services: Not on file  . Active Member of Clubs or Organizations: Not on file  . Attends Archivist Meetings: Not on file  . Marital Status: Not on file  Intimate Partner Violence:   . Fear of Current or Ex-Partner: Not on file  . Emotionally Abused: Not on file  . Physically Abused: Not on file  . Sexually Abused: Not on file     Review of Systems: General: negative for chills, fever, night sweats or weight changes.  Cardiovascular: negative for chest pain, dyspnea on  exertion, edema, orthopnea, palpitations, paroxysmal nocturnal dyspnea or shortness of breath Dermatological: negative for rash Respiratory: negative for cough or wheezing Urologic: negative for hematuria Abdominal: negative for nausea, vomiting, diarrhea, bright red blood per rectum, melena, or hematemesis Neurologic: negative for visual changes, syncope, or dizziness All other systems reviewed and are otherwise negative except as noted above.    Blood pressure (!) 148/72, pulse 73, height 5\' 1"  (1.549 m), weight 200 lb (90.7 kg).  General appearance: alert and no distress Neck: no adenopathy, no carotid bruit, no JVD, supple, symmetrical, trachea midline and thyroid not enlarged, symmetric, no tenderness/mass/nodules Lungs: clear to auscultation bilaterally Heart: regular rate and rhythm, S1, S2 normal, no murmur, click, rub or gallop Extremities: extremities normal, atraumatic, no cyanosis or edema Pulses: 2+ and symmetric Skin: Skin color, texture, turgor normal. No rashes or lesions Neurologic: Alert and oriented X 3, normal strength and tone. Normal symmetric reflexes. Normal coordination and gait  EKG sinus rhythm at 73 with nonspecific ST and T wave changes.  I personally reviewed this EKG.  ASSESSMENT AND PLAN:   Palpitations History of palpitations with event monitor performed 08/28/2019 that showed PACs, PVCs, short runs of atrial tach and nonsustained ventricular tachycardia.  These were marked prevalent when her mother-in-law was living with her and under times of stress.  Since I last saw her 10 months ago she has had 2 episodes of tachypalpitations occurring primarily in the early morning hours.  She was given as needed metoprolol which she did not take.  I reassured her that her palpitations are benign.  Dyspnea on exertion Patient complains of some dyspnea on exertion.  She is moderately overweight and fairly sedentary.  She does do garden work however.  She had a 2D  echocardiogram performed 10/03/2017 showed normal LV systolic function with normal diastolic function and no valvular abnormalities.  I suspect that her dyspnea is related to obesity and deconditioning.  There is no prior history of tobacco abuse.      [name] MD FACP,FACC,FAHA, [facility] 06/09/2020 9:22 AM

## 2020-06-09 NOTE — Assessment & Plan Note (Signed)
History of palpitations with event monitor performed 08/28/2019 that showed PACs, PVCs, short runs of atrial tach and nonsustained ventricular tachycardia.  These were marked prevalent when her mother-in-law was living with her and under times of stress.  Since I last saw her 10 months ago she has had 2 episodes of tachypalpitations occurring primarily in the early morning hours.  She was given as needed metoprolol which she did not take.  I reassured her that her palpitations are benign.

## 2020-06-29 DIAGNOSIS — K1379 Other lesions of oral mucosa: Secondary | ICD-10-CM | POA: Diagnosis not present

## 2020-07-11 DIAGNOSIS — K219 Gastro-esophageal reflux disease without esophagitis: Secondary | ICD-10-CM | POA: Diagnosis not present

## 2020-07-11 DIAGNOSIS — I672 Cerebral atherosclerosis: Secondary | ICD-10-CM | POA: Diagnosis not present

## 2020-07-11 DIAGNOSIS — I10 Essential (primary) hypertension: Secondary | ICD-10-CM | POA: Diagnosis not present

## 2020-07-11 DIAGNOSIS — H5347 Heteronymous bilateral field defects: Secondary | ICD-10-CM | POA: Diagnosis not present

## 2020-07-11 DIAGNOSIS — R29701 NIHSS score 1: Secondary | ICD-10-CM | POA: Diagnosis not present

## 2020-07-11 DIAGNOSIS — R29810 Facial weakness: Secondary | ICD-10-CM | POA: Diagnosis not present

## 2020-07-11 DIAGNOSIS — R531 Weakness: Secondary | ICD-10-CM | POA: Diagnosis not present

## 2020-07-11 DIAGNOSIS — I6622 Occlusion and stenosis of left posterior cerebral artery: Secondary | ICD-10-CM | POA: Diagnosis not present

## 2020-07-11 DIAGNOSIS — J9811 Atelectasis: Secondary | ICD-10-CM | POA: Diagnosis not present

## 2020-07-11 DIAGNOSIS — I63432 Cerebral infarction due to embolism of left posterior cerebral artery: Secondary | ICD-10-CM | POA: Diagnosis not present

## 2020-07-11 DIAGNOSIS — E785 Hyperlipidemia, unspecified: Secondary | ICD-10-CM | POA: Diagnosis not present

## 2020-07-11 DIAGNOSIS — R002 Palpitations: Secondary | ICD-10-CM | POA: Diagnosis not present

## 2020-07-11 DIAGNOSIS — K449 Diaphragmatic hernia without obstruction or gangrene: Secondary | ICD-10-CM | POA: Diagnosis not present

## 2020-07-11 DIAGNOSIS — H547 Unspecified visual loss: Secondary | ICD-10-CM | POA: Diagnosis not present

## 2020-07-11 DIAGNOSIS — J90 Pleural effusion, not elsewhere classified: Secondary | ICD-10-CM | POA: Diagnosis not present

## 2020-07-11 DIAGNOSIS — I63532 Cerebral infarction due to unspecified occlusion or stenosis of left posterior cerebral artery: Secondary | ICD-10-CM | POA: Diagnosis not present

## 2020-07-11 DIAGNOSIS — Z20822 Contact with and (suspected) exposure to covid-19: Secondary | ICD-10-CM | POA: Diagnosis not present

## 2020-07-11 DIAGNOSIS — I639 Cerebral infarction, unspecified: Secondary | ICD-10-CM | POA: Diagnosis not present

## 2020-07-11 DIAGNOSIS — R42 Dizziness and giddiness: Secondary | ICD-10-CM | POA: Diagnosis not present

## 2020-07-11 DIAGNOSIS — R410 Disorientation, unspecified: Secondary | ICD-10-CM | POA: Diagnosis not present

## 2020-07-11 DIAGNOSIS — G4489 Other headache syndrome: Secondary | ICD-10-CM | POA: Diagnosis not present

## 2020-07-11 DIAGNOSIS — Z87891 Personal history of nicotine dependence: Secondary | ICD-10-CM | POA: Diagnosis not present

## 2020-07-11 DIAGNOSIS — Z9282 Status post administration of tPA (rtPA) in a different facility within the last 24 hours prior to admission to current facility: Secondary | ICD-10-CM | POA: Diagnosis not present

## 2020-07-11 DIAGNOSIS — I6523 Occlusion and stenosis of bilateral carotid arteries: Secondary | ICD-10-CM | POA: Diagnosis not present

## 2020-07-13 DIAGNOSIS — I63432 Cerebral infarction due to embolism of left posterior cerebral artery: Secondary | ICD-10-CM | POA: Diagnosis not present

## 2020-07-15 ENCOUNTER — Ambulatory Visit (INDEPENDENT_AMBULATORY_CARE_PROVIDER_SITE_OTHER): Payer: PPO | Admitting: Family Medicine

## 2020-07-15 ENCOUNTER — Other Ambulatory Visit: Payer: Self-pay

## 2020-07-15 ENCOUNTER — Encounter: Payer: Self-pay | Admitting: Family Medicine

## 2020-07-15 VITALS — BP 122/68 | HR 68 | Temp 97.6°F | Resp 16 | Ht 61.0 in | Wt 194.6 lb

## 2020-07-15 DIAGNOSIS — I639 Cerebral infarction, unspecified: Secondary | ICD-10-CM | POA: Diagnosis not present

## 2020-07-15 NOTE — Progress Notes (Signed)
Established Patient Office Visit  Subjective:  Patient ID: Tonya Graves, female    DOB: May 25, 1951  Age: 69 y.o. MRN: [id number]  CC:  Chief Complaint  Patient presents with  . Hospitalization Follow-up    Stroke Friday-affected vision on right side-    HPI GRACI HULCE presents for hospital follow up-CVA-discharge on Monday-appt now 48 hours later for follow up Hyperlipidemia-pt was started on atorvastatin 80mg  daily-LDL 111   Palpitations-pt wearing a monitor for 2 weeks-palpitations the morning of the CVA. Pt took metoprolol on Monday after symptoms of palpitations. Concern for afib as cause of CVA with irregular heart beat. None noted on the monitor during hospitalization.  -pt taking asa /day  GERD-pt using pantoprazole 40mg ( previously on 20mg  omeprazole)  CVA with concern for right vision-peripheral vision loss -right eye. Immediately -loss of vision midline lateral. Improvement after TPA.  Past Medical History:  Diagnosis Date  . Anemia YEARS AGO  . Arthritis    OA  . Bronchitis   . 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 Cataracts, bilateral   . GERD (gastroesophageal reflux disease)   . Heart palpitations 2016 august  . High cholesterol   . History of hiatal hernia   . History of palpitations    NONE IN LAST FEW YEARS   . Osteopenia   . Prediabetes   . Urine frequency   . UTI (urinary tract infection)   . Varicose veins   . Ventricular tachycardia (paroxysmal) ([place]) 08/2019   30 day monitor.    Past Surgical History:  Procedure Laterality Date  .  C SECTIONS     X 2  . ORAL SURGERY FOR MOUTH CANCER  06-11-2015  . TENDON REPAIR Right  YEARS AGO   LITTLE FINGER  . TOTAL KNEE ARTHROPLASTY Right 07/13/2015   Procedure: RIGHT TOTAL KNEE ARTHROPLASTY;  Surgeon: Gaynelle Arabian, MD;  Location: [facility];  Service: Orthopedics;  Laterality: Right;  . TOTAL KNEE ARTHROPLASTY Left 06/06/2016   Procedure: LEFT TOTAL KNEE  ARTHROPLASTY;  Surgeon: Gaynelle Arabian, MD;  Location: [facility];  Service: Orthopedics;  Laterality: Left;    Family History  Problem Relation Age of Onset  . High blood pressure Mother   . Atrial fibrillation Mother   . Stroke Father   . Heart disease Father   . High Cholesterol Father   . Atrial fibrillation Sister   . Cancer Maternal Grandmother   . Leukemia Maternal Grandmother   . Other Maternal Grandfather        rocky [place] spotted fever  . Stroke Paternal Grandmother     Social History   Socioeconomic History  . Marital status: Married    Spouse name: Not on file  . Number of children: Not on file  . Years of education: Not on file  . Highest education level: Not on file  Occupational History  . Not on file  Tobacco Use  . Smoking status: Never Smoker  . Smokeless tobacco: Never Used  Substance and Sexual Activity  . Alcohol use: No  . Drug use: No  . Sexual activity: Not on file  Other Topics Concern  . Not on file  Social History Narrative  . Not on file   Social Determinants of Health   Financial Resource Strain:   . Difficulty of Paying Living Expenses: Not on file  Food Insecurity:   . Worried About Charity fundraiser in the Last Year:  Not on file  . Ran Out of Food in the Last Year: Not on file  Transportation Needs:   . Lack of Transportation (Medical): Not on file  . Lack of Transportation (Non-Medical): Not on file  Physical Activity:   . Days of Exercise per Week: Not on file  . Minutes of Exercise per Session: Not on file  Stress:   . Feeling of Stress : Not on file  Social Connections:   . Frequency of Communication with Friends and Family: Not on file  . Frequency of Social Gatherings with Friends and Family: Not on file  . Attends Religious Services: Not on file  . Active Member of Clubs or Organizations: Not on file  . Attends Archivist Meetings: Not on file  . Marital Status: Not on file  Intimate Partner Violence:   .  Fear of Current or Ex-Partner: Not on file  . Emotionally Abused: Not on file  . Physically Abused: Not on file  . Sexually Abused: Not on file    Outpatient Medications Prior to Visit  Medication Sig Dispense Refill  . aspirin 81 MG chewable tablet Chew by mouth.    Marland Kitchen atorvastatin (LIPITOR) 80 MG tablet Take by mouth.    . Calcium Carbonate-Vitamin D (CALCIUM 500 + D PO) Take 1 tablet by mouth 2 (two) times daily.    . cyclobenzaprine (FLEXERIL) 5 MG tablet Take 1 tablet (5 mg total) by mouth at bedtime. 30 tablet 1  . Hypromellose (ARTIFICIAL TEARS OP) Place 1 drop into both eyes daily as needed. Dry eyes    . metoprolol tartrate (LOPRESSOR) 25 MG tablet TAKE 1 TABLET (25 MG TOTAL) BY MOUTH AS NEEDED (FOR PALPITATIONS). 90 tablet 1  . pantoprazole (PROTONIX) 40 MG tablet Take by mouth.    . Vitamin D, Ergocalciferol, (DRISDOL) 1.25 MG (50000 UNIT) CAPS capsule TAKE 1 CAPSULE BY MOUTH ONE TIME PER WEEK 12 capsule 5  . ibuprofen (ADVIL,MOTRIN) 200 MG tablet Take 1 tablet by mouth as directed. (Patient not taking: Reported on 07/15/2020)    . omeprazole (PRILOSEC) 20 MG capsule Take 20 mg by mouth daily. (Patient not taking: Reported on 07/15/2020)    . Red Yeast Rice 600 MG TABS Take 1 tablet by mouth daily. (Patient not taking: Reported on 07/15/2020)     No facility-administered medications prior to visit.    No Known Allergies  ROS Review of Systems  Constitutional: Positive for fatigue.  HENT: Negative for trouble swallowing.   Eyes:       Loss of vision -slightly improved peripheral vision subsequently  Musculoskeletal: Negative for gait problem.  Neurological: Negative for tremors, speech difficulty, weakness, numbness and headaches.      Objective:    Physical Exam Constitutional:      Appearance: Normal appearance.  HENT:     Head: Normocephalic and atraumatic.  Cardiovascular:     Rate and Rhythm: Normal rate and regular rhythm.     Pulses: Normal pulses.      Heart sounds: Normal heart sounds.  Pulmonary:     Effort: Pulmonary effort is normal.     Breath sounds: Normal breath sounds.  Musculoskeletal:     Cervical back: Normal range of motion and neck supple.  Neurological:     Mental Status: She is alert and oriented to person, place, and time.     Motor: No weakness.     Gait: Gait normal.  Psychiatric:        Mood and  Affect: Mood normal.        Behavior: Behavior normal.     BP 122/68   Pulse 68   Temp 97.6 F (36.4 C)   Resp 16   Ht 5\' 1"  (1.549 m)   Wt 194 lb 9.6 oz (88.3 kg)   SpO2 100%   BMI 36.77 kg/m  Wt Readings from Last 3 Encounters:  07/15/20 194 lb 9.6 oz (88.3 kg)  06/09/20 200 lb (90.7 kg)  02/25/20 198 lb 6.4 oz (90 kg)     Health Maintenance Due  Topic Date Due  . Hepatitis C Screening  Never done    Lab Results  Component Value Date   TSH 2.860 02/27/2020   Lab Results  Component Value Date   WBC 4.6 02/27/2020   HGB 12.8 02/27/2020   HCT 37.7 02/27/2020   MCV 85 02/27/2020   PLT 243 02/27/2020   Lab Results  Component Value Date   NA 143 02/27/2020   K 4.4 02/27/2020   CO2 24 02/27/2020   GLUCOSE 98 02/27/2020   BUN 14 02/27/2020   CREATININE 0.83 02/27/2020   BILITOT 0.2 02/27/2020   ALKPHOS 69 02/27/2020   AST 22 02/27/2020   ALT 14 02/27/2020   PROT 6.6 02/27/2020   ALBUMIN 4.1 02/27/2020   CALCIUM 9.4 02/27/2020   ANIONGAP 5 06/08/2016   Lab Results  Component Value Date   CHOL 219 (H) 02/27/2020   Lab Results  Component Value Date   HDL 96 02/27/2020   Lab Results  Component Value Date   LDLCALC 112 (H) 02/27/2020   Lab Results  Component Value Date   TRIG 65 02/27/2020   Lab Results  Component Value Date   CHOLHDL 2.3 02/27/2020     Assessment & Plan:  1. Cerebrovascular accident (CVA), unspecified mechanism ([place]) Admission on Saturday to [facility].  Pt given TPA at [place] -transported when stable to [facility]. Discharged on Monday with follow up scheduled at  Neurology clinic at [facility] PT coming to home to assess for safety and ambulation - Ambulatory referral to Ophthalmology- peripheral vision loss - Ambulatory referral to Neurology-one time visit at [facility] neuro clinic-recommended local follow up Follow-up:  Follow up appt with [facility]-neurology clinic -[facility]iller for recheck in 3 months for labwork  Aalliyah Kilker Hannah Beat, MD

## 2020-07-15 NOTE — Patient Instructions (Signed)
Physical Therapy After a Stroke After a stroke, some people experience physical changes or problems. Physical therapy may be prescribed to help you recover and overcome problems such as:  Inability to move (paralysis) or weakness, typically affecting one side of the body.  Trouble with balance.  Pain, a pins and needles sensation, or numbness in certain parts of the body. You may also have difficulty feeling touch, pressure, or changes in temperature.  Involuntary muscle tightening (spasticity).  Stiffness in muscles and joints.  Altered coordination and reflexes. What causes physical disability after a stroke? A stroke can damage parts of your brain that control your body's normal functions, including your ability to move and to keep your balance. The types of physical problems you have will depend on how severe the stroke was and where it was located in the brain. Weakness or paralysis may affect just your fingers and hands, a whole leg or arm, or an entire side of your body. What is physical therapy? Physical therapy involves using exercises, stretches, and activities to help you regain movement and independence after your stroke. Physical therapy may focus on one or more of the following:  Range of motion. This can help with movement and reduce muscle stiffness.  Balance. This helps to lower your risk of falling.  Position changes or transfers, such as moving from sitting to standing or from a chair to a bed.  Coordination, such as getting an object from a shelf.  Muscle strength. Muscles may be strengthened with weights or by repeating certain motions.  Functional mobility. This may include stair training or learning how to use a wheelchair, walker, or cane.  Walking (gait training).  Activities of daily living, such as getting out of the car or buttoning a shirt. Why is physical therapy important? It is important to do exercises and follow your rehabilitation plan as told by  your physical therapist. Physical therapy can:  Help you regain independence.  Prevent injury from falls by building strength and balance.  Lower your risk of blood clots.  Lower your risk of skin sores (pressure injuries).  Increase physical activity and exercise. This may help lower your risk for another stroke.  Help reduce pain. When will therapy start and where will I have therapy? Your health care provider will decide when it is best for you to start therapy. In some cases, people start rehabilitation, including physical therapy, as soon as they are medically stable, which may be 24-48 hours after a stroke. Rehabilitation can take place in a few different places, based on your needs. It may take place in:  The hospital or an in-patient rehabilitation hospital.  An outpatient rehabilitation facility.  A long-term care facility.  A community rehabilitation clinic.  Your home. What are assistive devices? Assistive devices are tools to help you move, maintain balance, and manage daily tasks while recovering from a stroke. Your physical therapist may recommend and help you learn to use:  Equipment to help you move, such as wheelchairs, canes, or walkers.  Braces or splints to keep your arms, hands, legs, or feet in a comfortable and safe position.  Bathtub benches or grab bars to keep you safe in the bathroom.  Special utensils, bowls, and plates that allow you to eat with one hand. It is important to use these devices as told by your health care provider. Summary  After a stroke, some people may experience physical disabilities, such as weakness or paralysis, pain, or balance problems.  Physical therapy  involves exercises, stretches, and activities that help to improve your ability to move and to handle daily tasks.  Physical therapy exercises focus on restoring range of motion, balance, coordination, muscle strength, and the ability to move (mobility).  Physical  therapy can help you regain independence, prevent falls, and allow you to live a more active lifestyle after a stroke. This information is not intended to replace advice given to you by your health care provider. Make sure you discuss any questions you have with your health care provider. Document Revised: 12/13/2018 Document Reviewed: 11/28/2016 Elsevier Patient Education  [place].

## 2020-07-16 ENCOUNTER — Encounter: Payer: Self-pay | Admitting: Neurology

## 2020-07-16 DIAGNOSIS — Z9181 History of falling: Secondary | ICD-10-CM | POA: Diagnosis not present

## 2020-07-16 DIAGNOSIS — Z7982 Long term (current) use of aspirin: Secondary | ICD-10-CM | POA: Diagnosis not present

## 2020-07-16 DIAGNOSIS — K219 Gastro-esophageal reflux disease without esophagitis: Secondary | ICD-10-CM | POA: Diagnosis not present

## 2020-07-16 DIAGNOSIS — H5461 Unqualified visual loss, right eye, normal vision left eye: Secondary | ICD-10-CM | POA: Diagnosis not present

## 2020-07-16 DIAGNOSIS — Z87891 Personal history of nicotine dependence: Secondary | ICD-10-CM | POA: Diagnosis not present

## 2020-07-16 DIAGNOSIS — E785 Hyperlipidemia, unspecified: Secondary | ICD-10-CM | POA: Diagnosis not present

## 2020-07-16 DIAGNOSIS — I10 Essential (primary) hypertension: Secondary | ICD-10-CM | POA: Diagnosis not present

## 2020-07-16 DIAGNOSIS — I69398 Other sequelae of cerebral infarction: Secondary | ICD-10-CM | POA: Diagnosis not present

## 2020-07-23 DIAGNOSIS — N950 Postmenopausal bleeding: Secondary | ICD-10-CM | POA: Diagnosis not present

## 2020-07-23 DIAGNOSIS — Z6835 Body mass index (BMI) 35.0-35.9, adult: Secondary | ICD-10-CM | POA: Diagnosis not present

## 2020-07-23 DIAGNOSIS — Z124 Encounter for screening for malignant neoplasm of cervix: Secondary | ICD-10-CM | POA: Diagnosis not present

## 2020-07-23 DIAGNOSIS — Z01419 Encounter for gynecological examination (general) (routine) without abnormal findings: Secondary | ICD-10-CM | POA: Diagnosis not present

## 2020-07-28 DIAGNOSIS — Z7982 Long term (current) use of aspirin: Secondary | ICD-10-CM

## 2020-07-28 DIAGNOSIS — Z9181 History of falling: Secondary | ICD-10-CM

## 2020-07-28 DIAGNOSIS — I10 Essential (primary) hypertension: Secondary | ICD-10-CM | POA: Diagnosis not present

## 2020-07-28 DIAGNOSIS — K219 Gastro-esophageal reflux disease without esophagitis: Secondary | ICD-10-CM | POA: Diagnosis not present

## 2020-07-28 DIAGNOSIS — I69398 Other sequelae of cerebral infarction: Secondary | ICD-10-CM | POA: Diagnosis not present

## 2020-07-28 DIAGNOSIS — E785 Hyperlipidemia, unspecified: Secondary | ICD-10-CM

## 2020-07-28 DIAGNOSIS — H5461 Unqualified visual loss, right eye, normal vision left eye: Secondary | ICD-10-CM | POA: Diagnosis not present

## 2020-07-28 DIAGNOSIS — Z87891 Personal history of nicotine dependence: Secondary | ICD-10-CM

## 2020-08-05 DIAGNOSIS — I482 Chronic atrial fibrillation, unspecified: Secondary | ICD-10-CM | POA: Diagnosis not present

## 2020-08-10 ENCOUNTER — Other Ambulatory Visit: Payer: Self-pay

## 2020-08-10 MED ORDER — PANTOPRAZOLE SODIUM 40 MG PO TBEC: 40.0000 mg | DELAYED_RELEASE_TABLET | Freq: Every day | ORAL | 2 refills | Status: DC

## 2020-08-10 MED ORDER — ATORVASTATIN CALCIUM 80 MG PO TABS: 80.0000 mg | ORAL_TABLET | Freq: Every day | ORAL | 2 refills | Status: DC

## 2020-08-11 DIAGNOSIS — I10 Essential (primary) hypertension: Secondary | ICD-10-CM | POA: Diagnosis not present

## 2020-08-11 DIAGNOSIS — K219 Gastro-esophageal reflux disease without esophagitis: Secondary | ICD-10-CM | POA: Diagnosis not present

## 2020-08-11 DIAGNOSIS — H5461 Unqualified visual loss, right eye, normal vision left eye: Secondary | ICD-10-CM | POA: Diagnosis not present

## 2020-08-11 DIAGNOSIS — I69398 Other sequelae of cerebral infarction: Secondary | ICD-10-CM | POA: Diagnosis not present

## 2020-08-11 DIAGNOSIS — Z7982 Long term (current) use of aspirin: Secondary | ICD-10-CM | POA: Diagnosis not present

## 2020-08-11 DIAGNOSIS — E785 Hyperlipidemia, unspecified: Secondary | ICD-10-CM | POA: Diagnosis not present

## 2020-08-11 DIAGNOSIS — Z9181 History of falling: Secondary | ICD-10-CM | POA: Diagnosis not present

## 2020-08-11 DIAGNOSIS — Z87891 Personal history of nicotine dependence: Secondary | ICD-10-CM | POA: Diagnosis not present

## 2020-08-16 NOTE — Progress Notes (Signed)
NEUROLOGY CONSULTATION NOTE  NAVJOT PILGRIM MRN: [id number] DOB: 01-Sep-1951  Referring provider: Benny Lennert, MD Primary care provider: [name], PA-C  Reason for consult:  Stroke   Subjective:  Tonya Graves. Lean is a 69 year old right-handed female with HLD and prior smoker who presents for stroke.  History supplemented by hospital records.  On 07/11/2020, she developed acute onset of dizziness for a couple of minutes and then noted right visual field cut.  She has known paroxysmal sinus  For which she takes metoprolol as needed.  For several hours prior to this, she noted palpitations which felt more severe and did not respond as well to the metoprolol.  She was taken to [facility] where she received IV tPA.  CTA head and neck showed ischemic stroke in the left P2 territory as well as atheromatous changes in the carotid bifurcation but no significant intracranial or extracranial stenosis or large vessel occlusion.  She was transferred to [facility] for further care.  Echocardiogram showed EF >55% with mild mitral calcification but no cardiac source of embolus.  Hgb A1c was 5.6.  LDL was 111.  She was also started on ASA 81mg  and atorvastatin 80mg  daily.  Telemetry showed no significant arrhythmia but given history of palpitations, a fib was a concern.  She was started on a ZIO patch on discharge which subsequently demonstrated symptomatic brief episodes of paroxysmal atrial tachycardia but no atrial fibrillation.  She is currently not driving.  She reports vision improved but sometimes still has some trouble seeing faces.  She has not been driving.     PAST MEDICAL HISTORY: Past Medical History:  Diagnosis Date  . Anemia YEARS AGO  . Arthritis    OA  . Bronchitis   . 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 Cataracts, bilateral   . GERD (gastroesophageal reflux disease)   . Heart palpitations 2016 august  . High cholesterol   . History  of hiatal hernia   . History of palpitations    NONE IN LAST FEW YEARS   . Osteopenia   . Prediabetes   . Urine frequency   . UTI (urinary tract infection)   . Varicose veins   . Ventricular tachycardia (paroxysmal) ([place]) 08/2019   30 day monitor.    PAST SURGICAL HISTORY: Past Surgical History:  Procedure Laterality Date  .  C SECTIONS     X 2  . ORAL SURGERY FOR MOUTH CANCER  06-11-2015  . TENDON REPAIR Right  YEARS AGO   LITTLE FINGER  . TOTAL KNEE ARTHROPLASTY Right 07/13/2015   Procedure: RIGHT TOTAL KNEE ARTHROPLASTY;  Surgeon: Gaynelle Arabian, MD;  Location: [facility];  Service: Orthopedics;  Laterality: Right;  . TOTAL KNEE ARTHROPLASTY Left 06/06/2016   Procedure: LEFT TOTAL KNEE ARTHROPLASTY;  Surgeon: Gaynelle Arabian, MD;  Location: [facility];  Service: Orthopedics;  Laterality: Left;    MEDICATIONS: Current Outpatient Medications on File Prior to Visit  Medication Sig Dispense Refill  . atorvastatin (LIPITOR) 80 MG tablet Take 1 tablet (80 mg total) by mouth daily. 30 tablet 2  . Calcium Carbonate-Vitamin D (CALCIUM 500 + D PO) Take 1 tablet by mouth 2 (two) times daily.    . cyclobenzaprine (FLEXERIL) 5 MG tablet Take 1 tablet (5 mg total) by mouth at bedtime. 30 tablet 1  . Hypromellose (ARTIFICIAL TEARS OP) Place 1 drop into both eyes daily as needed. Dry eyes    .  ibuprofen (ADVIL,MOTRIN) 200 MG tablet Take 1 tablet by mouth as directed. (Patient not taking: Reported on 07/15/2020)    . metoprolol tartrate (LOPRESSOR) 25 MG tablet TAKE 1 TABLET (25 MG TOTAL) BY MOUTH AS NEEDED (FOR PALPITATIONS). 90 tablet 1  . omeprazole (PRILOSEC) 20 MG capsule Take 20 mg by mouth daily. (Patient not taking: Reported on 07/15/2020)    . pantoprazole (PROTONIX) 40 MG tablet Take 1 tablet (40 mg total) by mouth daily. 30 tablet 2  . Red Yeast Rice 600 MG TABS Take 1 tablet by mouth daily. (Patient not taking: Reported on 07/15/2020)    . Vitamin D, Ergocalciferol, (DRISDOL) 1.25 MG (50000  UNIT) CAPS capsule TAKE 1 CAPSULE BY MOUTH ONE TIME PER WEEK 12 capsule 5   No current facility-administered medications on file prior to visit.    ALLERGIES: No Known Allergies  FAMILY HISTORY: Family History  Problem Relation Age of Onset  . High blood pressure Mother   . Atrial fibrillation Mother   . Stroke Father   . Heart disease Father   . High Cholesterol Father   . Atrial fibrillation Sister   . Cancer Maternal Grandmother   . Leukemia Maternal Grandmother   . Other Maternal Grandfather        rocky [place] spotted fever  . Stroke Paternal Grandmother     SOCIAL HISTORY: Social History   Socioeconomic History  . Marital status: Married    Spouse name: Not on file  . Number of children: Not on file  . Years of education: Not on file  . Highest education level: Not on file  Occupational History  . Not on file  Tobacco Use  . Smoking status: Never Smoker  . Smokeless tobacco: Never Used  Substance and Sexual Activity  . Alcohol use: No  . Drug use: No  . Sexual activity: Not on file  Other Topics Concern  . Not on file  Social History Narrative  . Not on file   Social Determinants of Health   Financial Resource Strain: Not on file  Food Insecurity: Not on file  Transportation Needs: Not on file  Physical Activity: Not on file  Stress: Not on file  Social Connections: Not on file  Intimate Partner Violence: Not on file    Objective:  Blood pressure (!) 152/83, pulse 78, height 5\' 1"  (1.549 m), weight 198 lb 3.2 oz (89.9 kg), SpO2 99 %. General: No acute distress.  Patient appears well-groomed.   Head:  Normocephalic/atraumatic Eyes:  fundi examined but not visualized Neck: supple, no paraspinal tenderness, full range of motion Back: No paraspinal tenderness Heart: regular rate and rhythm Lungs: Clear to auscultation bilaterally. Vascular: No carotid bruits. Neurological Exam: Mental status: alert and oriented to person, place, and time, recent  and remote memory intact, fund of knowledge intact, attention and concentration intact, speech fluent and not dysarthric, language intact. Cranial nerves: CN I: not tested CN II: pupils equal, round and reactive to light, visual fields intact CN III, IV, VI:  full range of motion, no nystagmus, no ptosis CN V: facial sensation intact. CN VII: upper and lower face symmetric CN VIII: hearing intact CN IX, X: gag intact, uvula midline CN XI: sternocleidomastoid and trapezius muscles intact CN XII: tongue midline Bulk & Tone: normal, no fasciculations. Motor:  muscle strength 5/5 throughout Sensation:  Pinprick, temperature and vibratory sensation intact. Deep Tendon Reflexes:  2+ throughout,  toes downgoing.   Finger to nose testing:  Without dysmetria.  Heel to shin:  Without dysmetria.   Gait:  Normal station and stride.  Romberg negative.  Assessment/Plan:   1.  [place] PCA territory infarct, likely embolic of unknown source 2.  Palpitations.  She has known history of sinus tachycardia.  However, given that this episode felt more severe and lasted several hours prior to a stroke, must consider paroxysmal atrial fibrillation. 3.  Hypertension 4.  Hyperlipidemia  1.  Secondary stroke prevention as per PCP: -  Continue ASA 81mg  daily for secondary stroke prevention -  Atorvastatin 80mg  daily.  LDL goal should be less than 70. -  Optimize blood pressure control.  Advised to recheck blood pressure at home. If still elevated, then advised to follow up with PCP -  Glycemic control.  Hgb A1c goal less than 7 2.  I would recommend following up with her cardiologist, Dr. Gwenlyn Found, to discuss possibility of implantable loop recorder 3.  Follow up with ophthalmology.  I don't note any gross homonymous hemianopsia but should not drive unless cleared by ophthalmology. 4.  Mediterranean diet 5.  Follow up in 6 months.   Thank you for allowing me to take part in the care of this patient.  Metta Clines, DO  CC:  [name], PA-C  Benny Lennert, MD  Quay Burow, MD

## 2020-08-17 ENCOUNTER — Other Ambulatory Visit: Payer: Self-pay

## 2020-08-17 ENCOUNTER — Encounter: Payer: Self-pay | Admitting: Neurology

## 2020-08-17 ENCOUNTER — Ambulatory Visit: Payer: PPO | Admitting: Neurology

## 2020-08-17 VITALS — BP 152/83 | HR 78 | Ht 61.0 in | Wt 198.2 lb

## 2020-08-17 DIAGNOSIS — I63432 Cerebral infarction due to embolism of left posterior cerebral artery: Secondary | ICD-10-CM

## 2020-08-17 DIAGNOSIS — I159 Secondary hypertension, unspecified: Secondary | ICD-10-CM

## 2020-08-17 DIAGNOSIS — R002 Palpitations: Secondary | ICD-10-CM | POA: Diagnosis not present

## 2020-08-17 DIAGNOSIS — E785 Hyperlipidemia, unspecified: Secondary | ICD-10-CM

## 2020-08-17 NOTE — Patient Instructions (Signed)
1.  Continue aspirin 81mg  daily 2.  Continue atorvastatin 80mg  daily 3.  Follow up on blood pressure.  If still elevated, contact your PCP 4.  Consider discussing with Dr. Gwenlyn Found about an implantable loop recorder 5.  Mediterranean diet (see below) 6.  Follow up in 6 months   Mediterranean Diet A Mediterranean diet refers to food and lifestyle choices that are based on the traditions of countries located on the [employer]. This way of eating has been shown to help prevent certain conditions and improve outcomes for people who have chronic diseases, like kidney disease and heart disease. What are tips for following this plan? Lifestyle  Cook and eat meals together with your family, when possible.  Drink enough fluid to keep your urine clear or pale yellow.  Be physically active every day. This includes: ? Aerobic exercise like running or swimming. ? Leisure activities like gardening, walking, or housework.  Get 7-8 hours of sleep each night.  If recommended by your health care provider, drink red wine in moderation. This means 1 glass a day for nonpregnant women and 2 glasses a day for men. A glass of wine equals 5 oz (150 mL). Reading food labels   Check the serving size of packaged foods. For foods such as rice and pasta, the serving size refers to the amount of cooked product, not dry.  Check the total fat in packaged foods. Avoid foods that have saturated fat or trans fats.  Check the ingredients list for added sugars, such as corn syrup. Shopping  At the grocery store, buy most of your food from the areas near the walls of the store. This includes: ? Fresh fruits and vegetables (produce). ? Grains, beans, nuts, and seeds. Some of these may be available in unpackaged forms or large amounts (in bulk). ? Fresh seafood. ? Poultry and eggs. ? Low-fat dairy products.  Buy whole ingredients instead of prepackaged foods.  Buy fresh fruits and vegetables in-season from  local farmers markets.  Buy frozen fruits and vegetables in resealable bags.  If you do not have access to quality fresh seafood, buy precooked frozen shrimp or canned fish, such as tuna, salmon, or sardines.  Buy small amounts of raw or cooked vegetables, salads, or olives from the deli or salad bar at your store.  Stock your pantry so you always have certain foods on hand, such as olive oil, canned tuna, canned tomatoes, rice, pasta, and beans. Cooking  Cook foods with extra-virgin olive oil instead of using butter or other vegetable oils.  Have meat as a side dish, and have vegetables or grains as your main dish. This means having meat in small portions or adding small amounts of meat to foods like pasta or stew.  Use beans or vegetables instead of meat in common dishes like chili or lasagna.  Experiment with different cooking methods. Try roasting or broiling vegetables instead of steaming or sauteing them.  Add frozen vegetables to soups, stews, pasta, or rice.  Add nuts or seeds for added healthy fat at each meal. You can add these to yogurt, salads, or vegetable dishes.  Marinate fish or vegetables using olive oil, lemon juice, garlic, and fresh herbs. Meal planning   Plan to eat 1 vegetarian meal one day each week. Try to work up to 2 vegetarian meals, if possible.  Eat seafood 2 or more times a week.  Have healthy snacks readily available, such as: ? Vegetable sticks with hummus. ? [place] yogurt. ?  Fruit and nut trail mix.  Eat balanced meals throughout the week. This includes: ? Fruit: 2-3 servings a day ? Vegetables: 4-5 servings a day ? Low-fat dairy: 2 servings a day ? Fish, poultry, or lean meat: 1 serving a day ? Beans and legumes: 2 or more servings a week ? Nuts and seeds: 1-2 servings a day ? Whole grains: 6-8 servings a day ? Extra-virgin olive oil: 3-4 servings a day  Limit red meat and sweets to only a few servings a month What are my food  choices?  Mediterranean diet ? Recommended  Grains: Whole-grain pasta. Brown rice. Bulgar wheat. Polenta. Couscous. Whole-wheat bread. [place].  Vegetables: Artichokes. Beets. Broccoli. Cabbage. Carrots. Eggplant. Green beans. Chard. Kale. Spinach. Onions. Leeks. Peas. Squash. Tomatoes. Peppers. Radishes.  Fruits: Apples. Apricots. Avocado. Berries. Bananas. Cherries. Dates. Figs. Grapes. Lemons. Melon. Oranges. Peaches. Plums. Pomegranate.  Meats and other protein foods: Beans. Almonds. Sunflower seeds. Pine nuts. Peanuts. [place]. Salmon. Scallops. Shrimp. [place]. Tilapia. Clams. Oysters. Eggs.  Dairy: Low-fat milk. Cheese. Greek yogurt.  Beverages: Water. Red wine. Herbal tea.  Fats and oils: Extra virgin olive oil. Avocado oil. Grape seed oil.  Sweets and desserts: [place] yogurt with honey. Baked apples. Poached pears. Trail mix.  Seasoning and other foods: Basil. Cilantro. Coriander. Cumin. Mint. Parsley. Sage. Rosemary. Tarragon. Garlic. Oregano. Thyme. Pepper. Balsalmic vinegar. Tahini. Hummus. Tomato sauce. Olives. Mushrooms. ? Limit these  Grains: Prepackaged pasta or rice dishes. Prepackaged cereal with added sugar.  Vegetables: Deep fried potatoes (french fries).  Fruits: Fruit canned in syrup.  Meats and other protein foods: Beef. Pork. Lamb. Poultry with skin. Hot dogs. [place] Salines.  Dairy: Ice cream. Sour cream. Whole milk.  Beverages: Juice. Sugar-sweetened soft drinks. Beer. Liquor and spirits.  Fats and oils: Butter. Canola oil. Vegetable oil. Beef fat (tallow). Lard.  Sweets and desserts: Cookies. Cakes. Pies. Candy.  Seasoning and other foods: Mayonnaise. Premade sauces and marinades. The items listed may not be a complete list. Talk with your dietitian about what dietary choices are right for you. Summary  The Mediterranean diet includes both food and lifestyle choices.  Eat a variety of fresh fruits and vegetables, beans, nuts, seeds, and whole  grains.  Limit the amount of red meat and sweets that you eat.  Talk with your health care provider about whether it is safe for you to drink red wine in moderation. This means 1 glass a day for nonpregnant women and 2 glasses a day for men. A glass of wine equals 5 oz (150 mL). This information is not intended to replace advice given to you by your health care provider. Make sure you discuss any questions you have with your health care provider. Document Revised: 04/21/2016 Document Reviewed: 04/14/2016 Elsevier Patient Education  [place].

## 2020-09-02 ENCOUNTER — Other Ambulatory Visit: Payer: Self-pay | Admitting: Physician Assistant

## 2020-09-09 DIAGNOSIS — H53461 Homonymous bilateral field defects, right side: Secondary | ICD-10-CM | POA: Diagnosis not present

## 2020-09-09 DIAGNOSIS — H2513 Age-related nuclear cataract, bilateral: Secondary | ICD-10-CM | POA: Diagnosis not present

## 2020-09-15 ENCOUNTER — Ambulatory Visit (INDEPENDENT_AMBULATORY_CARE_PROVIDER_SITE_OTHER): Payer: PPO

## 2020-09-15 ENCOUNTER — Other Ambulatory Visit: Payer: Self-pay

## 2020-09-15 DIAGNOSIS — Z23 Encounter for immunization: Secondary | ICD-10-CM

## 2020-09-15 NOTE — Progress Notes (Signed)
   Covid-19 Vaccination Clinic  Name:  SERENITIE VINTON    MRN: [id number] DOB: 29-Jul-1951  09/15/2020  Ms. Bouchillon was observed post Covid-19 immunization for 15 minutes without incident. She was provided with Vaccine Information Sheet and instruction to access the V-Safe system.   Ms. Hutmacher was instructed to call 911 with any severe reactions post vaccine: Marland Kitchen Difficulty breathing  . Swelling of face and throat  . A fast heartbeat  . A bad rash all over body  . Dizziness and weakness   Immunizations Administered    Name Date Dose VIS Date Route   Moderna Covid-19 Booster Vaccine 09/15/2020 10:45 AM 0.25 mL 06/24/2020 Intramuscular   Manufacturer: Moderna   Lot: [id number]   [place]: [id number]

## 2020-10-13 ENCOUNTER — Other Ambulatory Visit: Payer: Self-pay

## 2020-10-13 ENCOUNTER — Ambulatory Visit: Payer: PPO | Admitting: Cardiovascular Disease

## 2020-10-13 ENCOUNTER — Encounter: Payer: Self-pay | Admitting: Cardiovascular Disease

## 2020-10-13 VITALS — BP 135/77 | HR 78 | Ht 61.0 in | Wt 194.6 lb

## 2020-10-13 DIAGNOSIS — I639 Cerebral infarction, unspecified: Secondary | ICD-10-CM | POA: Diagnosis not present

## 2020-10-13 DIAGNOSIS — R002 Palpitations: Secondary | ICD-10-CM

## 2020-10-13 DIAGNOSIS — I472 Ventricular tachycardia: Secondary | ICD-10-CM | POA: Diagnosis not present

## 2020-10-13 DIAGNOSIS — I4729 Other ventricular tachycardia: Secondary | ICD-10-CM

## 2020-10-13 NOTE — Assessment & Plan Note (Signed)
History of palpitations in the past with event monitor that showed PACs and short atrial runs.  These have become much less frequent over the last several months.

## 2020-10-13 NOTE — Progress Notes (Signed)
10/13/2020 JORDANN GRIME   04/10/1951  [id number]  Primary Physician [name], PA-C Primary Cardiologist: [name] MD [name], [place]  HPI:  Tonya Graves is a 70 y.o.  mildly overweight married Caucasian female mother 2, grandmother and 3 grandchildren referred to me by Dr. Tobie Poet for cardiology evaluation because of palpitations.I last saw her in the office  06/09/2020.  She has seen Dr. Bettina Gavia remotely for similar symptoms that occur during stressful times in her life and was prescribed when necessary metoprolol. She has no cardiac risk factors. She's never had a heart attack or stroke. She has occasional atypical chest pain with does not sound cardiac. Apparently her mother-in-law moved in with them towards the end of December2018 which caused a lot of stress at that time. Unfortunately, she has since passed away 10-Dec-2022 of last year and her mother has subsequent passed away as well. I did perform an event monitor which showed sinus rhythm with PACs and short atrial runs and a 2D echo that was entirely normal.  Since I saw her in the office 4 months ago she did have a stroke on 07/10/2020 and was treated at [facility].  Work-up was unrevealing including 2D echo in 2-week event monitor.  She did have some palpitations in January.  She denies chest pain or shortness of breath.   Current Meds  Medication Sig  . aspirin 81 MG EC tablet Take 81 mg by mouth daily.  . Calcium Carbonate-Vitamin D (CALCIUM 500 + D PO) Take 1 tablet by mouth 2 (two) times daily.  . cyclobenzaprine (FLEXERIL) 5 MG tablet Take 1 tablet (5 mg total) by mouth at bedtime.  Marland Kitchen omeprazole (PRILOSEC) 20 MG capsule Take 20 mg by mouth daily.  . Vitamin D, Ergocalciferol, (DRISDOL) 1.25 MG (50000 UNIT) CAPS capsule TAKE 1 CAPSULE BY MOUTH ONE TIME PER WEEK     No Known Allergies  Social History   Socioeconomic History  . Marital status: Married    Spouse name: Not on file  . Number of  children: Not on file  . Years of education: Not on file  . Highest education level: Not on file  Occupational History  . Not on file  Tobacco Use  . Smoking status: Never Smoker  . Smokeless tobacco: Never Used  Vaping Use  . Vaping Use: Never used  Substance and Sexual Activity  . Alcohol use: No  . Drug use: No  . Sexual activity: Not on file  Other Topics Concern  . Not on file  Social History Narrative  . Not on file   Social Determinants of Health   Financial Resource Strain: Not on file  Food Insecurity: Not on file  Transportation Needs: Not on file  Physical Activity: Not on file  Stress: Not on file  Social Connections: Not on file  Intimate Partner Violence: Not on file     Review of Systems: General: negative for chills, fever, night sweats or weight changes.  Cardiovascular: negative for chest pain, dyspnea on exertion, edema, orthopnea, palpitations, paroxysmal nocturnal dyspnea or shortness of breath Dermatological: negative for rash Respiratory: negative for cough or wheezing Urologic: negative for hematuria Abdominal: negative for nausea, vomiting, diarrhea, bright red blood per rectum, melena, or hematemesis Neurologic: negative for visual changes, syncope, or dizziness All other systems reviewed and are otherwise negative except as noted above.    Blood pressure 135/77, pulse 78, height 5\' 1"  (1.549 m), weight 194 lb  9.6 oz (88.3 kg), SpO2 98 %.  General appearance: alert and no distress Neck: no adenopathy, no carotid bruit, no JVD, supple, symmetrical, trachea midline and thyroid not enlarged, symmetric, no tenderness/mass/nodules Lungs: clear to auscultation bilaterally Heart: regular rate and rhythm, S1, S2 normal, no murmur, click, rub or gallop Extremities: extremities normal, atraumatic, no cyanosis or edema Pulses: 2+ and symmetric Skin: Skin color, texture, turgor normal. No rashes or lesions Neurologic: Alert and oriented X 3, normal  strength and tone. Normal symmetric reflexes. Normal coordination and gait  EKG not performed today  ASSESSMENT AND PLAN:   Palpitations History of palpitations in the past with event monitor that showed PACs and short atrial runs.  These have become much less frequent over the last several months.  Cerebrovascular accident (CVA) [facility]) Recent stroke 07/10/2020 seen in [facility] and transferred to [facility].  Apparently 2D echo was normal and a 2-week monitor did not reveal any significant arrhythmias.  She was placed on aspirin.  I believe she should have a loop recorder implanted to rule out A. fib as an etiology which would change her pharmacologic therapy.      [name] MD FACP,FACC,FAHA, [facility] 10/13/2020 10:07 AM

## 2020-10-13 NOTE — Assessment & Plan Note (Signed)
Recent stroke 07/10/2020 seen in [facility] and transferred to [facility].  Apparently 2D echo was normal and a 2-week monitor did not reveal any significant arrhythmias.  She was placed on aspirin.  I believe she should have a loop recorder implanted to rule out A. fib as an etiology which would change her pharmacologic therapy.

## 2020-10-13 NOTE — Patient Instructions (Addendum)
Medication Instructions:  Your physician recommends that you continue on your current medications as directed. Please refer to the Current Medication list given to you today.  *If you need a refill on your cardiac medications before your next appointment, please call your pharmacy*  Lab Work: Your physician recommends that you have labs drawn today: lipid/liver profile.   If you have labs (blood work) drawn today and your tests are completely normal, you will receive your results only by: Marland Kitchen MyChart Message (if you have MyChart) OR . A paper copy in the mail If you have any lab test that is abnormal or we need to change your treatment, we will call you to review the results.    Follow-Up: At [facility], you and your health needs are our priority.  As part of our continuing mission to provide you with exceptional heart care, we have created designated Provider Care Teams.  These Care Teams include your primary Cardiologist (physician) and Advanced Practice Providers (APPs -  Physician Assistants and Nurse Practitioners) who all work together to provide you with the care you need, when you need it.  We recommend signing up for the patient portal called "MyChart".  Sign up information is provided on this After Visit Summary.  MyChart is used to connect with patients for Virtual Visits (Telemedicine).  Patients are able to view lab/test results, encounter notes, upcoming appointments, etc.  Non-urgent messages can be sent to your provider as well.   To learn more about what you can do with MyChart, go to [email or web address].     Your next appointment:   6 month(s)  The format for your next appointment:   In Person  Provider:   You will see one of the following Advanced Practice Providers on your designated Care Team:    Kerin Ransom, PA-C  [place], [place]  Coletta Memos, [place]  Then, Quay Burow, MD will plan to see you again in 12 month(s).   Other  Instructions Referral to Dr. Sallyanne Kuster for loop recorder implantation.

## 2020-10-14 ENCOUNTER — Telehealth: Payer: Self-pay | Admitting: *Deleted

## 2020-10-14 LAB — HEPATIC FUNCTION PANEL
ALT: 28 IU/L (ref 0–32)
AST: 32 IU/L (ref 0–40)
Albumin: 4.1 g/dL (ref 3.8–4.8)
Alkaline Phosphatase: 90 IU/L (ref 44–121)
Bilirubin Total: 0.2 mg/dL (ref 0.0–1.2)
Bilirubin, Direct: <0.1 mg/dL (ref 0.00–0.40)
Total Protein: 6.8 g/dL (ref 6.0–8.5)

## 2020-10-14 LAB — LIPID PANEL
Chol/HDL Ratio: 1.9 ratio (ref 0.0–4.4)
Cholesterol, Total: 184 mg/dL (ref 100–199)
HDL: 99 mg/dL (ref >39)
LDL Chol Calc (NIH): 72 mg/dL (ref 0–99)
Triglycerides: 69 mg/dL (ref 0–149)
VLDL Cholesterol Cal: 13 mg/dL (ref 5–40)

## 2020-10-14 NOTE — Telephone Encounter (Signed)
The patient has been called and scheduled for a loop recorder implant on Monday 10/19/20    [facility] [place], [address]  [place], [place] [address]  Phone: [phone] Fax: [phone]   You are scheduled for a Loop Recorder Implant on 10/19/20  at 75: 40 am with Dr. Sallyanne Kuster. This will take place at [address], [address].    Please arrive at your appointment 15-20 minutes early.    You do not need to be fasting.    The procedure is performed with local anesthesia. You will not receive sedatives nor will an IV be placed.    Wash your chest and neck with the surgical soap the evening before and the morning of your procedure. Please following the washing instructions provided.    As with all surgical implants, there is a small risk of infection. If an infection occurs, the device will be removed. To help reduce the risk, please use the surgical scrub provided. Additional antiseptic precautions will be taken at the time of the procedure.    Please bring your insurance cards.   *Please note that scheduled loop recorder implants may need to be rescheduled if Dr. Sallyanne Kuster has a procedure urgently added on at the hospital   Preparing for the Procedure  Before the procedure, you can play an important role. Because skin is not sterile, your skin needs to be as free of germs as possible. You can reduce the number of germs on your skin by washing with CHG (chlorhexidine gluconate) Soap before the procedure. CHG is an antiseptic cleaner which kills germs and bonds with the skin to continue killing germs even after washing.  Please do not use if you have an allergy to CHG or antibacterial soaps. If your skin becomes reddened/irritated, STOP using the CHG.  DO NOT SHAVE (including legs and underarms) for at least 48 hours prior to first CHG shower. It is OK to shave your face.  Please follow these instructions carefully: 1. Shower the  night before the procedure and the morning of with CHG Soap. 2. If you chose to wash your hair, wash your hair first as usual with your normal shampoo/conditioner. 3. After you shampoo/condition, rinse you hair and body thoroughly to remove shampoo/conditioner. 4. Use CHG as you would any other liquid soap. You can apply CHG directly to the skin and wash gently with a loofah or a clean washcloth. 5. Apply the CHG Soap to your body ONLY FROM THE NECK DOWN. Do not use on open wounds or open sores. Avoid contact with your eyes, ears, mouth, and genitals (private parts).  6. Wash thoroughly, paying special attention to the area where your surgery will be performed. 7. Thoroughly rinse your body with warm water from the neck down. 8. DO NOT shower/wash with your normal soap after using and rinsing off the CHG Soap. 9. Pat yourself dry with a clean towel. 10. Wear clean pajamas to bed. 11. Place clean sheets on your bed the night of your first shower and do not sleep with pets..  Day of Surgery: Shower with the CHG Soap following the instructions listed above. DO NOT apply deodorants or lotions.

## 2020-10-15 ENCOUNTER — Ambulatory Visit: Payer: PPO | Admitting: Physician Assistant

## 2020-10-19 ENCOUNTER — Other Ambulatory Visit: Payer: Self-pay

## 2020-10-19 ENCOUNTER — Ambulatory Visit: Payer: PPO | Admitting: Cardiovascular Disease

## 2020-10-19 ENCOUNTER — Ambulatory Visit: Payer: PPO | Admitting: Physician Assistant

## 2020-10-19 DIAGNOSIS — Z95818 Presence of other cardiac implants and grafts: Secondary | ICD-10-CM | POA: Diagnosis not present

## 2020-10-19 DIAGNOSIS — I639 Cerebral infarction, unspecified: Secondary | ICD-10-CM | POA: Diagnosis not present

## 2020-10-19 MED ORDER — LIDOCAINE-EPINEPHRINE 1 %-1:100000 IJ SOLN: 10.0000 mL | Freq: Once | INTRAMUSCULAR | Status: DC

## 2020-10-19 NOTE — Patient Instructions (Signed)
Discharge Instructions for  Loop Recorder Implant    Follow up: Keep your wound check appointment on 11/04/20 at 10:20 am.  If you have any questions or concerns, please call the office at [phone].  ACTIVITY No restrictions. DO wear your seatbelt, even if it crosses over the site.   WOUND CARE  Keep the wound area clean and dry.  Remove the dressing the day after (usually 24 hours after the procedure).  DO NOT SUBMERGE UNDER WATER UNTIL FULLY HEALED (no tub baths, hot tubs, swimming pools, etc.).   You  may shower or take a sponge bath after the dressing is removed. DO NOT SOAK the area and do not allow the shower to directly spray on the site.  If you have tape/steri-strips on your wound, these will fall off; do not pull them off prematurely.    No bandage is needed on the site.  DO  NOT apply any creams, oils, or ointments to the wound area.  If you notice any drainage or discharge from the wound, any swelling, excessive redness or bruising at the site, or if you develop a fever > 101? F, call the office at once at [phone].

## 2020-10-19 NOTE — Progress Notes (Signed)
LOOP RECORDER IMPLANT   Procedure report  Procedure performed:  Loop recorder implantation   Reason for procedure:  1. Cryptogenic stroke 2. Palpitations Procedure performed by:  Sanda Klein, MD  Complications:  None  Estimated blood loss:  <5 mL  Medications administered during procedure:  Lidocaine 1% with 1/10),000 epinephrine 10 mL locally Device details:  Medtronic Reveal Linq model number [id number], serial number [id number] Procedure details:  After the risks and benefits of the procedure were discussed the patient provided informed consent. The patient was prepped and draped in usual sterile fashion. Local anesthesia was administered to an area 2 cm to the left of the sternum in the 4th intercostal space. A cutaneous incision was made using the incision tool. The introducer was then used to create a subcutaneous tunnel and carefully deploy the device. Local pressure was held to ensure hemostasis.  The incision was closed with SteriStrips and a sterile dressing was applied.  R waves 0.67 mV.  Sanda Klein, MD, [facility] [phone] office [phone] pager 10/19/2020 12:53 PM

## 2020-10-21 ENCOUNTER — Encounter: Payer: Self-pay | Admitting: Physician Assistant

## 2020-10-21 ENCOUNTER — Other Ambulatory Visit: Payer: Self-pay

## 2020-10-21 ENCOUNTER — Ambulatory Visit (INDEPENDENT_AMBULATORY_CARE_PROVIDER_SITE_OTHER): Payer: PPO | Admitting: Physician Assistant

## 2020-10-21 VITALS — BP 122/82 | HR 77 | Temp 97.3°F | Ht 61.0 in | Wt 194.6 lb

## 2020-10-21 DIAGNOSIS — I472 Ventricular tachycardia: Secondary | ICD-10-CM

## 2020-10-21 DIAGNOSIS — I4729 Other ventricular tachycardia: Secondary | ICD-10-CM

## 2020-10-21 DIAGNOSIS — Z1211 Encounter for screening for malignant neoplasm of colon: Secondary | ICD-10-CM | POA: Diagnosis not present

## 2020-10-21 DIAGNOSIS — E559 Vitamin D deficiency, unspecified: Secondary | ICD-10-CM | POA: Diagnosis not present

## 2020-10-21 DIAGNOSIS — E782 Mixed hyperlipidemia: Secondary | ICD-10-CM

## 2020-10-21 MED ORDER — PANTOPRAZOLE SODIUM 40 MG PO TBEC: 40.0000 mg | DELAYED_RELEASE_TABLET | Freq: Every day | ORAL | 3 refills | Status: DC

## 2020-10-21 NOTE — Progress Notes (Signed)
Subjective:  Patient ID: Tonya Graves, female    DOB: 09-22-1950  Age: 70 y.o. MRN: [id number]  Chief Complaint  Patient presents with  . Hypertension    HPI  Mixed hyperlipidemia  Pt presents with hyperlipidemia.Compliance with treatment has been good; The patient is compliant with medications, maintains a low cholesterol diet , follows up as directed ,The patient denies experiencing any hypercholesterolemia related symptoms. SHE IS CURRENTLY ON LIPITOR 80MG  QD  Pt with history of low vitamin D - due for labwork  Pt with history of CVA in 07/2020 and possible afib - currently wearing a looprecorder and followed by Dr Avon Gully    Current Outpatient Medications on File Prior to Visit  Medication Sig Dispense Refill  . aspirin 81 MG EC tablet Take 81 mg by mouth daily.    . Calcium Carbonate-Vitamin D (CALCIUM 500 + D PO) Take 1 tablet by mouth 2 (two) times daily.    . cyclobenzaprine (FLEXERIL) 5 MG tablet Take 1 tablet (5 mg total) by mouth at bedtime. 30 tablet 1  . Vitamin D, Ergocalciferol, (DRISDOL) 1.25 MG (50000 UNIT) CAPS capsule TAKE 1 CAPSULE BY MOUTH ONE TIME PER WEEK 12 capsule 5  . atorvastatin (LIPITOR) 80 MG tablet Take 1 tablet (80 mg total) by mouth daily. 30 tablet 2  . metoprolol tartrate (LOPRESSOR) 25 MG tablet TAKE 1 TABLET (25 MG TOTAL) BY MOUTH AS NEEDED (FOR PALPITATIONS). 90 tablet 1   Current Facility-Administered Medications on File Prior to Visit  Medication Dose Route Frequency Provider Last Rate Last Admin  . lidocaine-EPINEPHrine (XYLOCAINE W/EPI) 1 %-1:100000 (with pres) injection 10 mL  10 mL Infiltration Once Croitoru, Mihai, MD       Past Medical History:  Diagnosis Date  . Anemia YEARS AGO  . Arthritis    OA  . Bronchitis   . 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 Cataracts, bilateral   . GERD (gastroesophageal reflux disease)   . Heart palpitations 2016 august  . High cholesterol   . History of hiatal  hernia   . History of palpitations    NONE IN LAST FEW YEARS   . Osteopenia   . Prediabetes   . Urine frequency   . UTI (urinary tract infection)   . Varicose veins   . Ventricular tachycardia (paroxysmal) ([place]) 08/2019   30 day monitor.   Past Surgical History:  Procedure Laterality Date  .  C SECTIONS     X 2  . ORAL SURGERY FOR MOUTH CANCER  06-11-2015  . TENDON REPAIR Right  YEARS AGO   LITTLE FINGER  . TOTAL KNEE ARTHROPLASTY Right 07/13/2015   Procedure: RIGHT TOTAL KNEE ARTHROPLASTY;  Surgeon: Gaynelle Arabian, MD;  Location: [facility];  Service: Orthopedics;  Laterality: Right;  . TOTAL KNEE ARTHROPLASTY Left 06/06/2016   Procedure: LEFT TOTAL KNEE ARTHROPLASTY;  Surgeon: Gaynelle Arabian, MD;  Location: [facility];  Service: Orthopedics;  Laterality: Left;    Family History  Problem Relation Age of Onset  . High blood pressure Mother   . Atrial fibrillation Mother   . Stroke Father   . Heart disease Father   . High Cholesterol Father   . Atrial fibrillation Sister   . Cancer Maternal Grandmother   . Leukemia Maternal Grandmother   . Other Maternal Grandfather        rocky [place] spotted fever  . Stroke Paternal Grandmother    Social History  Socioeconomic History  . Marital status: Married    Spouse name: Not on file  . Number of children: Not on file  . Years of education: Not on file  . Highest education level: Not on file  Occupational History  . Not on file  Tobacco Use  . Smoking status: Never Smoker  . Smokeless tobacco: Never Used  Vaping Use  . Vaping Use: Never used  Substance and Sexual Activity  . Alcohol use: No  . Drug use: No  . Sexual activity: Not on file  Other Topics Concern  . Not on file  Social History Narrative  . Not on file   Social Determinants of Health   Financial Resource Strain: Not on file  Food Insecurity: Not on file  Transportation Needs: Not on file  Physical Activity: Not on file  Stress: Not on file  Social Connections:  Not on file    Review of Systems CONSTITUTIONAL: Negative for chills, fatigue, fever, unintentional weight gain and unintentional weight loss.  E/N/T: Negative for ear pain, nasal congestion and sore throat.  CARDIOVASCULAR: Negative for chest pain, dizziness, palpitations and pedal edema.  RESPIRATORY: Negative for recent cough and dyspnea.  GASTROINTESTINAL: Negative for abdominal pain, acid reflux symptoms, constipation, diarrhea, nausea and vomiting.  MSK: Negative for arthralgias and myalgias.  INTEGUMENTARY: Negative for rash.  NEUROLOGICAL: Negative for dizziness and headaches.  PSYCHIATRIC: Negative for sleep disturbance and to question depression screen.  Negative for depression, negative for anhedonia.       Objective:  BP 122/82 (BP Location: Left Arm, Patient Position: Sitting, Cuff Size: Large)   Pulse 77   Temp (!) 97.3 F (36.3 C) (Temporal)   Ht 5\' 1"  (1.549 m)   Wt 194 lb 9.6 oz (88.3 kg)   SpO2 97%   BMI 36.77 kg/m   BP/Weight 10/21/2020 10/13/2020 93/81/0175  Systolic BP 102 585 277  Diastolic BP 82 77 83  Wt. (Lbs) 194.6 194.6 198.2  BMI 36.77 36.77 37.45    Physical Exam PHYSICAL EXAM:   VS: BP 122/82 (BP Location: Left Arm, Patient Position: Sitting, Cuff Size: Large)   Pulse 77   Temp (!) 97.3 F (36.3 C) (Temporal)   Ht 5\' 1"  (1.549 m)   Wt 194 lb 9.6 oz (88.3 kg)   SpO2 97%   BMI 36.77 kg/m   GEN: Well nourished, well developed, in no acute distress   Cardiac: RRR; no murmurs, rubs, or gallops,no edema -  Respiratory:  normal respiratory rate and pattern with no distress - normal breath sounds with no rales, rhonchi, wheezes or rubs MS: no deformity or atrophy  Skin: warm and dry, no rash  Psych: euthymic mood, appropriate affect and demeanor  Diabetic Foot Exam - Simple   No data filed      Lab Results  Component Value Date   WBC 4.6 02/27/2020   HGB 12.8 02/27/2020   HCT 37.7 02/27/2020   PLT 243 02/27/2020   GLUCOSE 98  02/27/2020   CHOL 184 10/13/2020   TRIG 69 10/13/2020   HDL 99 10/13/2020   LDLCALC 72 10/13/2020   ALT 28 10/13/2020   AST 32 10/13/2020   NA 143 02/27/2020   K 4.4 02/27/2020   CL 104 02/27/2020   CREATININE 0.83 02/27/2020   BUN 14 02/27/2020   CO2 24 02/27/2020   TSH 2.860 02/27/2020   INR 1.03 05/27/2016      Assessment & Plan:   1. Mixed hyperlipidemia - Lipid panel  Continue meds as directed 2. Vitamin D insufficiency - VITAMIN D 25 Hydroxy (Vit-D Deficiency, Fractures)  3. Ventricular tachycardia (paroxysmal) (HCC) - CBC with Differential/Platelet - Comprehensive metabolic panel  continue follow up with cardiology  Meds ordered this encounter  Medications  . pantoprazole (PROTONIX) 40 MG tablet    Sig: Take 1 tablet (40 mg total) by mouth daily.    Dispense:  90 tablet    Refill:  3    Order Specific Question:   Supervising Provider    AnswerRochel Brome [phone]    Orders Placed This Encounter  Procedures  . CBC with Differential/Platelet  . Comprehensive metabolic panel  . Lipid panel  . VITAMIN D 25 Hydroxy (Vit-D Deficiency, Fractures)  . Ambulatory referral to Gastroenterology   Follow-up: Return in about 6 months (around 04/20/2021) for chronic fasting follow up.  An After Visit Summary was printed and given to the patient.  Yetta Flock [facility] [phone]

## 2020-10-22 LAB — CBC WITH DIFFERENTIAL/PLATELET
Basophils Absolute: 0 10*3/uL (ref 0.0–0.2)
Basos: 1 %
EOS (ABSOLUTE): 0.1 10*3/uL (ref 0.0–0.4)
Eos: 3 %
Hematocrit: 38.7 % (ref 34.0–46.6)
Hemoglobin: 12.4 g/dL (ref 11.1–15.9)
Immature Grans (Abs): 0 10*3/uL (ref 0.0–0.1)
Immature Granulocytes: 0 %
Lymphocytes Absolute: 1.3 10*3/uL (ref 0.7–3.1)
Lymphs: 30 %
MCH: 27.8 pg (ref 26.6–33.0)
MCHC: 32 g/dL (ref 31.5–35.7)
MCV: 87 fL (ref 79–97)
Monocytes Absolute: 0.5 10*3/uL (ref 0.1–0.9)
Monocytes: 10 %
Neutrophils Absolute: 2.5 10*3/uL (ref 1.4–7.0)
Neutrophils: 56 %
Platelets: 241 10*3/uL (ref 150–450)
RBC: 4.46 x10E6/uL (ref 3.77–5.28)
RDW: 13.7 % (ref 11.7–15.4)
WBC: 4.4 10*3/uL (ref 3.4–10.8)

## 2020-10-22 LAB — COMPREHENSIVE METABOLIC PANEL
ALT: 42 IU/L — ABNORMAL HIGH (ref 0–32)
AST: 51 IU/L — ABNORMAL HIGH (ref 0–40)
Albumin/Globulin Ratio: 1.7 (ref 1.2–2.2)
Albumin: 4.3 g/dL (ref 3.8–4.8)
Alkaline Phosphatase: 105 IU/L (ref 44–121)
BUN/Creatinine Ratio: 22 (ref 12–28)
BUN: 19 mg/dL (ref 8–27)
Bilirubin Total: 0.3 mg/dL (ref 0.0–1.2)
CO2: 22 mmol/L (ref 20–29)
Calcium: 9.4 mg/dL (ref 8.7–10.3)
Chloride: 105 mmol/L (ref 96–106)
Creatinine, Ser: 0.86 mg/dL (ref 0.57–1.00)
GFR calc Af Amer: 80 mL/min/{1.73_m2} (ref >59)
GFR calc non Af Amer: 70 mL/min/{1.73_m2} (ref >59)
Globulin, Total: 2.6 g/dL (ref 1.5–4.5)
Glucose: 105 mg/dL — ABNORMAL HIGH (ref 65–99)
Potassium: 4.7 mmol/L (ref 3.5–5.2)
Sodium: 143 mmol/L (ref 134–144)
Total Protein: 6.9 g/dL (ref 6.0–8.5)

## 2020-10-22 LAB — LIPID PANEL
Chol/HDL Ratio: 1.7 ratio (ref 0.0–4.4)
Cholesterol, Total: 178 mg/dL (ref 100–199)
HDL: 107 mg/dL (ref >39)
LDL Chol Calc (NIH): 62 mg/dL (ref 0–99)
Triglycerides: 44 mg/dL (ref 0–149)
VLDL Cholesterol Cal: 9 mg/dL (ref 5–40)

## 2020-10-22 LAB — VITAMIN D 25 HYDROXY (VIT D DEFICIENCY, FRACTURES): Vit D, 25-Hydroxy: 71.1 ng/mL (ref 30.0–100.0)

## 2020-10-22 LAB — CARDIOVASCULAR RISK ASSESSMENT

## 2020-10-29 ENCOUNTER — Encounter: Payer: Self-pay | Admitting: Physician Assistant

## 2020-11-04 ENCOUNTER — Other Ambulatory Visit: Payer: Self-pay

## 2020-11-04 ENCOUNTER — Other Ambulatory Visit: Payer: Self-pay | Admitting: Physician Assistant

## 2020-11-04 ENCOUNTER — Ambulatory Visit (INDEPENDENT_AMBULATORY_CARE_PROVIDER_SITE_OTHER): Payer: PPO | Admitting: Cardiovascular Disease

## 2020-11-04 DIAGNOSIS — I639 Cerebral infarction, unspecified: Secondary | ICD-10-CM

## 2020-11-04 DIAGNOSIS — Z95818 Presence of other cardiac implants and grafts: Secondary | ICD-10-CM

## 2020-11-04 NOTE — Progress Notes (Signed)
POST loop recorder wound check.  Site is very well healed, no signs of infection. She has had palpitations. No events recorded on her device. Excellent signal.  FuU w monthly downloads.

## 2020-11-09 DIAGNOSIS — R1310 Dysphagia, unspecified: Secondary | ICD-10-CM | POA: Diagnosis not present

## 2020-11-09 DIAGNOSIS — D131 Benign neoplasm of stomach: Secondary | ICD-10-CM | POA: Diagnosis not present

## 2020-11-17 DIAGNOSIS — K449 Diaphragmatic hernia without obstruction or gangrene: Secondary | ICD-10-CM | POA: Diagnosis not present

## 2020-11-17 DIAGNOSIS — R1310 Dysphagia, unspecified: Secondary | ICD-10-CM | POA: Diagnosis not present

## 2020-11-18 DIAGNOSIS — Z7689 Persons encountering health services in other specified circumstances: Secondary | ICD-10-CM | POA: Diagnosis not present

## 2020-11-18 DIAGNOSIS — Z1231 Encounter for screening mammogram for malignant neoplasm of breast: Secondary | ICD-10-CM | POA: Diagnosis not present

## 2020-11-18 LAB — HM MAMMOGRAPHY

## 2020-11-23 ENCOUNTER — Telehealth: Payer: Self-pay

## 2020-11-23 MED ORDER — APIXABAN 5 MG PO TABS: 5.0000 mg | ORAL_TABLET | Freq: Two times a day (BID) | ORAL | 6 refills | Status: DC

## 2020-11-23 NOTE — Telephone Encounter (Signed)
Alert for 4 AF events; longest logged 1 hour 10 minutes; ECGs suggest AF w/ average rate 80's to 180's bpm. No history noted in EMR  Pt implanted for Crytpogenic stroke/ palpitations.  Current meds include ASA81mg , PRN Metoprolol 25 mg as needed for palpitations.    Spoke with pt.  She awoke 3/20 feeling her heart racing/ palpitations.  She denies any other cardiac symptoms.  She did not take her PRN Metoprolol.    Forwarding to MD for review/ recommendations.

## 2020-11-23 NOTE — Telephone Encounter (Signed)
Left a message for the patient to call back.  

## 2020-11-23 NOTE — Telephone Encounter (Signed)
Patient call back returning call/message that was life

## 2020-11-23 NOTE — Telephone Encounter (Signed)
Spoke with pt, aware of the recommendations. New script sent to the pharmacy and Follow up scheduled

## 2020-11-23 NOTE — Telephone Encounter (Signed)
Loop recorder (implanted for cryptogenic stroke) shows definite paroxysmal atrial fibrillation. Please start Eliquis 5 mg twice daily, stop ASA and arrange f/u w Dr. Gwenlyn Found or APP.

## 2020-11-24 ENCOUNTER — Ambulatory Visit (INDEPENDENT_AMBULATORY_CARE_PROVIDER_SITE_OTHER): Payer: PPO

## 2020-11-24 DIAGNOSIS — I639 Cerebral infarction, unspecified: Secondary | ICD-10-CM

## 2020-11-25 LAB — CUP PACEART REMOTE DEVICE CHECK
Date Time Interrogation Session: 20220321180248
Implantable Pulse Generator Implant Date: 20220214

## 2020-12-02 NOTE — Progress Notes (Signed)
Carelink Summary Report / Loop Recorder 

## 2020-12-08 ENCOUNTER — Other Ambulatory Visit: Payer: Self-pay

## 2020-12-08 ENCOUNTER — Ambulatory Visit: Payer: PPO | Admitting: Cardiovascular Disease

## 2020-12-08 ENCOUNTER — Encounter: Payer: Self-pay | Admitting: Cardiovascular Disease

## 2020-12-08 VITALS — BP 122/78 | HR 75 | Ht 61.0 in | Wt 194.2 lb

## 2020-12-08 DIAGNOSIS — I4729 Other ventricular tachycardia: Secondary | ICD-10-CM

## 2020-12-08 DIAGNOSIS — I472 Ventricular tachycardia: Secondary | ICD-10-CM | POA: Diagnosis not present

## 2020-12-08 DIAGNOSIS — R002 Palpitations: Secondary | ICD-10-CM | POA: Diagnosis not present

## 2020-12-08 DIAGNOSIS — I480 Paroxysmal atrial fibrillation: Secondary | ICD-10-CM | POA: Diagnosis not present

## 2020-12-08 DIAGNOSIS — E782 Mixed hyperlipidemia: Secondary | ICD-10-CM | POA: Diagnosis not present

## 2020-12-08 NOTE — Patient Instructions (Signed)
Medication Instructions:  Your physician recommends that you continue on your current medications as directed. Please refer to the Current Medication list given to you today.  *If you need a refill on your cardiac medications before your next appointment, please call your pharmacy*   Follow-Up: At [facility], you and your health needs are our priority.  As part of our continuing mission to provide you with exceptional heart care, we have created designated Provider Care Teams.  These Care Teams include your primary Cardiologist (physician) and Advanced Practice Providers (APPs -  Physician Assistants and Nurse Practitioners) who all work together to provide you with the care you need, when you need it.  We recommend signing up for the patient portal called "MyChart".  Sign up information is provided on this After Visit Summary.  MyChart is used to connect with patients for Virtual Visits (Telemedicine).  Patients are able to view lab/test results, encounter notes, upcoming appointments, etc.  Non-urgent messages can be sent to your provider as well.   To learn more about what you can do with MyChart, go to [email or web address].    Your next appointment:   6 month(s)  The format for your next appointment:   In Person  Provider:   You will see one of the following Advanced Practice Providers on your designated Care Team:    Callie Goodrich, PA-C  Jesse Cleaver, FNP  Then, Jonathan Berry, MD will plan to see you again in 12 month(s). 

## 2020-12-08 NOTE — Assessment & Plan Note (Signed)
Tonya Graves had a loop recorder implanted by Dr. Sallyanne Kuster 10/19/2020 for stroke.  This showed an hour of PAF on the download 11/25/2020 and she was ultimately begun on Eliquis oral anticoagulation for stroke prophylaxis.

## 2020-12-08 NOTE — Progress Notes (Signed)
12/08/2020 Tonya Graves   01/14/1951  [id number]  Primary Physician [name], PA-C Primary Cardiologist: [name] MD [name], [place]  HPI:  Tonya Graves is a 70 y.o.  mildly overweight married Caucasian female mother 2, grandmother and 3 grandchildren referred to me by Dr. Tobie Poet for cardiology evaluation because of palpitations.I last saw her in the office  10/13/2020.  She has seen Dr. Bettina Gavia remotely for similar symptoms that occur during stressful times in her life and was prescribed when necessary metoprolol. She has no cardiac risk factors. She's never had a heart attack or stroke. She has occasional atypical chest pain with does not sound cardiac. Apparently her mother-in-law moved in with them towards the end of December2018 which caused a lot of stress at that time. Unfortunately, she has since passed away 11-30-2022 of last year and her mother has subsequent passed away as well. I did perform an event monitor which showed sinus rhythm with PACs and short atrial runs and a 2D echo that was entirely normal.  She had a stroke on 07/10/2020 and was treated at [facility].  Work-up was unrevealing including 2D echo in 2-week event monitor.  She did have some palpitations in January.    Dr. Sallyanne Kuster implanted a loop recorder at my request on 10/19/2020.  Since I saw her back 2 months ago she did have an hour of PAF demonstrated on loop recorder encephali was begun on Eliquis oral anticoagulation for stroke prophylaxis.   Current Meds  Medication Sig  . apixaban (ELIQUIS) 5 MG TABS tablet Take 1 tablet (5 mg total) by mouth 2 (two) times daily.  Marland Kitchen atorvastatin (LIPITOR) 80 MG tablet TAKE 1 TABLET BY MOUTH EVERY DAY  . Calcium Carbonate-Vitamin D (CALCIUM 500 + D PO) Take 1 tablet by mouth 2 (two) times daily.  . cyclobenzaprine (FLEXERIL) 5 MG tablet Take 1 tablet (5 mg total) by mouth at bedtime.  . metoprolol tartrate (LOPRESSOR) 25 MG tablet TAKE 1 TABLET  (25 MG TOTAL) BY MOUTH AS NEEDED (FOR PALPITATIONS).  . pantoprazole (PROTONIX) 40 MG tablet Take 1 tablet (40 mg total) by mouth daily.  . Vitamin D, Ergocalciferol, (DRISDOL) 1.25 MG (50000 UNIT) CAPS capsule TAKE 1 CAPSULE BY MOUTH ONE TIME PER WEEK   Current Facility-Administered Medications for the 12/08/20 encounter (Office Visit) with [name], MD  Medication  . lidocaine-EPINEPHrine (XYLOCAINE W/EPI) 1 %-1:100000 (with pres) injection 10 mL     No Known Allergies  Social History   Socioeconomic History  . Marital status: Married    Spouse name: Not on file  . Number of children: Not on file  . Years of education: Not on file  . Highest education level: Not on file  Occupational History  . Not on file  Tobacco Use  . Smoking status: Never Smoker  . Smokeless tobacco: Never Used  Vaping Use  . Vaping Use: Never used  Substance and Sexual Activity  . Alcohol use: No  . Drug use: No  . Sexual activity: Not on file  Other Topics Concern  . Not on file  Social History Narrative  . Not on file   Social Determinants of Health   Financial Resource Strain: Not on file  Food Insecurity: Not on file  Transportation Needs: Not on file  Physical Activity: Not on file  Stress: Not on file  Social Connections: Not on file  Intimate Partner Violence: Not on file  Review of Systems: General: negative for chills, fever, night sweats or weight changes.  Cardiovascular: negative for chest pain, dyspnea on exertion, edema, orthopnea, palpitations, paroxysmal nocturnal dyspnea or shortness of breath Dermatological: negative for rash Respiratory: negative for cough or wheezing Urologic: negative for hematuria Abdominal: negative for nausea, vomiting, diarrhea, bright red blood per rectum, melena, or hematemesis Neurologic: negative for visual changes, syncope, or dizziness All other systems reviewed and are otherwise negative except as noted above.    Blood  pressure 122/78, pulse 75, height 5\' 1"  (1.549 m), weight 194 lb 3.2 oz (88.1 kg).  General appearance: alert and no distress Neck: no adenopathy, no carotid bruit, no JVD, supple, symmetrical, trachea midline and thyroid not enlarged, symmetric, no tenderness/mass/nodules Lungs: clear to auscultation bilaterally Heart: regular rate and rhythm, S1, S2 normal, no murmur, click, rub or gallop Extremities: extremities normal, atraumatic, no cyanosis or edema Pulses: 2+ and symmetric Skin: Skin color, texture, turgor normal. No rashes or lesions Neurologic: Alert and oriented X 3, normal strength and tone. Normal symmetric reflexes. Normal coordination and gait  EKG sinus rhythm at 75 with left axis deviation and nonspecific ST and T wave changes. I  Personally reviewed this EKG.  ASSESSMENT AND PLAN:   PAF (paroxysmal atrial fibrillation) ([place]) Ms. Bergren had a loop recorder implanted by Dr. Sallyanne Kuster 10/19/2020 for stroke.  This showed an hour of PAF on the download 11/25/2020 and she was ultimately begun on Eliquis oral anticoagulation for stroke prophylaxis.  Mixed hyperlipidemia History of hyperlipidemia on high-dose atorvastatin with lipid profile performed 10/21/2020 revealing total cholesterol 178, LDL of 62 and HDL 107.      [name] MD FACP,FACC,FAHA, FSCAI 12/08/2020 10:00 AM

## 2020-12-08 NOTE — Assessment & Plan Note (Signed)
History of hyperlipidemia on high-dose atorvastatin with lipid profile performed 10/21/2020 revealing total cholesterol 178, LDL of 62 and HDL 107.

## 2020-12-21 ENCOUNTER — Telehealth: Payer: Self-pay | Admitting: Emergency Medicine

## 2020-12-21 NOTE — Telephone Encounter (Signed)
Although the episodes are very fast (AFib w RVR, mean ventricular rate 176 bpm) they are also very brief (under 15 minutes), relatively infrequent (not every month) and asymptomatic. I am not sure that additional medications would help at this time, will continue to monitor. She has PRN metoprolol prescribed for symptomatic events - has not used any yet.

## 2020-12-21 NOTE — Telephone Encounter (Signed)
Carelink alert 12/21/20 for 2 AF/ 2 tachy events on 04/16- 04/17. Known PAF. Patient assessed and is non-symptomatic today. Patient states compliance with all medications. [place] Eliquis 5mg 's. Routing to Dr. Sallyanne Kuster just [place].

## 2020-12-25 ENCOUNTER — Ambulatory Visit (INDEPENDENT_AMBULATORY_CARE_PROVIDER_SITE_OTHER): Payer: PPO

## 2020-12-25 DIAGNOSIS — I639 Cerebral infarction, unspecified: Secondary | ICD-10-CM | POA: Diagnosis not present

## 2020-12-28 LAB — CUP PACEART REMOTE DEVICE CHECK
Date Time Interrogation Session: 20220423180032
Implantable Pulse Generator Implant Date: 20220214

## 2021-01-08 NOTE — Progress Notes (Signed)
Carelink Summary Report / Loop Recorder 

## 2021-01-22 NOTE — Progress Notes (Signed)
NEUROLOGY FOLLOW UP OFFICE NOTE  Tonya Graves [id number]  Assessment/Plan:   1.  Left PCA territory infarct, likely cardioembolic 2.  Paroxysmal atrial fibrillation 3.  Hyperlipidemia  1.  Secondary stroke prevention as managed by PCP/cardiology: - Eliquis - atorvastatin 80mg .  LDL goal less than 70 - normotensive blood pressure - Hgb A1c goal less than 70 2.  Follow up 6 months.  Subjective:  Tonya Graves. Tonya Graves is a 70 year old right-handed female with HLD and prior smoker who follows up for stroke.  UPDATE: Current medications:  Eliquis, atorvastatin 80mg   Underwent implantable loop recorder placement in February which subsequently demonstrated PAF and she was started on Eliquis.  LDL from February was 62.  HISTORY: On 07/11/2020, she developed acute onset of dizziness for a couple of minutes and then noted right visual field cut.  She has known paroxysmal sinus  For which she takes metoprolol as needed.  For several hours prior to this, she noted palpitations which felt more severe and did not respond as well to the metoprolol.  She was taken to [facility] where she received IV tPA.  CTA head and neck showed ischemic stroke in the left P2 territory as well as atheromatous changes in the carotid bifurcation but no significant intracranial or extracranial stenosis or large vessel occlusion.  She was transferred to [facility] for further care.  Echocardiogram showed EF >55% with mild mitral calcification but no cardiac source of embolus.  Hgb A1c was 5.6.  LDL was 111.  She was also started on ASA 81mg  and atorvastatin 80mg  daily.  Telemetry showed no significant arrhythmia but given history of palpitations, a fib was a concern.  She was started on a ZIO patch on discharge which subsequently demonstrated symptomatic brief episodes of paroxysmal atrial tachycardia but no atrial fibrillation.    PAST MEDICAL HISTORY: Past Medical History:  Diagnosis Date  . Anemia  YEARS AGO  . Arthritis    OA  . Bronchitis   . 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 Cataracts, bilateral   . GERD (gastroesophageal reflux disease)   . Heart palpitations 2016 august  . High cholesterol   . History of hiatal hernia   . History of palpitations    NONE IN LAST FEW YEARS   . Osteopenia   . Prediabetes   . Urine frequency   . UTI (urinary tract infection)   . Varicose veins   . Ventricular tachycardia (paroxysmal) ([place]) 08/2019   30 day monitor.    MEDICATIONS: Current Outpatient Medications on File Prior to Visit  Medication Sig Dispense Refill  . apixaban (ELIQUIS) 5 MG TABS tablet Take 1 tablet (5 mg total) by mouth 2 (two) times daily. 60 tablet 6  . atorvastatin (LIPITOR) 80 MG tablet TAKE 1 TABLET BY MOUTH EVERY DAY 90 tablet 1  . Calcium Carbonate-Vitamin D (CALCIUM 500 + D PO) Take 1 tablet by mouth 2 (two) times daily.    . cyclobenzaprine (FLEXERIL) 5 MG tablet Take 1 tablet (5 mg total) by mouth at bedtime. 30 tablet 1  . metoprolol tartrate (LOPRESSOR) 25 MG tablet TAKE 1 TABLET (25 MG TOTAL) BY MOUTH AS NEEDED (FOR PALPITATIONS). 90 tablet 1  . pantoprazole (PROTONIX) 40 MG tablet Take 1 tablet (40 mg total) by mouth daily. 90 tablet 3  . Vitamin D, Ergocalciferol, (DRISDOL) 1.25 MG (50000 UNIT) CAPS capsule TAKE 1 CAPSULE BY MOUTH ONE TIME  PER WEEK 12 capsule 5   Current Facility-Administered Medications on File Prior to Visit  Medication Dose Route Frequency Provider Last Rate Last Admin  . lidocaine-EPINEPHrine (XYLOCAINE W/EPI) 1 %-1:100000 (with pres) injection 10 mL  10 mL Infiltration Once Croitoru, Mihai, MD        ALLERGIES: No Known Allergies  FAMILY HISTORY: Family History  Problem Relation Age of Onset  . High blood pressure Mother   . Atrial fibrillation Mother   . Stroke Father   . Heart disease Father   . High Cholesterol Father   . Atrial fibrillation Sister   . Cancer Maternal Grandmother   .  Leukemia Maternal Grandmother   . Other Maternal Grandfather        rocky [place] spotted fever  . Stroke Paternal Grandmother       Objective:  Blood pressure (!) 156/71, pulse 82, height 5\' 1"  (1.549 m), weight 195 lb 3.2 oz (88.5 kg), SpO2 97 %. General: No acute distress.  Patient appears well-groomed.   Head:  Normocephalic/atraumatic Eyes:  Fundi examined but not visualized Neck: supple, no paraspinal tenderness, full range of motion Heart:  Regular rate and rhythm Lungs:  Clear to auscultation bilaterally Back: No paraspinal tenderness Neurological Exam: alert and oriented to person, place, and time. Speech fluent and not dysarthric, language intact.  CN II-XII intact. Bulk and tone normal, muscle strength 5/5 throughout.  Sensation to light touch  intact.  Deep tendon reflexes 2+ throughout.  Finger to nose testing intact.  Gait steady, Romberg negative.   Metta Clines, DO  CC: Tonya Duncans, PA-C

## 2021-01-25 ENCOUNTER — Ambulatory Visit: Payer: PPO | Admitting: Neurology

## 2021-01-25 ENCOUNTER — Other Ambulatory Visit: Payer: Self-pay

## 2021-01-25 ENCOUNTER — Ambulatory Visit (INDEPENDENT_AMBULATORY_CARE_PROVIDER_SITE_OTHER): Payer: PPO

## 2021-01-25 ENCOUNTER — Encounter: Payer: Self-pay | Admitting: Neurology

## 2021-01-25 VITALS — BP 156/71 | HR 82 | Ht 61.0 in | Wt 195.2 lb

## 2021-01-25 DIAGNOSIS — I639 Cerebral infarction, unspecified: Secondary | ICD-10-CM

## 2021-01-25 DIAGNOSIS — E785 Hyperlipidemia, unspecified: Secondary | ICD-10-CM

## 2021-01-25 DIAGNOSIS — I480 Paroxysmal atrial fibrillation: Secondary | ICD-10-CM | POA: Diagnosis not present

## 2021-01-25 DIAGNOSIS — I63432 Cerebral infarction due to embolism of left posterior cerebral artery: Secondary | ICD-10-CM | POA: Diagnosis not present

## 2021-01-25 NOTE — Patient Instructions (Addendum)
1.  Continue Eliquis 2.  Continue atorvastatin 80mg  daily 3.  Follow up 6 months.

## 2021-01-29 LAB — CUP PACEART REMOTE DEVICE CHECK
Date Time Interrogation Session: 20220526175906
Implantable Pulse Generator Implant Date: 20220214

## 2021-02-08 ENCOUNTER — Telehealth: Payer: Self-pay | Admitting: Cardiovascular Disease

## 2021-02-08 DIAGNOSIS — K5900 Constipation, unspecified: Secondary | ICD-10-CM | POA: Diagnosis not present

## 2021-02-08 DIAGNOSIS — R1310 Dysphagia, unspecified: Secondary | ICD-10-CM | POA: Diagnosis not present

## 2021-02-08 NOTE — Telephone Encounter (Signed)
Patient with diagnosis of atrial fibrillation on Eliquis for anticoagulation.    Procedure: EGD/colonoscopy  Please clarify date of procedure.   CHA2DS2-VASc Score = 4  This indicates a 4.8% annual risk of stroke. The patient's score is based upon: CHF History: No HTN History: No Diabetes History: No Stroke History: Yes Vascular Disease History: No Age Score: 1 Gender Score: 1   CrCl 86.3 (46.6 with IBW) Platelet count 241  Patient with history of stroke 07/10/20 (bilateral embolism of posterior cerebral arteries) now 6 months past event.  Would recommend to hold Eliquis x 1 day only. If GI needs 2 days off med, would recommend that patient get 1 dose of enoxaparin on first hold day.    Will review with Dr. Gwenlyn Found for final decision

## 2021-02-08 NOTE — Telephone Encounter (Signed)
   [facility] Pre-operative Risk Assessment    Patient Name: Tonya Graves  DOB: 1951/04/08  MRN: [id number]   Request for surgical clearance:  1. What type of surgery is being performed? EGD/colonoscopy   2. When is this surgery scheduled? 02/05/2021   3. What type of clearance is required (medical clearance vs. Pharmacy clearance to hold med vs. Both)? Both   4. Are there any medications that need to be held prior to surgery and how long? Eliquis - hold starting 03/02/21   5. Practice name and name of physician performing surgery? Kyra Leyland MD with [facility]   6. What is the office phone number? [phone]   7.   What is the office fax number? [phone]  8.   Anesthesia type (None, local, MAC, general) ? Not specified    Tonya Graves 02/08/2021, 12:15 PM  _________________________________________________________________   (provider comments below)

## 2021-02-10 NOTE — Telephone Encounter (Signed)
    Tonya Graves DOB:  02/14/1951  MRN:  [id number]   Primary Cardiologist: Quay Burow, MD  Chart reviewed as part of pre-operative protocol coverage. Given past medical history and time since last visit, based on ACC/AHA guidelines, Tonya Graves would be at acceptable risk for the planned procedure without further cardiovascular testing.   Patient with diagnosis of atrial fibrillation on Eliquis for anticoagulation.    Procedure: EGD/colonoscopy  Please clarify date of procedure.  CHA2DS2-VASc Score = 4  This indicates a 4.8% annual risk of stroke. 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  Would recommend to hold Eliquis x 1 day only. Dr. Gwenlyn Found reviewed this recommendation and agrees to a one day hold only.   I will route this recommendation to the requesting party via Epic fax function and remove from pre-op pool.  Please call with questions.  Kathyrn Drown, NP 02/10/2021, 4:04 PM

## 2021-02-10 NOTE — Telephone Encounter (Signed)
Per Dr Gwenlyn Found, hold Eliquis for 1 day prior to procedure.

## 2021-02-15 NOTE — Progress Notes (Signed)
Carelink Summary Report / Loop Recorder 

## 2021-02-16 ENCOUNTER — Ambulatory Visit: Payer: PPO | Admitting: Neurology

## 2021-03-03 ENCOUNTER — Ambulatory Visit (INDEPENDENT_AMBULATORY_CARE_PROVIDER_SITE_OTHER): Payer: PPO

## 2021-03-03 DIAGNOSIS — I639 Cerebral infarction, unspecified: Secondary | ICD-10-CM

## 2021-03-03 LAB — CUP PACEART REMOTE DEVICE CHECK
Date Time Interrogation Session: 20220628180102
Implantable Pulse Generator Implant Date: 20220214

## 2021-03-04 DIAGNOSIS — R1310 Dysphagia, unspecified: Secondary | ICD-10-CM | POA: Diagnosis not present

## 2021-03-04 DIAGNOSIS — K449 Diaphragmatic hernia without obstruction or gangrene: Secondary | ICD-10-CM | POA: Diagnosis not present

## 2021-03-04 DIAGNOSIS — D649 Anemia, unspecified: Secondary | ICD-10-CM | POA: Diagnosis not present

## 2021-03-04 DIAGNOSIS — Z803 Family history of malignant neoplasm of breast: Secondary | ICD-10-CM | POA: Diagnosis not present

## 2021-03-04 DIAGNOSIS — Z86010 Personal history of colonic polyps: Secondary | ICD-10-CM | POA: Diagnosis not present

## 2021-03-04 DIAGNOSIS — K222 Esophageal obstruction: Secondary | ICD-10-CM | POA: Diagnosis not present

## 2021-03-04 DIAGNOSIS — K317 Polyp of stomach and duodenum: Secondary | ICD-10-CM | POA: Diagnosis not present

## 2021-03-04 DIAGNOSIS — I4891 Unspecified atrial fibrillation: Secondary | ICD-10-CM | POA: Diagnosis not present

## 2021-03-04 DIAGNOSIS — Z1211 Encounter for screening for malignant neoplasm of colon: Secondary | ICD-10-CM | POA: Diagnosis not present

## 2021-03-04 DIAGNOSIS — K5730 Diverticulosis of large intestine without perforation or abscess without bleeding: Secondary | ICD-10-CM | POA: Diagnosis not present

## 2021-03-04 DIAGNOSIS — Z8673 Personal history of transient ischemic attack (TIA), and cerebral infarction without residual deficits: Secondary | ICD-10-CM | POA: Diagnosis not present

## 2021-03-04 DIAGNOSIS — Z800 Family history of malignant neoplasm of digestive organs: Secondary | ICD-10-CM | POA: Diagnosis not present

## 2021-03-04 DIAGNOSIS — K440 Diaphragmatic hernia with obstruction, without gangrene: Secondary | ICD-10-CM | POA: Diagnosis not present

## 2021-03-04 LAB — HM COLONOSCOPY

## 2021-03-17 NOTE — Progress Notes (Signed)
Carelink Summary Report / Loop Recorder 

## 2021-03-22 ENCOUNTER — Ambulatory Visit (INDEPENDENT_AMBULATORY_CARE_PROVIDER_SITE_OTHER): Payer: PPO

## 2021-03-22 ENCOUNTER — Telehealth: Payer: Self-pay

## 2021-03-22 DIAGNOSIS — Z23 Encounter for immunization: Secondary | ICD-10-CM

## 2021-03-22 NOTE — Telephone Encounter (Signed)
[facility] alert received- 7 AF episodes, longest duration 1hr 41min, rate 146-167, burden 7.7%.  Pt. prescribed Eliquis, metoprolol.  Route to triage, poor rate control.  It appears pt in AF from ~10am-2pm on 7/15.    Pt meds include Eliquis 5mg  BID and Metoprolol Tartrate 25mg  as needed for palpitations.    Spoke with pt, she reports she was aware of episode, she felt her heart racing/ palpitations.  She was not able to take her Metoprolol right away, but eventually id take t.  States it took a while before she felt better with her heart rate.  She did however feel fatigued afterward.    Pt reports she has had increased stress lately with a family member dying and her husband being ill.    Pt questioned how frequently she can take Metoprolol since the bottle doesn't give parameters.  Advised it is typically safe to take twice in a day, especially since her dosage is low.    Advised I would forward to Dr. Sallyanne Kuster for clarification on Metoprolol dosage/ frequency.

## 2021-03-22 NOTE — Progress Notes (Signed)
   Covid-19 Vaccination Clinic  Name:  Tonya Graves    MRN: [id number] DOB: 06/04/1951  03/22/2021  Tonya Graves was observed post Covid-19 immunization for 15 minutes without incident. She was provided with Vaccine Information Sheet and instruction to access the V-Safe system.   Tonya Graves was instructed to call 911 with any severe reactions post vaccine: Difficulty breathing  Swelling of face and throat  A fast heartbeat  A bad rash all over body  Dizziness and weakness   Immunizations Administered     Name Date Dose VIS Date Route   Moderna Covid-19 Booster Vaccine 03/22/2021  8:48 AM 0.25 mL 06/24/2020 Intramuscular   Manufacturer: Moderna   Lot: [id number]   [place]: [id number]

## 2021-03-23 MED ORDER — METOPROLOL TARTRATE 25 MG PO TABS: 25.0000 mg | ORAL_TABLET | Freq: Four times a day (QID) | ORAL | 1 refills | Status: DC | PRN

## 2021-03-23 NOTE — Telephone Encounter (Signed)
Spoke with pt, advised of MD recommednation- Metoprolol 1-2 tabs every 6 hours as needed for palpitations.

## 2021-04-05 ENCOUNTER — Ambulatory Visit (INDEPENDENT_AMBULATORY_CARE_PROVIDER_SITE_OTHER): Payer: PPO

## 2021-04-05 DIAGNOSIS — I639 Cerebral infarction, unspecified: Secondary | ICD-10-CM

## 2021-04-05 LAB — CUP PACEART REMOTE DEVICE CHECK
Date Time Interrogation Session: 20220731180331
Implantable Pulse Generator Implant Date: 20220214

## 2021-04-15 ENCOUNTER — Telehealth: Payer: Self-pay

## 2021-04-15 NOTE — Telephone Encounter (Signed)
"  ILR alert report received. Battery status OK. Normal device function. No new symptom, tachy, brady, or pause episodes. Two new AF episodes, one was 4 hours and 18 minutes, on [place] according to previous reports.  Sent to triage. Monthly summary reports and ROV/PRN Kathy Breach, RN, CCDS, [employer]"  Transmission reviewed along with last telephone encounter with A. Berline Lopes, RN 03/22/21. Hx of AT/AF. Successful telephone call to patient to see if she followed MD recommendations of taking Metoprolol 1-2 tabs q 6 hours for palpitations. Per patient she was aware of 4 hour episode 04/14/21; aware of onset and conversion back to SR. Patient followed action plan and took 1 metoprolol at 4 pm and another at 4:40pm with symptoms improving and resolving by 7pm. Patient also acknowledges compliance with eliquis. Patient is provided device clinic contact and encouraged to call if current palpitation action plan fails or if episode frequency increases.

## 2021-04-20 ENCOUNTER — Ambulatory Visit: Payer: PPO | Admitting: Physician Assistant

## 2021-04-29 NOTE — Progress Notes (Signed)
Carelink Summary Report / Loop Recorder 

## 2021-05-02 ENCOUNTER — Telehealth: Payer: Self-pay

## 2021-05-02 NOTE — Telephone Encounter (Signed)
Called pt left VM to call the office to schedule a telephone AWV.  KP

## 2021-05-06 ENCOUNTER — Ambulatory Visit (INDEPENDENT_AMBULATORY_CARE_PROVIDER_SITE_OTHER): Payer: PPO

## 2021-05-06 DIAGNOSIS — I639 Cerebral infarction, unspecified: Secondary | ICD-10-CM | POA: Diagnosis not present

## 2021-05-11 LAB — CUP PACEART REMOTE DEVICE CHECK
Date Time Interrogation Session: 20220905093208
Implantable Pulse Generator Implant Date: 20220214

## 2021-05-12 ENCOUNTER — Other Ambulatory Visit: Payer: Self-pay | Admitting: Physician Assistant

## 2021-05-13 ENCOUNTER — Ambulatory Visit: Payer: PPO | Admitting: Physician Assistant

## 2021-05-14 ENCOUNTER — Telehealth: Payer: Self-pay

## 2021-05-14 NOTE — Telephone Encounter (Signed)
'  LINQ alert received.   1 new AF event 9/8 @ 01:39, duration 6hrs, 65mn, mean HR 182 Eliquis, Metoprolol prescribed, also prn Metoprolol for symptoms. Route to triage for RVR, long duration"  Patient called and reports she woke up during the night go to the bathroom, then after felt her heart rate increasing. States she could feel palpitations but no other complaints. Took lopressor 25 mg then went back to sleep. States she was advised if it did not come back down after the first dose to take another, although states she fell back asleep. Patient reports systolic BP was [id number] palpitations were occurring. Reports compliance with medications on file including Lopressor 25 every 6 hours as needed, Eliquis 5 mg BID. Advised I will forward to Dr. CSallyanne Kusterand will call with any changes or recommendations. Appreciative of call.

## 2021-05-17 ENCOUNTER — Ambulatory Visit (INDEPENDENT_AMBULATORY_CARE_PROVIDER_SITE_OTHER): Payer: PPO | Admitting: Physician Assistant

## 2021-05-17 ENCOUNTER — Other Ambulatory Visit: Payer: Self-pay

## 2021-05-17 ENCOUNTER — Encounter: Payer: Self-pay | Admitting: Physician Assistant

## 2021-05-17 VITALS — BP 122/76 | HR 88 | Temp 97.3°F | Ht 61.0 in | Wt 192.6 lb

## 2021-05-17 DIAGNOSIS — E559 Vitamin D deficiency, unspecified: Secondary | ICD-10-CM | POA: Diagnosis not present

## 2021-05-17 DIAGNOSIS — E782 Mixed hyperlipidemia: Secondary | ICD-10-CM | POA: Diagnosis not present

## 2021-05-17 DIAGNOSIS — Z0000 Encounter for general adult medical examination without abnormal findings: Secondary | ICD-10-CM

## 2021-05-17 DIAGNOSIS — R5383 Other fatigue: Secondary | ICD-10-CM | POA: Diagnosis not present

## 2021-05-17 DIAGNOSIS — Z23 Encounter for immunization: Secondary | ICD-10-CM

## 2021-05-17 NOTE — Progress Notes (Signed)
.  Subjective:   Tonya Graves is a 70 y.o. female who presents for Medicare Annual (Subsequent) preventive examination.  Review of Systems    CONSTITUTIONAL: Negative for chills, fatigue, fever, unintentional weight gain and unintentional weight loss.  E/N/T: Negative for ear pain, nasal congestion and sore throat.  CARDIOVASCULAR: Negative for chest pain, dizziness, palpitations and pedal edema.  RESPIRATORY: Negative for recent cough and dyspnea.  GASTROINTESTINAL: Negative for abdominal pain, acid reflux symptoms, constipation, diarrhea, nausea and vomiting.  MSK: Negative for arthralgias and myalgias.  INTEGUMENTARY: Negative for rash.  NEUROLOGICAL: Negative for dizziness and headaches.  PSYCHIATRIC: Negative for sleep disturbance and to question depression screen.  Negative for depression, negative for anhedonia.      Cardiac Risk Factors include: advanced age (>53mn, >>52women)     Objective:    Today's Vitals   05/17/21 1003  BP: 122/76  Pulse: 88  Temp: (!) 97.3 F (36.3 C)  TempSrc: Temporal  SpO2: 96%  Weight: 192 lb 9.6 oz (87.4 kg)  Height: '5\' 1"'$  (1.549 m)   Body mass index is 36.39 kg/m.  Advanced Directives 05/17/2021 01/25/2021 08/17/2020 06/06/2016 05/27/2016 07/13/2015 07/01/2015  Does Patient Have a Medical Advance Directive? No No No No No No No  Would patient like information on creating a medical advance directive? No - Patient declined - - No - patient declined information No - patient declined information No - patient declined information No - patient declined information    Current Medications (verified) Outpatient Encounter Medications as of 05/17/2021  Medication Sig   apixaban (ELIQUIS) 5 MG TABS tablet Take 1 tablet (5 mg total) by mouth 2 (two) times daily.   atorvastatin (LIPITOR) 80 MG tablet TAKE 1 TABLET BY MOUTH EVERY DAY   Calcium Carbonate-Vitamin D (CALCIUM 500 + D PO) Take 1 tablet by mouth 2 (two) times daily.   cyclobenzaprine  (FLEXERIL) 5 MG tablet Take 1 tablet (5 mg total) by mouth at bedtime.   Vitamin D, Ergocalciferol, (DRISDOL) 1.25 MG (50000 UNIT) CAPS capsule TAKE 1 CAPSULE BY MOUTH ONE TIME PER WEEK   metoprolol tartrate (LOPRESSOR) 25 MG tablet Take 1 tablet (25 mg total) by mouth every 6 (six) hours as needed (For Palpitations).   pantoprazole (PROTONIX) 40 MG tablet Take 1 tablet (40 mg total) by mouth daily.   Facility-Administered Encounter Medications as of 05/17/2021  Medication   lidocaine-EPINEPHrine (XYLOCAINE W/EPI) 1 %-1:100000 (with pres) injection 10 mL    Allergies (verified) Patient has no known allergies.   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   Past Surgical History:  Procedure Laterality Date    C SECTIONS     X 2   ORAL SURGERY FOR MOUTH CANCER  06-11-2015   TENDON REPAIR Right  YEARS AGO   LITTLE FINGER   TOTAL KNEE ARTHROPLASTY  Right 07/13/2015   Procedure: RIGHT TOTAL KNEE ARTHROPLASTY;  Surgeon: Gaynelle Arabian, MD;  Location: [facility];  Service: Orthopedics;  Laterality: Right;   TOTAL KNEE ARTHROPLASTY Left 06/06/2016   Procedure: LEFT TOTAL KNEE ARTHROPLASTY;  Surgeon: Gaynelle Arabian, MD;  Location: [facility];  Service: Orthopedics;  Laterality: Left;   Family History  Problem Relation Age of Onset   High blood pressure Mother    Atrial fibrillation Mother    Stroke Father    Heart disease Father    High Cholesterol Father    Atrial fibrillation Sister    Cancer  Maternal Grandmother    Leukemia Maternal Grandmother    Other Maternal Grandfather        [place] spotted fever   Stroke Paternal Grandmother    Social History   Socioeconomic History   Marital status: Married    Spouse name: Not on file   Number of children: Not on file   Years of education: Not on file   Highest education level: Not on file  Occupational History   Not on file  Tobacco Use   Smoking status: Never   Smokeless tobacco: Never  Vaping Use   Vaping Use: Never used  Substance and Sexual Activity   Alcohol use: No   Drug use: No   Sexual activity: Not on file  Other Topics Concern   Not on file  Social History Narrative   Not on file   Social Determinants of Health   Financial Resource Strain: Low Risk    Difficulty of Paying Living Expenses: Not hard at all  Food Insecurity: No Food Insecurity   Worried About Charity fundraiser in the Last Year: Never true   [place] in the Last Year: Never true  Transportation Needs: No Transportation Needs   Lack of Transportation (Medical): No   Lack of Transportation (Non-Medical): No  Physical Activity: Inactive   Days of Exercise per Week: 0 days   Minutes of Exercise per Session: 0 min  Stress: Stress Concern Present   Feeling of Stress : Rather much  Social Connections: Unknown   Frequency of Communication with Friends and Family: More than three times a week   Frequency of Social Gatherings with Friends and Family: More than three times a week   Attends Religious Services: Not on Electrical engineer or Organizations: Yes   Attends Music therapist: More than 4 times per year   Marital Status: Married    Tobacco Counseling Counseling given: Not Answered   Clinical Intake:  Pre-visit preparation completed: Yes  Pain : No/denies pain     BMI - recorded: 36.39 Nutritional Status: BMI > 30  Obese Nutritional Risks: None Diabetes: No  How often do you need to  have someone help you when you read instructions, pamphlets, or other written materials from your doctor or pharmacy?: 1 - Never What is the last grade level you completed in school?: College  Diabetic?NO  Interpreter Needed?: No  Information entered by :: Philipp Ovens   Activities of Daily Living In your present state of health, do you have any difficulty performing the following activities: 05/17/2021 07/15/2020  Hearing? N N  Vision? N N  Difficulty concentrating or making decisions? N N  Walking or climbing stairs? N N  Dressing or bathing? N N  Doing errands, shopping? N N  Preparing Food and eating ? N -  Using the Toilet? N -  In the past six months, have you accidently leaked urine? N -  Do you have problems with loss of bowel control? N -  Managing your Medications? N -  Managing your Finances? N -  Housekeeping or managing your Housekeeping? N -  Some recent data might be hidden    Patient Care Team: [name], PA-C as PCP - General (Physician Assistant) [name], MD as PCP - Cardiology (Cardiology)  Indicate any recent Medical Services you may have received from other than [facility] providers in the past year (date may be approximate).     Assessment:   This is a routine wellness examination for [place].  Hearing/Vision screen No results found.  Dietary issues and exercise activities discussed: Current Exercise Habits: Home exercise routine, Intensity: Mild, Exercise limited by: None identified   Goals Addressed   None   Depression Screen PHQ 2/9 Scores 05/17/2021 02/25/2020  PHQ - 2 Score 0 0  PHQ- 9 Score - 5    Fall Risk Fall Risk  05/17/2021 01/25/2021 08/17/2020 07/15/2020 02/25/2020  Falls in the past year? 0 0 0 0 0  Comment - - - - -  Number falls in past yr: 0 0 0 0 0  Injury with Fall? 0 0 0 0 0  Risk for fall due to : No Fall Risks - - - No Fall Risks  Follow up Education provided - - - Falls evaluation completed    FALL RISK  PREVENTION PERTAINING TO THE HOME:  Any stairs in or around the home? Yes  If so, are there any without handrails? Yes  Home free of loose throw rugs in walkways, pet beds, electrical cords, etc? Yes  Adequate lighting in your home to reduce risk of falls? Yes   ASSISTIVE DEVICES UTILIZED TO PREVENT FALLS:  Life alert? No  Use of a cane, walker or w/c? No  Grab bars in the bathroom? No  Shower chair or bench in shower? No  Elevated toilet seat or a handicapped toilet? No   TIMED UP AND GO:  Was the test performed? No .  Length of time to ambulate 10 feet: n/a sec.     Cognitive Function:     6CIT Screen 05/17/2021  What Year? 0 points  What month? 0 points  What time? 0 points  Count back from 20 0 points  Months in reverse 0 points  Repeat phrase 0 points  Total Score 0    Immunizations Immunization History  Administered Date(s) Administered   Fluad Quad(high Dose 65+) 05/12/2020   Influenza-Unspecified 06/26/2019   Moderna SARS-COV2 Booster Vaccination 09/15/2020, 03/22/2021   Moderna Sars-Covid-2 Vaccination 10/31/2019, 11/29/2019   Pneumococcal Conjugate-13 03/28/2017   Pneumococcal Polysaccharide-23 06/13/2018, 07/13/2020   Tdap 03/23/2016    TDAP status: Up to date  Flu Vaccine status: Completed at today's visit  Pneumococcal vaccine status: Up to date  Covid-19 vaccine status: Completed vaccines  Qualifies for Shingles Vaccine? Yes   Zostavax completed No   Shingrix Completed?: No.    Education has been provided regarding the importance of this vaccine. Patient has been advised to call insurance company to determine out of pocket expense if they have not yet received this vaccine. Advised may also receive vaccine at local pharmacy or Health Dept. Verbalized acceptance and understanding.  Screening Tests Health Maintenance  Topic Date Due   Zoster Vaccines- Shingrix (1 of 2) Never done   COLONOSCOPY (Pts 45-45yr Insurance coverage will need to be  confirmed)  02/03/2021  INFLUENZA VACCINE  04/05/2021   MAMMOGRAM  11/18/2021   TETANUS/TDAP  03/23/2026   DEXA SCAN  Completed   COVID-19 Vaccine  Completed   PNA vac Low Risk Adult  Completed   HPV VACCINES  Aged Out   Hepatitis C Screening  Discontinued    Health Maintenance  Health Maintenance Due  Topic Date Due   Zoster Vaccines- Shingrix (1 of 2) Never done   COLONOSCOPY (Pts 45-3yr Insurance coverage will need to be confirmed)  02/03/2021   INFLUENZA VACCINE  04/05/2021    Colorectal cancer screening: Type of screening: Colonoscopy. Completed 02/2021. Repeat every 5 years  Mammogram status: Completed 11/18/2020. Repeat every year  Bone Density status: Ordered 05/17/2021. Pt provided with contact info and advised to call to schedule appt.  Lung Cancer Screening: (Low Dose CT Chest recommended if Age 69-80years, 30 pack-year currently smoking OR have quit w/in 15years.) does not qualify.   Lung Cancer Screening Referral: N/A  Additional Screening:  Hepatitis C Screening: does qualify; Completed Patient would like to get done today.  Vision Screening: Recommended annual ophthalmology exams for early detection of glaucoma and other disorders of the eye. Is the patient up to date Yes with their annual eye exam?  Yes  Who is the provider or what is the name of the office in which the patient attends annual eye exams? Dr.Groatt If pt is not established with a provider, would they like to be referred to a provider to establish care? No .   Dental Screening: Recommended annual dental exams for proper oral hygiene  Community Resource Referral / Chronic Care Management: CRR required this visit?  No   CCM required this visit?  No      Plan:     I have personally reviewed and noted the following in the patient's chart:   Medical and social history Use of alcohol, tobacco or illicit drugs  Current medications and supplements including opioid prescriptions.   Functional ability and status Nutritional status Physical activity Advanced directives List of other physicians Hospitalizations, surgeries, and ER visits in previous 12 months Vitals Screenings to include cognitive, depression, and falls Referrals and appointments  In addition, I have reviewed and discussed with patient certain preventive protocols, quality metrics, and best practice recommendations. A written personalized care plan for preventive services as well as general preventive health recommendations were provided to patient.     JBurna Forts CWarwick  05/17/2021   Nurse Notes: Face tot Face 30 minute visit encounter.   Ms. CRoath, Thank you for taking time to come for your Medicare Wellness Visit. I appreciate your ongoing commitment to your health goals. Please review the following plan we discussed and let me know if I can assist you in the future.   These are the goals we discussed:  Goals   None     This is a list of the screening recommended for you and due dates:  Health Maintenance  Topic Date Due   Zoster (Shingles) Vaccine (1 of 2) Never done   Colon Cancer Screening  02/03/2021   Flu Shot  04/05/2021   Mammogram  11/18/2021   Tetanus Vaccine  03/23/2026   DEXA scan (bone density measurement)  Completed   COVID-19 Vaccine  Completed   Pneumonia vaccines  Completed   HPV Vaccine  Aged Out   Hepatitis C Screening: USPSTF Recommendation to screen - Ages 126-79yo.  Discontinued   Pt did update flu shot today Due  for fasting labwork as well - ordered Pt given rx for Shingrix vaccine to get at pharmacy

## 2021-05-17 NOTE — Telephone Encounter (Signed)
error 

## 2021-05-18 LAB — CBC WITH DIFFERENTIAL/PLATELET
Basophils Absolute: 0 10*3/uL (ref 0.0–0.2)
Basos: 1 %
EOS (ABSOLUTE): 0.1 10*3/uL (ref 0.0–0.4)
Eos: 1 %
Hematocrit: 34.7 % (ref 34.0–46.6)
Hemoglobin: 11.8 g/dL (ref 11.1–15.9)
Immature Grans (Abs): 0 10*3/uL (ref 0.0–0.1)
Immature Granulocytes: 0 %
Lymphocytes Absolute: 1.4 10*3/uL (ref 0.7–3.1)
Lymphs: 32 %
MCH: 28.8 pg (ref 26.6–33.0)
MCHC: 34 g/dL (ref 31.5–35.7)
MCV: 85 fL (ref 79–97)
Monocytes Absolute: 0.4 10*3/uL (ref 0.1–0.9)
Monocytes: 9 %
Neutrophils Absolute: 2.5 10*3/uL (ref 1.4–7.0)
Neutrophils: 57 %
Platelets: 221 10*3/uL (ref 150–450)
RBC: 4.1 x10E6/uL (ref 3.77–5.28)
RDW: 12.9 % (ref 11.7–15.4)
WBC: 4.4 10*3/uL (ref 3.4–10.8)

## 2021-05-18 LAB — COMPREHENSIVE METABOLIC PANEL
ALT: 25 IU/L (ref 0–32)
AST: 31 IU/L (ref 0–40)
Albumin/Globulin Ratio: 1.7 (ref 1.2–2.2)
Albumin: 4 g/dL (ref 3.8–4.8)
Alkaline Phosphatase: 91 IU/L (ref 44–121)
BUN/Creatinine Ratio: 22 (ref 12–28)
BUN: 18 mg/dL (ref 8–27)
Bilirubin Total: 0.2 mg/dL (ref 0.0–1.2)
CO2: 24 mmol/L (ref 20–29)
Calcium: 9.6 mg/dL (ref 8.7–10.3)
Chloride: 107 mmol/L — ABNORMAL HIGH (ref 96–106)
Creatinine, Ser: 0.81 mg/dL (ref 0.57–1.00)
Globulin, Total: 2.3 g/dL (ref 1.5–4.5)
Glucose: 105 mg/dL — ABNORMAL HIGH (ref 65–99)
Potassium: 4.8 mmol/L (ref 3.5–5.2)
Sodium: 144 mmol/L (ref 134–144)
Total Protein: 6.3 g/dL (ref 6.0–8.5)
eGFR: 79 mL/min/{1.73_m2} (ref >59)

## 2021-05-18 LAB — VITAMIN D 25 HYDROXY (VIT D DEFICIENCY, FRACTURES): Vit D, 25-Hydroxy: 81.2 ng/mL (ref 30.0–100.0)

## 2021-05-18 LAB — LIPID PANEL
Chol/HDL Ratio: 1.7 ratio (ref 0.0–4.4)
Cholesterol, Total: 168 mg/dL (ref 100–199)
HDL: 101 mg/dL (ref >39)
LDL Chol Calc (NIH): 58 mg/dL (ref 0–99)
Triglycerides: 39 mg/dL (ref 0–149)
VLDL Cholesterol Cal: 9 mg/dL (ref 5–40)

## 2021-05-18 LAB — CARDIOVASCULAR RISK ASSESSMENT

## 2021-05-18 LAB — TSH: TSH: 1.65 u[IU]/mL (ref 0.450–4.500)

## 2021-05-18 NOTE — Progress Notes (Signed)
Carelink Summary Report / Loop Recorder 

## 2021-05-22 ENCOUNTER — Other Ambulatory Visit: Payer: Self-pay | Admitting: Physician Assistant

## 2021-05-24 ENCOUNTER — Telehealth: Payer: Self-pay

## 2021-05-24 DIAGNOSIS — Z87891 Personal history of nicotine dependence: Secondary | ICD-10-CM | POA: Diagnosis not present

## 2021-05-24 DIAGNOSIS — G459 Transient cerebral ischemic attack, unspecified: Secondary | ICD-10-CM | POA: Diagnosis not present

## 2021-05-24 DIAGNOSIS — R200 Anesthesia of skin: Secondary | ICD-10-CM | POA: Diagnosis not present

## 2021-05-24 DIAGNOSIS — G5621 Lesion of ulnar nerve, right upper limb: Secondary | ICD-10-CM | POA: Diagnosis not present

## 2021-05-24 DIAGNOSIS — Z8673 Personal history of transient ischemic attack (TIA), and cerebral infarction without residual deficits: Secondary | ICD-10-CM | POA: Diagnosis not present

## 2021-05-24 NOTE — Telephone Encounter (Signed)
Patient called stated she was having a numb feeling in her pinky finger and ring finger, also stated she was having a tingling feeling in her right jaw.  Per Gay Filler patient needs to go to the hospital. Patient has had a store last November.

## 2021-06-03 ENCOUNTER — Inpatient Hospital Stay: Payer: PPO | Admitting: Physician Assistant

## 2021-06-07 ENCOUNTER — Ambulatory Visit (INDEPENDENT_AMBULATORY_CARE_PROVIDER_SITE_OTHER): Payer: PPO

## 2021-06-07 DIAGNOSIS — I639 Cerebral infarction, unspecified: Secondary | ICD-10-CM

## 2021-06-07 LAB — CUP PACEART REMOTE DEVICE CHECK
Date Time Interrogation Session: 20221003000116
Implantable Pulse Generator Implant Date: 20220214

## 2021-06-13 NOTE — Progress Notes (Signed)
Cardiology Clinic Note   Patient Name: Tonya Graves Date of Encounter: 06/14/2021  Primary Care Provider:  [name], PA-C Primary Cardiologist:  Quay Burow, MD  Patient Profile    Tonya Graves 70 year old female presents the clinic today for follow-up evaluation of her palpitations.  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   Past Surgical History:  Procedure Laterality Date    C SECTIONS     X 2   ORAL SURGERY FOR MOUTH CANCER  06-11-2015   TENDON REPAIR Right  YEARS AGO   LITTLE FINGER   TOTAL KNEE ARTHROPLASTY Right 07/13/2015   Procedure: RIGHT TOTAL KNEE ARTHROPLASTY;  Surgeon: Gaynelle Arabian, MD;  Location: [facility];  Service: Orthopedics;  Laterality: Right;   TOTAL KNEE ARTHROPLASTY Left 06/06/2016   Procedure: LEFT TOTAL KNEE ARTHROPLASTY;  Surgeon: Gaynelle Arabian, MD;  Location: [facility];  Service: Orthopedics;  Laterality: Left;    Allergies  No Known Allergies  History of Present Illness    Tonya Graves has a PMH of palpitations, paroxysmal VT, mixed hyperlipidemia, and paroxysmal atrial fibrillation.  She was initially referred to cardiology by Dr. Tobie Poet for an evaluation of her palpitations.  She was seen by cardiology 10/13/2020.  She was also previously been seen by Dr. Bettina Gavia for similar symptoms that occurred during stressful situations.  She was prescribed as needed metoprolol.  She has no cardiac risk factors.  She has never had a MI or  CVA.  She has reported occasional atypical chest discomfort that did not appear to be cardiac.  She reported that her mother-in-law had moved in with them towards the end of December 2018.  The event caused a lot of stress.  Her mother-in-law unfortunately passed away 2019-12-16.  A cardiac event monitor previously showed sinus rhythm with PACs and short atrial runs.  A echocardiogram performed around the same time showed normal findings.  She had a CVA 07/10/2020 and was treated at [facility].  Her work-up with echocardiogram and 2-week cardiac event monitor was unremarkable.  She did note palpitations in January 2022.  She received a implantable loop recorder by Dr. Sallyanne Kuster 10/19/2020.  She was seen in follow-up by Dr. Gwenlyn Found on 12/08/2020.  During that time her ILR did show a hour period of paroxysmal atrial fibrillation.  She was started on apixaban at that time.  She presents to the clinic today for follow-up evaluation states she feels well.  She reports some increased rest with her husband recovering from his most recent surgery.  She reports that he is having periods of hallucination and more trouble swallowing medication due to his Parkinson's.  She continues to occasionally take her metoprolol for periods of increased heart rate/palpitations.  We reviewed precautions for taking a second dose of her metoprolol.  She  expressed understanding.  She feels that one of her triggers for palpitations/irregular heart rate is chocolate as well as increased stress.  She was also in the emergency department 2 weeks ago with numbness in her hand and in her face.  She was diagnosed with TIA.  We reviewed her most recent lipid panel.  We will continue her current medication regimen, have her avoid triggers for palpitations, and plan follow-up for 6 months.  Today she denies chest pain, shortness of breath, lower extremity edema, fatigue, palpitations, melena, hematuria, hemoptysis, diaphoresis, weakness,  presyncope, syncope, orthopnea, and PND.   Home Medications    Prior to Admission medications   Medication Sig Start Date End Date Taking? Authorizing Provider  apixaban (ELIQUIS) 5 MG TABS tablet Take 1 tablet (5 mg total) by mouth 2 (two) times daily. 11/23/20   [name], MD  atorvastatin (LIPITOR) 80 MG tablet TAKE 1 TABLET BY MOUTH EVERY DAY 05/12/21   [name], PA-C  Calcium Carbonate-Vitamin D (CALCIUM 500 + D PO) Take 1 tablet by mouth 2 (two) times daily.    [provider]  cyclobenzaprine (FLEXERIL) 5 MG tablet Take 1 tablet (5 mg total) by mouth at bedtime. 02/25/20   [name], PA-C  metoprolol tartrate (LOPRESSOR) 25 MG tablet Take 1 tablet (25 mg total) by mouth every 6 (six) hours as needed (For Palpitations). 03/23/21 04/22/21  Croitoru, Mihai, MD  pantoprazole (PROTONIX) 40 MG tablet Take 1 tablet (40 mg total) by mouth daily. 10/21/20 11/20/20  [name], PA-C  Vitamin D, Ergocalciferol, (DRISDOL) 1.25 MG (50000 UNIT) CAPS capsule TAKE 1 CAPSULE BY MOUTH ONE TIME PER WEEK 05/24/21   [name], PA-C    Family History    Family History  Problem Relation Age of Onset   High blood pressure Mother    Atrial fibrillation Mother    Stroke Father    Heart disease Father    High Cholesterol Father    Atrial fibrillation Sister    Cancer Maternal Grandmother    Leukemia Maternal Grandmother    Other Maternal Grandfather        rocky [place] spotted fever   Stroke Paternal Grandmother    She indicated that her mother is alive. She indicated that her father is alive. She indicated that both of her sisters are alive. She indicated that her brother is alive. She indicated that her maternal grandmother is deceased. She indicated that her maternal grandfather is deceased. She indicated that her paternal grandmother is deceased. She indicated that her paternal grandfather is deceased. She indicated that her daughter is alive. She indicated that her son is alive. She  indicated that all of her three grandchildren are alive.  Social History    Social History   Socioeconomic History   Marital status: Married    Spouse name: Not on file   Number of children: Not on file   Years of education: Not on file   Highest education level: Not on file  Occupational History   Not on file  Tobacco Use   Smoking status: Never   Smokeless tobacco: Never  Vaping Use   Vaping Use: Never used  Substance and Sexual Activity   Alcohol use: No   Drug use: No   Sexual activity: Not on file  Other Topics Concern   Not on file  Social History Narrative   Not on file   Social Determinants of Health   Financial Resource Strain: Low Risk    Difficulty  of Paying Living Expenses: Not hard at all  Food Insecurity: No Food Insecurity   Worried About [place] in the Last Year: Never true   [place] in the Last Year: Never true  Transportation Needs: No Transportation Needs   Lack of Transportation (Medical): No   Lack of Transportation (Non-Medical): No  Physical Activity: Inactive   Days of Exercise per Week: 0 days   Minutes of Exercise per Session: 0 min  Stress: Stress Concern Present   Feeling of Stress : Rather much  Social Connections: Unknown   Frequency of Communication with Friends and Family: More than three times a week   Frequency of Social Gatherings with Friends and Family: More than three times a week   Attends Religious Services: Not on Electrical engineer or Organizations: Yes   Attends Music therapist: More than 4 times per year   Marital Status: Married  Human resources officer Violence: Not At Risk   Fear of Current or Ex-Partner: No   Emotionally Abused: No   Physically Abused: No   Sexually Abused: No     Review of Systems    General:  No chills, fever, night sweats or weight changes.  Cardiovascular:  No chest pain, dyspnea on exertion, edema, orthopnea, palpitations, paroxysmal nocturnal  dyspnea. Dermatological: No rash, lesions/masses Respiratory: No cough, dyspnea Urologic: No hematuria, dysuria Abdominal:   No nausea, vomiting, diarrhea, bright red blood per rectum, melena, or hematemesis Neurologic:  No visual changes, wkns, changes in mental status. All other systems reviewed and are otherwise negative except as noted above.  Physical Exam    VS:  BP 122/80 (BP Location: Left Arm)   Pulse 69   Ht 5\' 1"  (1.549 m)   Wt 188 lb (85.3 kg)   SpO2 99%   BMI 35.52 kg/m  , BMI Body mass index is 35.52 kg/m. GEN: Well nourished, well developed, in no acute distress. HEENT: normal. Neck: Supple, no JVD, carotid bruits, or masses. Cardiac: RRR, no murmurs, rubs, or gallops. No clubbing, cyanosis, edema.  Radials/DP/PT 2+ and equal bilaterally.  Respiratory:  Respirations regular and unlabored, clear to auscultation bilaterally. GI: Soft, nontender, nondistended, BS + x 4. MS: no deformity or atrophy. Skin: warm and dry, no rash. Neuro:  Strength and sensation are intact. Psych: Normal affect.  Accessory Clinical Findings    Recent Labs: 05/17/2021: ALT 25; BUN 18; Creatinine, Ser 0.81; Hemoglobin 11.8; Platelets 221; Potassium 4.8; Sodium 144; TSH 1.650   Recent Lipid Panel    Component Value Date/Time   CHOL 168 05/17/2021 1046   TRIG 39 05/17/2021 1046   HDL 101 05/17/2021 1046   CHOLHDL 1.7 05/17/2021 1046   LDLCALC 58 05/17/2021 1046    ECG personally reviewed by me today-none today.  Echocardiogram 10/03/2017 Study Conclusions   - Left ventricle: The cavity size was normal. Systolic function was    normal. The estimated ejection fraction was in the range of 55%    to 60%. Wall motion was normal; there were no regional wall    motion abnormalities. Left ventricular diastolic function    parameters were normal.  - Atrial septum: No defect or patent foramen ovale was identified.   Implantable loop recorder 06/07/2021 ILR summary report received.  Battery status OK. Normal device function. No new symptom, tachy, brady, or pause episodes. Monthly summary reports and ROV/PRN  1 new AF event,  duration 2hr 59min duration, mean HR 102  Eliquis, Metoprolol prescribed as well as prn Metoprolol  Burden 4.1%  LR  Assessment & Plan   1.  Paroxysmal atrial fibrillation/paroxysmal VT/palpitations-denies episodes of accelerated or irregular heartbeat.  Reports compliance with apixaban.  ILR report 06/07/2021 showed normal device function, 1 event of atrial fibrillation.  Details above.  Denies bleeding issues.  Echocardiogram 10/03/2017 showed normal LV function with an EF of 55-60%.  Details above. Continue apixaban, metoprolol as needed Heart healthy low-sodium diet-salty 6 given Increase physical activity as tolerated Avoid triggers caffeine, chocolate, EtOH, dehydration etc.  Mixed hyperlipidemia-05/17/2021: Cholesterol, Total 168; HDL 101; LDL Chol Calc (NIH) 58; Triglycerides 39 Continue atorvastatin, Heart healthy low-sodium high-fiber diet Increase physical activity as tolerated  Disposition: Follow-up with Dr. Gwenlyn Found in 6 months.  Jossie Ng. Kasara Schomer NP-C    06/14/2021, 10:07 AM [address] Office [phone] Fax [phone]  Notice: This dictation was prepared with Dragon dictation along with smaller phrase technology. Any transcriptional errors that result from this process are unintentional and may not be corrected upon review.  I spent 14 minutes examining this patient, reviewing medications, and using patient centered shared decision making involving her cardiac care.  Prior to her visit I spent greater than 20 minutes reviewing her past medical history,  medications, and prior cardiac tests.

## 2021-06-14 ENCOUNTER — Ambulatory Visit: Payer: PPO | Admitting: General Practice

## 2021-06-14 ENCOUNTER — Encounter: Payer: Self-pay | Admitting: General Practice

## 2021-06-14 ENCOUNTER — Other Ambulatory Visit: Payer: Self-pay

## 2021-06-14 VITALS — BP 122/80 | HR 69 | Ht 61.0 in | Wt 188.0 lb

## 2021-06-14 DIAGNOSIS — R002 Palpitations: Secondary | ICD-10-CM | POA: Diagnosis not present

## 2021-06-14 DIAGNOSIS — E782 Mixed hyperlipidemia: Secondary | ICD-10-CM | POA: Diagnosis not present

## 2021-06-14 DIAGNOSIS — I480 Paroxysmal atrial fibrillation: Secondary | ICD-10-CM | POA: Diagnosis not present

## 2021-06-14 DIAGNOSIS — I4729 Other ventricular tachycardia: Secondary | ICD-10-CM

## 2021-06-14 NOTE — Patient Instructions (Signed)
Medication Instructions:  The current medical regimen is effective;  continue present plan and medications as directed. Please refer to the Current Medication list given to you today.   *If you need a refill on your cardiac medications before your next appointment, please call your pharmacy*  Lab Work:   Testing/Procedures:  NONE    NONE  Special Instructions PLEASE READ AND FOLLOW SALTY 6-ATTACHED-1,800mg  daily  Please try to avoid these triggers: Do not use any products that have nicotine or tobacco in them. These include cigarettes, e-cigarettes, and chewing tobacco. If you need help quitting, ask your doctor. Eat heart-healthy foods. Talk with your doctor about the right eating plan for you. Exercise regularly as told by your doctor. Stay hydrated Do not drink alcohol, Caffeine or chocolate. Lose weight if you are overweight. Do not use drugs, including cannabis   PLEASE READ AND FOLLOW STRESS REDUCTION TIPS-ATTACHED  PLEASE INCREASE PHYSICAL ACTIVITY AS TOLERATED  Follow-Up: Your next appointment:  6 month(s) In Person with Quay Burow, MD   Please call our office 2 months in advance to schedule this appointment   At [facility], you and your health needs are our priority.  As part of our continuing mission to provide you with exceptional heart care, we have created designated Provider Care Teams.  These Care Teams include your primary Cardiologist (physician) and Advanced Practice Providers (APPs -  Physician Assistants and Nurse Practitioners) who all work together to provide you with the care you need, when you need it.  We recommend signing up for the patient portal called "MyChart".  Sign up information is provided on this After Visit Summary.  MyChart is used to connect with patients for Virtual Visits (Telemedicine).  Patients are able to view lab/test results, encounter notes, upcoming appointments, etc.  Non-urgent messages can be sent to your provider as well.    To learn more about what you can do with MyChart, go to [email or web address].              6 SALTY THINGS TO AVOID     1,800MG  DAILY     Mindfulness-Based Stress Reduction Tips Mindfulness-based stress reduction (MBSR) is a program that helps people learn to practice mindfulness. Mindfulness is the practice of intentionally paying attention to the present moment. MBSR focuses on developing self-awareness, which allows you to respond to life stress without judgment or negative emotions. It can be learned and practiced through techniques such as education, breathing exercises, meditation, and yoga. MBSR includes several mindfulness techniques in one program. MBSR works best when you understand the treatment, are willing to try new things, and can commit to spending time practicing what you learn. MBSR training may include learning about: How your emotions, thoughts, and reactions affect your body. New ways to respond to things that cause negative thoughts to start (triggers). How to notice your thoughts and let go of them. Practicing awareness of everyday things that you normally do without thinking. The techniques and goals of different types of meditation. What are the benefits of MBSR? MBSR can have many benefits, which include helping you to: Develop self-awareness. This refers to knowing and understanding yourself. Learn skills and attitudes that help you to participate in your own health care. Learn new ways to care for yourself. Be more accepting about how things are, and let things go. Be less judgmental and approach things with an open mind. Be patient with yourself and trust yourself more. MBSR has also been shown to: Reduce negative emotions,  such as depression and anxiety. Improve memory and focus. Change how you sense and approach pain. Boost your body's ability to fight infections. Help you connect better with other people. Improve your sense of well-being. Follow  these instructions at home:  Find a local in-person or online MBSR program. Set aside some time regularly for mindfulness practice. Find a mindfulness practice that works best for you. This may include one or more of the following: Meditation. Meditation involves focusing your mind on a certain thought or activity. Breathing awareness exercises. These help you to stay present by focusing on your breath. Body scan. For this practice, you lie down and pay attention to each part of your body from head to toe. You can identify tension and soreness and intentionally relax parts of your body. Yoga. Yoga involves stretching and breathing, and it can improve your ability to move and be flexible. It can also provide an experience of testing your body's limits, which can help you release stress. Mindful eating. This way of eating involves focusing on the taste, texture, color, and smell of each bite of food. Because this slows down eating and helps you feel full sooner, it can be an important part of a weight-loss plan. Find a podcast or recording that provides guidance for breathing awareness, body scan, or meditation exercises. You can listen to these any time when you have a free moment to rest without distractions. Follow your treatment plan as told by your health care provider. This may include taking regular medicines and making changes to your diet or lifestyle as recommended. How to practice mindfulness To do a basic awareness exercise: Find a comfortable place to sit. Pay attention to the present moment. Observe your thoughts, feelings, and surroundings just as they are. Avoid placing judgment on yourself, your feelings, or your surroundings. Make note of any judgment that comes up, and let it go. Your mind may wander, and that is okay. Make note of when your thoughts drift, and return your attention to the present moment. To do basic mindfulness meditation: Find a comfortable place to sit. This  may include a stable chair or a firm floor cushion. Sit upright with your back straight. Let your arms fall next to your side with your hands resting on your legs. If sitting in a chair, rest your feet flat on the floor. If sitting on a cushion, cross your legs in front of you. Keep your head in a neutral position with your chin dropped slightly. Relax your jaw and rest the tip of your tongue on the roof of your mouth. Drop your gaze to the floor. You can close your eyes if you like. Breathe normally and pay attention to your breath. Feel the air moving in and out of your nose. Feel your belly expanding and relaxing with each breath. Your mind may wander, and that is okay. Make note of when your thoughts drift, and return your attention to your breath. Avoid placing judgment on yourself, your feelings, or your surroundings. Make note of any judgment or feelings that come up, let them go, and bring your attention back to your breath. When you are ready, lift your gaze or open your eyes. Pay attention to how your body feels after the meditation. Where to find more information You can find more information about MBSR from: Your health care provider. Community-based meditation centers or programs. Programs offered near you. Summary Mindfulness-based stress reduction (MBSR) is a program that teaches you how to intentionally pay  attention to the present moment. It is used with other treatments to help you cope better with daily stress, emotions, and pain. MBSR focuses on developing self-awareness, which allows you to respond to life stress without judgment or negative emotions. MBSR programs may involve learning different mindfulness practices, such as breathing exercises, meditation, yoga, body scan, or mindful eating. Find a mindfulness practice that works best for you, and set aside time for it on a regular basis. This information is not intended to replace advice given to you by your health care  provider. Make sure you discuss any questions you have with your health care provider. Document Revised: 10/30/2020 Document Reviewed: 05/08/2020 Elsevier Patient Education  2022 [employer].

## 2021-06-14 NOTE — Progress Notes (Signed)
Carelink Summary Report / Loop Recorder 

## 2021-06-19 ENCOUNTER — Other Ambulatory Visit: Payer: Self-pay | Admitting: Cardiovascular Disease

## 2021-06-21 NOTE — Telephone Encounter (Signed)
Prescription refill request for Eliquis received. Indication:afib Last office visit:[place] 06/14/21 Scr: 0.81 05/17/21 Age: 81f Weight: 85.3kg

## 2021-07-07 ENCOUNTER — Telehealth: Payer: Self-pay

## 2021-07-07 NOTE — Telephone Encounter (Signed)
LINQ alert received.  1 new AF event, duration 4hrs 55min, mean HR 130 Last ov 10/10, plan continue Eliquis, metoprolol prn Burden 11.9%,  Route for HR   Spoke with pt, she reports she awoke early yesterday to help her husband and felt her HR was out of rhythm.  Pt took one dose of PRN Metoprolol and went back to bed, when she awoke for the day her BP was 104/72 and her HR registered at 70 although she felt it was still irregular, she did not take another dose of Metoprolol due to low BP.  She has felt fatigued, but is under a lot of stress caring for her husband who is ill.  She also notes that she is not drinking as much fluids as she needs to.    Advsied pt I would forward to DR. Croitoru for review and to determine if any changes are needed due to RVR and increased AF burden.

## 2021-07-08 ENCOUNTER — Ambulatory Visit (INDEPENDENT_AMBULATORY_CARE_PROVIDER_SITE_OTHER): Payer: PPO

## 2021-07-08 DIAGNOSIS — I639 Cerebral infarction, unspecified: Secondary | ICD-10-CM

## 2021-07-08 LAB — CUP PACEART REMOTE DEVICE CHECK
Date Time Interrogation Session: 20221102000906
Implantable Pulse Generator Implant Date: 20220214

## 2021-07-08 MED ORDER — METOPROLOL TARTRATE 25 MG PO TABS: 25.0000 mg | ORAL_TABLET | Freq: Two times a day (BID) | ORAL | 1 refills | Status: DC

## 2021-07-08 NOTE — Telephone Encounter (Signed)
Spoke with patient.  Advised of Dr. Loletha Grayer recommendation to take Metoprolol twice daily.  Pt v/u.  Updated Rx sent to pharmacy.

## 2021-07-13 LAB — CUP PACEART REMOTE DEVICE CHECK
Date Time Interrogation Session: 20221107180025
Implantable Pulse Generator Implant Date: 20220214

## 2021-07-14 NOTE — Progress Notes (Signed)
Carelink Summary Report / Loop Recorder 

## 2021-07-28 NOTE — Progress Notes (Signed)
NEUROLOGY FOLLOW UP OFFICE NOTE  Tonya Graves [id number]  Assessment/Plan:   Transient ischemic attack -already on optimal medical management - no change in management at this time. Left PCA territory infarct, likely cardioembolic Paroxysmal atrial fibrillation Hyperlipidemia  Secondary stroke prevention as managed by PCP/cardiology Eliquis Statin.  LDL goal less than 70 Normotensive blood pressure Hgb A1c goal less than 7 Mediterranean diet Routine exercise Follow up 6 months.  She plans to have procedure for esophageal strictures requiring her to stop Eliquis.  I recommend that she delay any non-emergent procedure to discontinue antithrombotic/anticoagulant for 6 months after a TIA or stroke.   Subjective:  Tonya Graves. Tonya Graves is a 70 year old right-handed female with HLD and prior smoker who follows up for stroke.   UPDATE: Current medications:  Eliquis, atorvastatin 80mg , Lopressor 25mg  BID   On 05/24/2021, she woke up with numbness and tingling of the last 2 fingers on her right hand as well as right lower facial tingling, lasting an hour.  No headache, visual changes or weakness.  She went to the ED at [facility] where CT head was reportedly negative.  She was diagnosed with probable TIA.  LDL at that time was 58.     HISTORY:  On 07/11/2020, she developed acute onset of dizziness for a couple of minutes and then noted right visual field cut.  She has known paroxysmal sinus  For which she takes metoprolol as needed.  For several hours prior to this, she noted palpitations which felt more severe and did not respond as well to the metoprolol.  She was taken to [facility] where she received IV tPA.  CTA head and neck showed ischemic stroke in the left P2 territory as well as atheromatous changes in the carotid bifurcation but no significant intracranial or extracranial stenosis or large vessel occlusion.  She was transferred to [facility] for further care.   Echocardiogram showed EF >55% with mild mitral calcification but no cardiac source of embolus.  Hgb A1c was 5.6.  LDL was 111.  She was also started on ASA 81mg  and atorvastatin 80mg  daily.  Telemetry showed no significant arrhythmia but given history of palpitations, a fib was a concern.  She was started on a ZIO patch on discharge which subsequently demonstrated symptomatic brief episodes of paroxysmal atrial tachycardia but no atrial fibrillation.  Underwent implantable loop recorder placement in February 2022 which subsequently demonstrated PAF and she was started on Eliquis.  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    MEDICATIONS: Current Outpatient Medications on File Prior to Visit  Medication Sig Dispense Refill   apixaban (ELIQUIS) 5 MG TABS tablet TAKE 1 TABLET BY MOUTH TWICE A DAY 180 tablet 1   atorvastatin (LIPITOR) 80 MG tablet TAKE 1 TABLET BY MOUTH EVERY DAY 90 tablet 1  Calcium Carbonate-Vitamin D (CALCIUM 500 + D PO) Take 1 tablet by mouth 2 (two) times daily.     cyclobenzaprine (FLEXERIL) 5 MG tablet Take 1 tablet (5 mg total) by mouth at bedtime. 30 tablet 1   metoprolol tartrate (LOPRESSOR) 25 MG tablet Take 1 tablet (25 mg total) by mouth 2 (two) times daily. 180 tablet 1   pantoprazole (PROTONIX) 40 MG tablet Take 1 tablet (40 mg total) by mouth daily. 90 tablet 3   Vitamin D, Ergocalciferol, (DRISDOL) 1.25 MG (50000 UNIT) CAPS capsule TAKE 1 CAPSULE BY MOUTH ONE  TIME PER WEEK 12 capsule 5   Current Facility-Administered Medications on File Prior to Visit  Medication Dose Route Frequency Provider Last Rate Last Admin   lidocaine-EPINEPHrine (XYLOCAINE W/EPI) 1 %-1:100000 (with pres) injection 10 mL  10 mL Infiltration Once Croitoru, Mihai, MD        ALLERGIES: No Known Allergies  FAMILY HISTORY: Family History  Problem Relation Age of Onset   High blood pressure Mother    Atrial fibrillation Mother    Stroke Father    Heart disease Father    High Cholesterol Father    Atrial fibrillation Sister    Cancer Maternal Grandmother    Leukemia Maternal Grandmother    Other Maternal Grandfather        rocky [place] spotted fever   Stroke Paternal Grandmother       Objective:  Blood pressure 134/78, pulse 63, height 5\' 1"  (1.549 m), weight 198 lb 3.2 oz (89.9 kg), SpO2 98 %. General: No acute distress.  Patient appears well-groomed.   Head:  Normocephalic/atraumatic Eyes:  Fundi examined but not visualized Neck: supple, no paraspinal tenderness, full range of motion Heart:  Regular rate and rhythm Lungs:  Clear to auscultation bilaterally Back: No paraspinal tenderness Neurological Exam: alert and oriented to person, place, and time.  Speech fluent and not dysarthric, language intact.  CN II-XII intact. Bulk and tone normal, muscle strength 5/5 throughout.  Sensation to light touch intact.  Deep tendon reflexes 2+ throughout, toes downgoing.  Finger to nose testing intact.  Gait normal, Romberg negative.   Metta Clines, DO  CC: Tonya Duncans, PA-C

## 2021-08-02 ENCOUNTER — Ambulatory Visit: Payer: PPO | Admitting: Neurology

## 2021-08-02 ENCOUNTER — Encounter: Payer: Self-pay | Admitting: Neurology

## 2021-08-02 ENCOUNTER — Other Ambulatory Visit: Payer: Self-pay

## 2021-08-02 VITALS — BP 134/78 | HR 63 | Ht 61.0 in | Wt 198.2 lb

## 2021-08-02 DIAGNOSIS — I480 Paroxysmal atrial fibrillation: Secondary | ICD-10-CM

## 2021-08-02 DIAGNOSIS — G459 Transient cerebral ischemic attack, unspecified: Secondary | ICD-10-CM

## 2021-08-02 DIAGNOSIS — E785 Hyperlipidemia, unspecified: Secondary | ICD-10-CM

## 2021-08-02 DIAGNOSIS — I63432 Cerebral infarction due to embolism of left posterior cerebral artery: Secondary | ICD-10-CM | POA: Diagnosis not present

## 2021-08-02 NOTE — Patient Instructions (Signed)
Continue to monitor.  No change in management.  After a TIA, I recommended no none-emergent procedures requiring stopping blood thinner for 6 months. Follow up in 6 months.

## 2021-08-05 DIAGNOSIS — Z6834 Body mass index (BMI) 34.0-34.9, adult: Secondary | ICD-10-CM | POA: Diagnosis not present

## 2021-08-05 DIAGNOSIS — Z01419 Encounter for gynecological examination (general) (routine) without abnormal findings: Secondary | ICD-10-CM | POA: Diagnosis not present

## 2021-08-09 ENCOUNTER — Ambulatory Visit (INDEPENDENT_AMBULATORY_CARE_PROVIDER_SITE_OTHER): Payer: PPO

## 2021-08-09 DIAGNOSIS — I639 Cerebral infarction, unspecified: Secondary | ICD-10-CM

## 2021-08-09 LAB — CUP PACEART REMOTE DEVICE CHECK
Date Time Interrogation Session: 20221204231402
Implantable Pulse Generator Implant Date: 20220214

## 2021-08-12 DIAGNOSIS — K5790 Diverticulosis of intestine, part unspecified, without perforation or abscess without bleeding: Secondary | ICD-10-CM | POA: Diagnosis not present

## 2021-08-12 DIAGNOSIS — R634 Abnormal weight loss: Secondary | ICD-10-CM | POA: Diagnosis not present

## 2021-08-12 DIAGNOSIS — K449 Diaphragmatic hernia without obstruction or gangrene: Secondary | ICD-10-CM | POA: Diagnosis not present

## 2021-08-12 DIAGNOSIS — K219 Gastro-esophageal reflux disease without esophagitis: Secondary | ICD-10-CM | POA: Diagnosis not present

## 2021-08-18 NOTE — Progress Notes (Signed)
Carelink Summary Report / Loop Recorder 

## 2021-08-20 DIAGNOSIS — Z96652 Presence of left artificial knee joint: Secondary | ICD-10-CM | POA: Diagnosis not present

## 2021-08-31 ENCOUNTER — Ambulatory Visit (INDEPENDENT_AMBULATORY_CARE_PROVIDER_SITE_OTHER): Payer: PPO

## 2021-08-31 ENCOUNTER — Other Ambulatory Visit: Payer: Self-pay

## 2021-08-31 DIAGNOSIS — Z23 Encounter for immunization: Secondary | ICD-10-CM

## 2021-09-09 ENCOUNTER — Ambulatory Visit (INDEPENDENT_AMBULATORY_CARE_PROVIDER_SITE_OTHER): Payer: PPO

## 2021-09-09 DIAGNOSIS — I639 Cerebral infarction, unspecified: Secondary | ICD-10-CM

## 2021-09-09 LAB — CUP PACEART REMOTE DEVICE CHECK
Date Time Interrogation Session: 20230104231254
Implantable Pulse Generator Implant Date: 20220214

## 2021-09-20 NOTE — Progress Notes (Signed)
Carelink Summary Report / Loop Recorder 

## 2021-09-22 ENCOUNTER — Telehealth: Payer: Self-pay | Admitting: Cardiovascular Disease

## 2021-09-22 NOTE — Telephone Encounter (Signed)
Spoke with pt regarding feeling like she was going to pass out on Sunday. Pt felt fine early Saturday morning but when she was trying to get ready for church she felt faint at least 4 times in a short amount of time. These episodes were accompanied by a heaviness in her chest. Pt states that she took her blood pressure twice during these episodes with reading 97/85, 94/63, with heartrate 65bpm. Pt states that her cuff said she was in a regular rhythm and she did not feel like she was in a-fib. Pt states that she could have been dehydrated and she had not eaten when the episodes occurred. Pt states she took her medication as prescribed and the way she usually does. Pt says that she would lay down for a few minutes until she felt better and could continue trying to get dressed. Pt did drink some water during these episodes. Pt states that she has not felt like this since Sunday and was fine for the remainder of the day. Will send to provider to advise further.

## 2021-09-22 NOTE — Telephone Encounter (Signed)
Per Dr. Gwenlyn Found,  [name], MD  You    If she has recurrent episodes, she will need to return office visit with either me or an APP to evaluate    Spoke with pt regarding Dr. Kennon Holter recommendations. Pt verbalizes understanding.

## 2021-09-22 NOTE — Telephone Encounter (Signed)
Pt c/o Syncope: STAT if syncope occurred within 30 minutes and pt complains of lightheadedness High Priority if episode of passing out, completely, today or in last 24 hours   Did you pass out today? No-    When is the last time you passed out? Had several episodes on Sunday- felt like she was going to pass out   Has this occurred multiple times? yes   Did you have any symptoms prior to passing out? Heaviness in chest, irregular heart beat all day, blood pressure dropped  97/85, 94/63- all this happen on Sunday- no episodes since Sunday

## 2021-10-06 ENCOUNTER — Other Ambulatory Visit (HOSPITAL_COMMUNITY): Payer: Self-pay | Admitting: Obstetrics & Gynecology

## 2021-10-06 ENCOUNTER — Other Ambulatory Visit: Payer: Self-pay | Admitting: Obstetrics & Gynecology

## 2021-10-06 DIAGNOSIS — N950 Postmenopausal bleeding: Secondary | ICD-10-CM

## 2021-10-11 ENCOUNTER — Ambulatory Visit (INDEPENDENT_AMBULATORY_CARE_PROVIDER_SITE_OTHER): Payer: PPO

## 2021-10-11 DIAGNOSIS — I639 Cerebral infarction, unspecified: Secondary | ICD-10-CM | POA: Diagnosis not present

## 2021-10-12 LAB — CUP PACEART REMOTE DEVICE CHECK
Date Time Interrogation Session: 20230207073658
Implantable Pulse Generator Implant Date: 20220214

## 2021-10-13 ENCOUNTER — Other Ambulatory Visit: Payer: Self-pay

## 2021-10-13 ENCOUNTER — Ambulatory Visit (HOSPITAL_COMMUNITY)
Admission: RE | Admit: 2021-10-13 | Discharge: 2021-10-13 | Disposition: A | Discharge status: Home / Self Care | Payer: PPO | Source: Ambulatory Visit | Attending: Obstetrics & Gynecology | Admitting: Obstetrics & Gynecology

## 2021-10-13 DIAGNOSIS — N950 Postmenopausal bleeding: Secondary | ICD-10-CM | POA: Insufficient documentation

## 2021-10-13 DIAGNOSIS — D252 Subserosal leiomyoma of uterus: Secondary | ICD-10-CM | POA: Diagnosis not present

## 2021-10-13 NOTE — Progress Notes (Signed)
Carelink Summary Report / Loop Recorder 

## 2021-10-16 DIAGNOSIS — L03114 Cellulitis of left upper limb: Secondary | ICD-10-CM | POA: Diagnosis not present

## 2021-10-18 ENCOUNTER — Encounter: Payer: Self-pay | Admitting: Physician Assistant

## 2021-10-18 ENCOUNTER — Other Ambulatory Visit: Payer: Self-pay

## 2021-10-18 ENCOUNTER — Other Ambulatory Visit: Payer: Self-pay | Admitting: Physician Assistant

## 2021-10-18 ENCOUNTER — Ambulatory Visit (INDEPENDENT_AMBULATORY_CARE_PROVIDER_SITE_OTHER): Payer: PPO | Admitting: Physician Assistant

## 2021-10-18 VITALS — BP 94/62 | HR 63 | Temp 97.8°F | Ht 61.0 in | Wt 194.0 lb

## 2021-10-18 DIAGNOSIS — L02519 Cutaneous abscess of unspecified hand: Secondary | ICD-10-CM | POA: Diagnosis not present

## 2021-10-18 DIAGNOSIS — L03119 Cellulitis of unspecified part of limb: Secondary | ICD-10-CM

## 2021-10-18 MED ORDER — PREDNISONE 20 MG PO TABS: ORAL_TABLET | ORAL | 0 refills | Status: AC

## 2021-10-18 MED ORDER — CEFTRIAXONE SODIUM 1 G IJ SOLR
1.0000 g | Freq: Once | INTRAMUSCULAR | Status: AC
  Administered 2021-10-18: 1 g via INTRAMUSCULAR

## 2021-10-18 NOTE — Progress Notes (Signed)
Acute Office Visit  Subjective:    Patient ID: Tonya Graves, female    DOB: 1951/04/20, 71 y.o.   MRN: [id number]  Chief Complaint  Patient presents with   Left middle finger infected    HPI: Patient is in today for complaints of left middle finger infected - states it started as a small blister and then has gotten moderately inflamed with swelling into her hand Denies malaise or fever Pt states she went to Urgent Care on Saturday and she was given Bactrim - she has had 4 doses of this med and states that her hand is looking better and the swelling has improved  Past Medical History:  Diagnosis Date   Anemia YEARS AGO   Arthritis    OA   Bronchitis    Cancer ([place]) Jun 11, 2015   ORAL CANCER, 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    Past Surgical History:  Procedure Laterality Date    C SECTIONS     X 2   ORAL SURGERY FOR MOUTH CANCER  06-11-2015   TENDON REPAIR Right  YEARS AGO   LITTLE FINGER   TOTAL KNEE ARTHROPLASTY Right 07/13/2015   Procedure: RIGHT TOTAL KNEE ARTHROPLASTY;  Surgeon: Gaynelle Arabian, MD;  Location: [facility];  Service: Orthopedics;  Laterality: Right;   TOTAL KNEE ARTHROPLASTY Left 06/06/2016   Procedure: LEFT TOTAL KNEE ARTHROPLASTY;  Surgeon: Gaynelle Arabian, MD;  Location: [facility];  Service: Orthopedics;  Laterality: Left;    Family History  Problem Relation Age of Onset   High blood pressure Mother    Atrial fibrillation Mother    Stroke Father    Heart disease Father    High Cholesterol Father    Atrial fibrillation Sister    Cancer Maternal Grandmother    Leukemia Maternal Grandmother     Other Maternal Grandfather        rocky [place] spotted fever   Stroke Paternal Grandmother     Social History   Socioeconomic History   Marital status: Married    Spouse name: Not on file   Number of children: Not on file   Years of education: Not on file   Highest education level: Not on file  Occupational History   Not on file  Tobacco Use   Smoking status: Never   Smokeless tobacco: Never  Vaping Use   Vaping Use: Never used  Substance and Sexual Activity   Alcohol use: No   Drug use: No   Sexual activity: Not on file  Other Topics Concern   Not on file  Social History Narrative   Not on file   Social Determinants of Health   Financial Resource Strain: Low Risk    Difficulty of Paying Living Expenses: Not hard at all  Food Insecurity: No Food Insecurity   Worried About Charity fundraiser in the Last Year: Never true   [place] in the Last Year: Never true  Transportation Needs: No Transportation Needs   Lack of Transportation (Medical): No  Lack of Transportation (Non-Medical): No  Physical Activity: Inactive   Days of Exercise per Week: 0 days   Minutes of Exercise per Session: 0 min  Stress: Stress Concern Present   Feeling of Stress : Rather much  Social Connections: Unknown   Frequency of Communication with Friends and Family: More than three times a week   Frequency of Social Gatherings with Friends and Family: More than three times a week   Attends Religious Services: Not on Electrical engineer or Organizations: Yes   Attends Music therapist: More than 4 times per year   Marital Status: Married  Human resources officer Violence: Not At Risk   Fear of Current or Ex-Partner: No   Emotionally Abused: No   Physically Abused: No   Sexually Abused: No    Outpatient Medications Prior to Visit  Medication Sig Dispense Refill   apixaban (ELIQUIS) 5 MG TABS tablet TAKE 1 TABLET BY MOUTH TWICE A DAY 180 tablet 1   atorvastatin  (LIPITOR) 80 MG tablet TAKE 1 TABLET BY MOUTH EVERY DAY 90 tablet 1   Calcium Carbonate-Vitamin D (CALCIUM 500 + D PO) Take 1 tablet by mouth 2 (two) times daily.     cyclobenzaprine (FLEXERIL) 5 MG tablet Take 1 tablet (5 mg total) by mouth at bedtime. 30 tablet 1   metoprolol tartrate (LOPRESSOR) 25 MG tablet Take 1 tablet (25 mg total) by mouth 2 (two) times daily. 180 tablet 1   Multiple Vitamin (MULTIVITAMIN ADULT PO) Take by mouth.     Propylene Glycol (SYSTANE COMPLETE OP) Apply to eye.     sulfamethoxazole-trimethoprim (BACTRIM DS) 800-160 MG tablet Take 1 tablet by mouth 2 (two) times daily.     Vitamin D, Ergocalciferol, (DRISDOL) 1.25 MG (50000 UNIT) CAPS capsule TAKE 1 CAPSULE BY MOUTH ONE TIME PER WEEK 12 capsule 5   pantoprazole (PROTONIX) 40 MG tablet Take 1 tablet (40 mg total) by mouth daily. 90 tablet 3   Facility-Administered Medications Prior to Visit  Medication Dose Route Frequency Provider Last Rate Last Admin   lidocaine-EPINEPHrine (XYLOCAINE W/EPI) 1 %-1:100000 (with pres) injection 10 mL  10 mL Infiltration Once Croitoru, Mihai, MD        No Known Allergies  Review of Systems CONSTITUTIONAL: Negative for chills, fatigue, fever, unintentional weight gain and unintentional weight loss.  CARDIOVASCULAR: Negative for chest pain, dizziness, palpitations and pedal edema.  RESPIRATORY: Negative for recent cough and dyspnea.  INTEGUMENTARY: see HPI         Objective:  PHYSICAL EXAM:   VS: BP 94/62 (BP Location: Left Arm, Patient Position: Sitting)    Pulse 63    Temp 97.8 F (36.6 C) (Oral)    Ht _0  (1.549 m)    Wt 194 lb (88 kg)    SpO2 100%    BMI 36.66 kg/m   GEN: Well nourished, well developed, in no acute distress  Cardiac: RRR; no murmurs, rubs, or gallops,no edema -  Respiratory:  normal respiratory rate and pattern with no distress - normal breath sounds with no rales, rhonchi, wheezes or rubs Skin: left middle finger with cellulitis noted and small  blister -- erythema extending into hand   Health Maintenance Due  Topic Date Due   Zoster Vaccines- Shingrix (1 of 2) Never done    There are no preventive care reminders to display for this patient.   Lab Results  Component Value Date   TSH 1.650 05/17/2021   Lab  Results  Component Value Date   WBC 4.4 05/17/2021   HGB 11.8 05/17/2021   HCT 34.7 05/17/2021   MCV 85 05/17/2021   PLT 221 05/17/2021   Lab Results  Component Value Date   NA 144 05/17/2021   K 4.8 05/17/2021   CO2 24 05/17/2021   GLUCOSE 105 (H) 05/17/2021   BUN 18 05/17/2021   CREATININE 0.81 05/17/2021   BILITOT 0.2 05/17/2021   ALKPHOS 91 05/17/2021   AST 31 05/17/2021   ALT 25 05/17/2021   PROT 6.3 05/17/2021   ALBUMIN 4.0 05/17/2021   CALCIUM 9.6 05/17/2021   ANIONGAP 5 06/08/2016   EGFR 79 05/17/2021   Lab Results  Component Value Date   CHOL 168 05/17/2021   Lab Results  Component Value Date   HDL 101 05/17/2021   Lab Results  Component Value Date   LDLCALC 58 05/17/2021   Lab Results  Component Value Date   TRIG 39 05/17/2021   Lab Results  Component Value Date   CHOLHDL 1.7 05/17/2021   No results found for: HGBA1C     Assessment & Plan:   Problem List Items Addressed This Visit   None Visit Diagnoses     Cellulitis and abscess of hand    -  Primary - possible spider bite   Relevant Medications   cefTRIAXone (ROCEPHIN) injection 1 g Prednisone as directed Continue septra Wound culture pending      Meds ordered this encounter  Medications   cefTRIAXone (ROCEPHIN) injection 1 g   predniSONE (DELTASONE) 20 MG tablet    Sig: Take 3 tablets (60 mg total) by mouth daily with breakfast for 3 days, THEN 2 tablets (40 mg total) daily with breakfast for 3 days, THEN 1 tablet (20 mg total) daily with breakfast for 3 days.    Dispense:  18 tablet    Refill:  0    Order Specific Question:   Supervising Provider    AnswerShelton Silvas    Orders Placed This  Encounter  Procedures   Anaerobic and Aerobic Culture     Follow-up: Return if symptoms worsen or fail to improve.  An After Visit Summary was printed and given to the patient.  Yetta Flock [facility] [phone]

## 2021-10-20 ENCOUNTER — Other Ambulatory Visit: Payer: Self-pay | Admitting: Physician Assistant

## 2021-10-21 DIAGNOSIS — K219 Gastro-esophageal reflux disease without esophagitis: Secondary | ICD-10-CM | POA: Diagnosis not present

## 2021-10-21 DIAGNOSIS — K449 Diaphragmatic hernia without obstruction or gangrene: Secondary | ICD-10-CM | POA: Diagnosis not present

## 2021-10-22 ENCOUNTER — Telehealth: Payer: Self-pay

## 2021-10-22 NOTE — Telephone Encounter (Signed)
Please provide recommendations on holding Eliquis.

## 2021-10-22 NOTE — Telephone Encounter (Signed)
Patient with diagnosis of afib on Eliquis for anticoagulation.    Procedure: robotic hiatal hernia repair with fundoplication Date of procedure: TBD  CHA2DS2-VASc Score = 4  This indicates a 4.8% annual risk of stroke. The patient's score is based upon: CHF History: 0 HTN History: 0 Diabetes History: 0 Stroke History: 2 Vascular Disease History: 0 Age Score: 1 Gender Score: 1  CVA 07/10/20, ILR placed 10/19/20, detected afib, pt started on Eliquis. Subsequent TIA 05/2021 while on Eliquis.  CrCl 104mL/min using adjusted body weight Platelet count 221K  Would see if MD is ok with 1 day Eliquis hold given pt's afib with recurrent CVA. If 2 day hold is required, would forward to MD for input.

## 2021-10-22 NOTE — Telephone Encounter (Signed)
° °  Pre-operative Risk Assessment    Patient Name: Tonya Graves  DOB: 1951-07-23 MRN: [id number]      Request for Surgical Clearance    Procedure:   Robiotic Hiatal Hernia Repair with Fundoplication  Date of Surgery:  Clearance TBD                                 Surgeon:  Johnathan Hausen, MD  Surgeon's Group or Practice Name:  [facility] Phone number:  [phone] Fax number:  [phone]   Type of Clearance Requested:   - Medical  - Pharmacy:  Hold Apixaban (Eliquis) instructions as to how patient should hold    Type of Anesthesia:  General    Additional requests/questions:   Please call office if patient needs further medical workup before clearance can be given  Signed, Jacqulynn Cadet   10/22/2021, 3:20 PM

## 2021-10-25 ENCOUNTER — Other Ambulatory Visit: Payer: Self-pay | Admitting: Physician Assistant

## 2021-10-25 NOTE — Telephone Encounter (Signed)
° °  Name: Tonya Graves  DOB: 05-31-1951  MRN: [id number]   Primary Cardiologist: Quay Burow, MD  Chart reviewed as part of pre-operative protocol coverage. Patient was contacted 10/25/2021 in reference to pre-operative risk assessment for pending surgery as outlined below.  Tonya Graves was last seen on 06/14/21 by Coletta Memos, NP.  Since that day, Tonya Graves has done well.  She does not have a history of ischemic heart disease, MI, or PCI. She does have a history of recurrent CVA, which complicates Tonya Graves hold request.   Per our clinical pharmacist: Patient with diagnosis of afib on Eliquis for anticoagulation.     Procedure: robotic hiatal hernia repair with fundoplication Date of procedure: TBD   CHA2DS2-VASc Score = 4  This indicates a 4.8% annual risk of stroke. The patient's score is based upon: CHF History: 0 HTN History: 0 Diabetes History: 0 Stroke History: 2 Vascular Disease History: 0 Age Score: 1 Gender Score: 1   CVA 07/10/20, ILR placed 10/19/20, detected afib, pt started on Eliquis. Subsequent TIA 05/2021 while on Eliquis.   CrCl 37mL/min using adjusted body weight Platelet count 221K   Would see if surgeon is ok with 1 day Eliquis hold given pt's afib with recurrent CVA. If 2 day hold is required, would forward to cardiologist for input.   Therefore, based on ACC/AHA guidelines, the patient would be at acceptable risk for the planned procedure without further cardiovascular testing.   The patient was advised that if she develops new symptoms prior to surgery to contact our office to arrange for a follow-up visit, and she verbalized understanding.  I will route this recommendation to the requesting party via Epic fax function and remove from pre-op pool. Please call with questions.  [name], PA 10/25/2021, 10:19 AM

## 2021-10-26 ENCOUNTER — Other Ambulatory Visit: Payer: Self-pay | Admitting: Physician Assistant

## 2021-10-26 MED ORDER — SULFAMETHOXAZOLE-TRIMETHOPRIM 800-160 MG PO TABS: 1.0000 | ORAL_TABLET | Freq: Two times a day (BID) | ORAL | 0 refills | Status: DC

## 2021-10-26 MED ORDER — DOXYCYCLINE HYCLATE 100 MG PO TABS: 100.0000 mg | ORAL_TABLET | Freq: Two times a day (BID) | ORAL | 0 refills | Status: DC

## 2021-10-27 ENCOUNTER — Telehealth: Payer: Self-pay

## 2021-10-27 LAB — ANAEROBIC AND AEROBIC CULTURE

## 2021-10-27 NOTE — Telephone Encounter (Signed)
Report from [facility] for Anaerobic and aerobic Urine culture came back with Staphylococcus aureus. Tonya Graves will send in a prescription for Doxycycline. Tonya Graves. Gave the results to patient on 10/26/2021. Patient did not have any concerns or questions at this time.

## 2021-11-03 ENCOUNTER — Encounter: Payer: Self-pay | Admitting: Cardiovascular Disease

## 2021-11-03 ENCOUNTER — Ambulatory Visit: Payer: PPO | Admitting: Cardiovascular Disease

## 2021-11-03 ENCOUNTER — Other Ambulatory Visit: Payer: Self-pay

## 2021-11-03 VITALS — BP 130/74 | HR 55 | Ht 61.0 in | Wt 194.8 lb

## 2021-11-03 DIAGNOSIS — I4729 Other ventricular tachycardia: Secondary | ICD-10-CM

## 2021-11-03 DIAGNOSIS — R0609 Other forms of dyspnea: Secondary | ICD-10-CM | POA: Diagnosis not present

## 2021-11-03 DIAGNOSIS — E782 Mixed hyperlipidemia: Secondary | ICD-10-CM

## 2021-11-03 DIAGNOSIS — R002 Palpitations: Secondary | ICD-10-CM | POA: Diagnosis not present

## 2021-11-03 NOTE — Assessment & Plan Note (Signed)
History of hyperlipidemia on high-dose statin therapy with lipid profile performed 05/17/2021 revealing a total cholesterol of 168, LDL 58 and HDL 101. ?

## 2021-11-03 NOTE — Progress Notes (Signed)
? ? ? ?11/03/2021 ?Tonya Graves   ?[birth year]  ?[id number] ? ?Primary Physician [name], PA-C ?Primary Cardiologist: [name] MD [name], [place] ? ?HPI:  Tonya Graves is a 71 y.o.  mildly overweight married Caucasian female mother 2, grandmother and 3 grandchildren referred to me by Dr. Tobie Poet for cardiology evaluation because of palpitations.  I last saw her in the office  12/08/2020.  She has seen Dr. Bettina Gavia remotely for similar symptoms that occur during stressful times in her life and was prescribed when necessary metoprolol. She has no cardiac risk factors. She's never had a heart attack or stroke. She has occasional atypical chest pain with does not sound cardiac. Apparently her mother-in-law moved in with them towards the end of December 2018 which caused a lot of stress at that time.  Unfortunately, she has since passed away Dec 09, 2022 of last year and her mother has subsequent passed away as well.  I did perform an event monitor which showed sinus rhythm with PACs and short atrial runs and a 2D echo that was entirely normal.   ?  ?She had a stroke on 07/10/2020 and was treated at [facility].  Work-up was unrevealing including 2D echo in 2-week event monitor.  She did have some palpitations in January.    Dr. Sallyanne Kuster implanted a loop recorder at my request on 10/19/2020. ?  ?Since I saw her in the office a year ago she has done well.  Her loop recorder recently did show episode of tachycardia that was regular at a rate of 200-222 which could have been atrial tachycardia, a flutter and/or ventricular tachycardia.  She has had short episodes of PAF currently in sinus rhythm on Eliquis oral anticoagulation maintaining sinus rhythm.  She is scheduled for paraesophageal hernia repair in the near future.  I told her that she can stop her Eliquis 3 days before. ? ? ?Current Meds  ?Medication Sig  ? apixaban (ELIQUIS) 5 MG TABS tablet TAKE 1 TABLET BY MOUTH TWICE A DAY  ? atorvastatin (LIPITOR) 80  MG tablet TAKE 1 TABLET BY MOUTH EVERY DAY  ? Calcium Carbonate-Vitamin D (CALCIUM 500 + D PO) Take 1 tablet by mouth 2 (two) times daily.  ? cyclobenzaprine (FLEXERIL) 5 MG tablet Take 1 tablet (5 mg total) by mouth at bedtime.  ? doxycycline (VIBRA-TABS) 100 MG tablet Take 1 tablet (100 mg total) by mouth 2 (two) times daily.  ? metoprolol tartrate (LOPRESSOR) 25 MG tablet TAKE 1 TABLET (25 MG TOTAL) BY MOUTH AS NEEDED (FOR PALPITATIONS).  ? Multiple Vitamin (MULTIVITAMIN ADULT PO) Take by mouth.  ? pantoprazole (PROTONIX) 40 MG tablet TAKE 1 TABLET BY MOUTH EVERY DAY  ? Propylene Glycol (SYSTANE COMPLETE OP) Apply to eye.  ? Vitamin D, Ergocalciferol, (DRISDOL) 1.25 MG (50000 UNIT) CAPS capsule TAKE 1 CAPSULE BY MOUTH ONE TIME PER WEEK  ?  ? ?No Known Allergies ? ?Social History  ? ?Socioeconomic History  ? Marital status: Married  ?  Spouse name: Not on file  ? Number of children: Not on file  ? Years of education: Not on file  ? Highest education level: Not on file  ?Occupational History  ? Not on file  ?Tobacco Use  ? Smoking status: Never  ? Smokeless tobacco: Never  ?Vaping Use  ? Vaping Use: Never used  ?Substance and Sexual Activity  ? Alcohol use: No  ? Drug use: No  ? Sexual activity: Not on file  ?Other  Topics Concern  ? Not on file  ?Social History Narrative  ? Not on file  ? ?Social Determinants of Health  ? ?Financial Resource Strain: Low Risk   ? Difficulty of Paying Living Expenses: Not hard at all  ?Food Insecurity: No Food Insecurity  ? Worried About Charity fundraiser in the Last Year: Never true  ? Ran Out of Food in the Last Year: Never true  ?Transportation Needs: No Transportation Needs  ? Lack of Transportation (Medical): No  ? Lack of Transportation (Non-Medical): No  ?Physical Activity: Inactive  ? Days of Exercise per Week: 0 days  ? Minutes of Exercise per Session: 0 min  ?Stress: Stress Concern Present  ? Feeling of Stress : Rather much  ?Social Connections: Unknown  ? Frequency of  Communication with Friends and Family: More than three times a week  ? Frequency of Social Gatherings with Friends and Family: More than three times a week  ? Attends Religious Services: Not on file  ? Active Member of Clubs or Organizations: Yes  ? Attends Archivist Meetings: More than 4 times per year  ? Marital Status: Married  ?Intimate Partner Violence: Not At Risk  ? Fear of Current or Ex-Partner: No  ? Emotionally Abused: No  ? Physically Abused: No  ? Sexually Abused: No  ?  ? ?Review of Systems: ?General: negative for chills, fever, night sweats or weight changes.  ?Cardiovascular: negative for chest pain, dyspnea on exertion, edema, orthopnea, palpitations, paroxysmal nocturnal dyspnea or shortness of breath ?Dermatological: negative for rash ?Respiratory: negative for cough or wheezing ?Urologic: negative for hematuria ?Abdominal: negative for nausea, vomiting, diarrhea, bright red blood per rectum, melena, or hematemesis ?Neurologic: negative for visual changes, syncope, or dizziness ?All other systems reviewed and are otherwise negative except as noted above. ? ? ? ?Blood pressure 130/74, pulse (!) 55, height 5\' 1"  (1.549 m), weight 194 lb 12.8 oz (88.4 kg), SpO2 94 %.  ?General appearance: alert and no distress ?Neck: no adenopathy, no carotid bruit, no JVD, supple, symmetrical, trachea midline, and thyroid not enlarged, symmetric, no tenderness/mass/nodules ?Lungs: clear to auscultation bilaterally ?Heart: regular rate and rhythm, S1, S2 normal, no murmur, click, rub or gallop ?Extremities: extremities normal, atraumatic, no cyanosis or edema ?Pulses: 2+ and symmetric ?Skin: Skin color, texture, turgor normal. No rashes or lesions ?Neurologic: Grossly normal ? ?EKG sinus bradycardia 55 with left axis deviation and minimal voltage criteria for LVH.  I personally reviewed this EKG. ? ?ASSESSMENT AND PLAN:  ? ?Palpitations ?History of palpitations with loop recorder showing episodes of PAF  currently on Eliquis oral anticoagulation.  Her most recent loop recorder showed a fast tachycardia that appeared regular at a rate of 200-222.  This was either atrial tachycardia, a flutter with one-to-one conduction or ventricular tachycardia.  The QRS was wide.  She had episodes of dizziness and presyncope as well.  I am going refer her to one of our electrophysiologist to review and make further recommendations. ? ?Dyspnea on exertion ?History of dyspnea on exertion with an essentially normal 2D echocardiogram.  I am going to get a coronary calcium score to further evaluate. ? ?Mixed hyperlipidemia ?History of hyperlipidemia on high-dose statin therapy with lipid profile performed 05/17/2021 revealing a total cholesterol of 168, LDL 58 and HDL 101. ? ? ? ? ?[name] MD FACP,FACC,FAHA, FSCAI ?11/03/2021 ?3:02 PM ?

## 2021-11-03 NOTE — Assessment & Plan Note (Signed)
History of dyspnea on exertion with an essentially normal 2D echocardiogram.  I am going to get a coronary calcium score to further evaluate. ?

## 2021-11-03 NOTE — Patient Instructions (Signed)
Medication Instructions:  °Your physician recommends that you continue on your current medications as directed. Please refer to the Current Medication list given to you today. ° °*If you need a refill on your cardiac medications before your next appointment, please call your pharmacy* ° ° °Testing/Procedures: °Dr. Berry has ordered a CT coronary calcium score.  ° °Test location:  °[facility] ([address] [place], [place] [address]) ° °This is $99 out of pocket. ° ° °Coronary CalciumScan °A coronary calcium scan is an imaging test used to look for deposits of calcium and other fatty materials (plaques) in the inner lining of the blood vessels of the heart (coronary arteries). These deposits of calcium and plaques can partly clog and narrow the coronary arteries without producing any symptoms or warning signs. This puts a person at risk for a heart attack. This test can detect these deposits before symptoms develop. °Tell a health care provider about: °Any allergies you have. °All medicines you are taking, including vitamins, herbs, eye drops, creams, and over-the-counter medicines. °Any problems you or family members have had with anesthetic medicines. °Any blood disorders you have. °Any surgeries you have had. °Any medical conditions you have. °Whether you are pregnant or may be pregnant. °What are the risks? °Generally, this is a safe procedure. However, problems may occur, including: °Harm to a pregnant woman and her unborn baby. This test involves the use of radiation. Radiation exposure can be dangerous to a pregnant woman and her unborn baby. If you are pregnant, you generally should not have this procedure done. °Slight increase in the risk of cancer. This is because of the radiation involved in the test. °What happens before the procedure? °No preparation is needed for this procedure. °What happens during the procedure? °You will undress and remove any jewelry around your neck or chest. °You will  put on a hospital gown. °Sticky electrodes will be placed on your chest. The electrodes will be connected to an electrocardiogram (ECG) machine to record a tracing of the electrical activity of your heart. °A CT scanner will take pictures of your heart. During this time, you will be asked to lie still and hold your breath for 2-3 seconds while a picture of your heart is being taken. °The procedure may vary among health care providers and hospitals. °What happens after the procedure? °You can get dressed. °You can return to your normal activities. °It is up to you to get the results of your test. Ask your health care provider, or the department that is doing the test, when your results will be ready. °Summary °A coronary calcium scan is an imaging test used to look for deposits of calcium and other fatty materials (plaques) in the inner lining of the blood vessels of the heart (coronary arteries). °Generally, this is a safe procedure. Tell your health care provider if you are pregnant or may be pregnant. °No preparation is needed for this procedure. °A CT scanner will take pictures of your heart. °You can return to your normal activities after the scan is done. °This information is not intended to replace advice given to you by your health care provider. Make sure you discuss any questions you have with your health care provider. °Document Released: 02/18/2008 Document Revised: 07/11/2016 Document Reviewed: 07/11/2016 °Elsevier Interactive Patient Education © 2017 Elsevier Inc. ° ° ° °Follow-Up: °At [facility], you and your health needs are our priority.  As part of our continuing mission to provide you with exceptional heart care,   we have created designated Provider Care Teams.  These Care Teams include your primary Cardiologist (physician) and Advanced Practice Providers (APPs -  Physician Assistants and Nurse Practitioners) who all work together to provide you with the care you need, when you need it. ° °We  recommend signing up for the patient portal called "MyChart".  Sign up information is provided on this After Visit Summary.  MyChart is used to connect with patients for Virtual Visits (Telemedicine).  Patients are able to view lab/test results, encounter notes, upcoming appointments, etc.  Non-urgent messages can be sent to your provider as well.   °To learn more about what you can do with MyChart, go to [email or web address].   ° °Your next appointment:   °12 month(s) ° °The format for your next appointment:   °In Person ° °Provider:   °Jonathan Berry, MD °

## 2021-11-03 NOTE — Assessment & Plan Note (Signed)
History of palpitations with loop recorder showing episodes of PAF currently on Eliquis oral anticoagulation.  Her most recent loop recorder showed a fast tachycardia that appeared regular at a rate of 200-222.  This was either atrial tachycardia, a flutter with one-to-one conduction or ventricular tachycardia.  The QRS was wide.  She had episodes of dizziness and presyncope as well.  I am going refer her to one of our electrophysiologist to review and make further recommendations. ?

## 2021-11-10 DIAGNOSIS — H2513 Age-related nuclear cataract, bilateral: Secondary | ICD-10-CM | POA: Diagnosis not present

## 2021-11-10 DIAGNOSIS — H53461 Homonymous bilateral field defects, right side: Secondary | ICD-10-CM | POA: Diagnosis not present

## 2021-11-11 ENCOUNTER — Ambulatory Visit (INDEPENDENT_AMBULATORY_CARE_PROVIDER_SITE_OTHER): Payer: PPO

## 2021-11-11 DIAGNOSIS — I4729 Other ventricular tachycardia: Secondary | ICD-10-CM

## 2021-11-13 LAB — CUP PACEART REMOTE DEVICE CHECK
Date Time Interrogation Session: 20230310230627
Implantable Pulse Generator Implant Date: 20220214

## 2021-11-15 ENCOUNTER — Ambulatory Visit (INDEPENDENT_AMBULATORY_CARE_PROVIDER_SITE_OTHER): Payer: PPO | Admitting: Physician Assistant

## 2021-11-15 ENCOUNTER — Other Ambulatory Visit: Payer: Self-pay

## 2021-11-15 ENCOUNTER — Encounter: Payer: Self-pay | Admitting: Physician Assistant

## 2021-11-15 VITALS — BP 114/82 | HR 60 | Temp 97.7°F | Ht 61.0 in | Wt 194.4 lb

## 2021-11-15 DIAGNOSIS — E559 Vitamin D deficiency, unspecified: Secondary | ICD-10-CM | POA: Diagnosis not present

## 2021-11-15 DIAGNOSIS — I480 Paroxysmal atrial fibrillation: Secondary | ICD-10-CM | POA: Diagnosis not present

## 2021-11-15 DIAGNOSIS — K449 Diaphragmatic hernia without obstruction or gangrene: Secondary | ICD-10-CM

## 2021-11-15 DIAGNOSIS — I4729 Other ventricular tachycardia: Secondary | ICD-10-CM | POA: Diagnosis not present

## 2021-11-15 DIAGNOSIS — E782 Mixed hyperlipidemia: Secondary | ICD-10-CM

## 2021-11-15 MED ORDER — CYCLOBENZAPRINE HCL 5 MG PO TABS
5.0000 mg | ORAL_TABLET | Freq: Every day | ORAL | 1 refills | Status: DC
Start: 2021-11-15 — End: 2022-11-22

## 2021-11-15 NOTE — Progress Notes (Signed)
Subjective:  Patient ID: Tonya Graves, female    DOB: 11/05/1950  Age: 71 y.o. MRN: [id number]  Chief Complaint  Patient presents with   Hyperlipidemia    HPI  Mixed hyperlipidemia  Pt presents with hyperlipidemia.Compliance with treatment has been good The patient is compliant with medications, maintains a low cholesterol diet , follows up as directed , and maintains an exercise regimen . The patient denies experiencing any hypercholesterolemia related symptoms. She is currently on lipitor '80mg'$  qd  Pt with history of afib and vtach - she follows with Dr Gwenlyn Found - had appt with him on 11/03/21 because of a reading she had on her loop recorder.  States she will be having a coronary CT on 4/17 and then seeing Dr Quentin Ore (electrophysiologist) on 4/21 She is currently on eliquis and lopressor She denies any problems today - no chest pain or dyspnea  Pt with history of periesophageal hernia - is following with Dr Hassell Done.  Last visit was 10/21/21 - she is going to be scheduled for repair Currently on protonix '40mg'$   Pt with history of vit D def - due for labwork - is taking supplements Current Outpatient Medications on File Prior to Visit  Medication Sig Dispense Refill   apixaban (ELIQUIS) 5 MG TABS tablet TAKE 1 TABLET BY MOUTH TWICE A DAY 180 tablet 1   atorvastatin (LIPITOR) 80 MG tablet TAKE 1 TABLET BY MOUTH EVERY DAY 90 tablet 1   Calcium Carbonate-Vitamin D (CALCIUM 500 + D PO) Take 1 tablet by mouth 2 (two) times daily.     metoprolol tartrate (LOPRESSOR) 25 MG tablet TAKE 1 TABLET (25 MG TOTAL) BY MOUTH AS NEEDED (FOR PALPITATIONS). 90 tablet 1   Multiple Vitamin (MULTIVITAMIN ADULT PO) Take by mouth.     pantoprazole (PROTONIX) 40 MG tablet TAKE 1 TABLET BY MOUTH EVERY DAY 90 tablet 3   Propylene Glycol (SYSTANE COMPLETE OP) Apply to eye.     Vitamin D, Ergocalciferol, (DRISDOL) 1.25 MG (50000 UNIT) CAPS capsule TAKE 1 CAPSULE BY MOUTH ONE TIME PER WEEK 12 capsule 5   No current  facility-administered medications on file prior to visit.   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   Past Surgical History:  Procedure Laterality Date    C SECTIONS     X 2   ORAL SURGERY FOR MOUTH CANCER  06-11-2015   TENDON REPAIR Right  YEARS AGO   LITTLE FINGER   TOTAL KNEE ARTHROPLASTY Right 07/13/2015   Procedure: RIGHT TOTAL KNEE ARTHROPLASTY;  Surgeon: Gaynelle Arabian, MD;  Location: [facility];  Service: Orthopedics;  Laterality: Right;   TOTAL KNEE ARTHROPLASTY Left 06/06/2016   Procedure: LEFT TOTAL KNEE ARTHROPLASTY;  Surgeon: Gaynelle Arabian, MD;  Location: [facility];  Service: Orthopedics;  Laterality: Left;    Family History  Problem Relation Age of Onset  High blood pressure Mother    Atrial fibrillation Mother    Stroke Father    Heart disease Father    High Cholesterol Father    Atrial fibrillation Sister    Cancer Maternal Grandmother    Leukemia Maternal Grandmother    Other Maternal Grandfather        [place] spotted fever   Stroke Paternal Grandmother    Social History   Socioeconomic History   Marital status: Married    Spouse name: Not on file   Number of children: Not on file   Years of education: Not on file   Highest education level: Not on file  Occupational History   Not on file  Tobacco Use   Smoking status: Never   Smokeless tobacco: Never  Vaping Use   Vaping Use: Never used  Substance and Sexual Activity   Alcohol use: No   Drug use: No   Sexual  activity: Not on file  Other Topics Concern   Not on file  Social History Narrative   Not on file   Social Determinants of Health   Financial Resource Strain: Low Risk    Difficulty of Paying Living Expenses: Not hard at all  Food Insecurity: No Food Insecurity   Worried About Charity fundraiser in the Last Year: Never true   [place] in the Last Year: Never true  Transportation Needs: No Transportation Needs   Lack of Transportation (Medical): No   Lack of Transportation (Non-Medical): No  Physical Activity: Inactive   Days of Exercise per Week: 0 days   Minutes of Exercise per Session: 0 min  Stress: Stress Concern Present   Feeling of Stress : Rather much  Social Connections: Unknown   Frequency of Communication with Friends and Family: More than three times a week   Frequency of Social Gatherings with Friends and Family: More than three times a week   Attends Religious Services: Not on Electrical engineer or Organizations: Yes   Attends Music therapist: More than 4 times per year   Marital Status: Married    Review of Systems  CONSTITUTIONAL: Negative for chills, fatigue, fever, unintentional weight gain and unintentional weight loss.  E/N/T: Negative for ear pain, nasal congestion and sore throat.  CARDIOVASCULAR: Negative for chest pain, dizziness, palpitations and pedal edema.  RESPIRATORY: Negative for recent cough and dyspnea.  GASTROINTESTINAL: Negative for abdominal pain, acid reflux symptoms, constipation, diarrhea, nausea and vomiting.  MSK: Negative for arthralgias and myalgias.  INTEGUMENTARY: Negative for rash.  NEUROLOGICAL: Negative for dizziness and headaches.  PSYCHIATRIC: Negative for sleep disturbance and to question depression screen.  Negative for depression, negative for anhedonia.      Objective:  PHYSICAL EXAM:   VS: BP 114/82    Pulse 60    Temp 97.7 F (36.5 C)    Ht '5\' 1"'$  (1.549 m)    Wt 194 lb 6.4 oz (88.2  kg)    SpO2 98%    BMI 36.73 kg/m   GEN: Well nourished, well developed, in no acute distress  Cardiac: RRR; no murmurs, rubs, or gallops,no edema -  Respiratory:  normal respiratory rate and pattern with no distress - normal breath sounds with no rales, rhonchi, wheezes or rubs MS: no deformity or atrophy  Skin: warm and dry, no rash  Psych: euthymic mood, appropriate affect and demeanor   Lab Results  Component Value Date   WBC 4.4 05/17/2021  HGB 11.8 05/17/2021   HCT 34.7 05/17/2021   PLT 221 05/17/2021   GLUCOSE 105 (H) 05/17/2021   CHOL 168 05/17/2021   TRIG 39 05/17/2021   HDL 101 05/17/2021   LDLCALC 58 05/17/2021   ALT 25 05/17/2021   AST 31 05/17/2021   NA 144 05/17/2021   K 4.8 05/17/2021   CL 107 (H) 05/17/2021   CREATININE 0.81 05/17/2021   BUN 18 05/17/2021   CO2 24 05/17/2021   TSH 1.650 05/17/2021   INR 1.03 05/27/2016      Assessment & Plan:   Problem List Items Addressed This Visit       Cardiovascular and Mediastinum   Ventricular tachycardia (paroxysmal)   Relevant Orders   CBC with Differential/Platelet   Comprehensive metabolic panel   TSH Continue meds and follow up with specialists as directed   PAF (paroxysmal atrial fibrillation) ([place]) - Primary     Other   Mixed hyperlipidemia   Relevant Orders   Lipid panel Continue meds Watch diet   Vitamin D insufficiency   Relevant Orders   VITAMIN D 25 Hydroxy (Vit-D Deficiency, Fractures)  Periesophageal hiatal hernia Continue follow up with Dr Hassell Done  .  Meds ordered this encounter  Medications   cyclobenzaprine (FLEXERIL) 5 MG tablet    Sig: Take 1 tablet (5 mg total) by mouth at bedtime.    Dispense:  30 tablet    Refill:  1    Order Specific Question:   Supervising Provider    AnswerShelton Silvas    Orders Placed This Encounter  Procedures   CBC with Differential/Platelet   Comprehensive metabolic panel   Lipid panel   VITAMIN D 25 Hydroxy (Vit-D  Deficiency, Fractures)   TSH     Follow-up: Return in about 6 months (around 05/18/2022) for chronic fasting follow up.  An After Visit Summary was printed and given to the patient.  Yetta Flock [facility] [phone]

## 2021-11-16 LAB — CBC WITH DIFFERENTIAL/PLATELET
Basophils Absolute: 0 10*3/uL (ref 0.0–0.2)
Basos: 1 %
EOS (ABSOLUTE): 0.1 10*3/uL (ref 0.0–0.4)
Eos: 2 %
Hematocrit: 35.3 % (ref 34.0–46.6)
Hemoglobin: 12.1 g/dL (ref 11.1–15.9)
Immature Grans (Abs): 0 10*3/uL (ref 0.0–0.1)
Immature Granulocytes: 1 %
Lymphocytes Absolute: 1.1 10*3/uL (ref 0.7–3.1)
Lymphs: 31 %
MCH: 29.4 pg (ref 26.6–33.0)
MCHC: 34.3 g/dL (ref 31.5–35.7)
MCV: 86 fL (ref 79–97)
Monocytes Absolute: 0.5 10*3/uL (ref 0.1–0.9)
Monocytes: 13 %
NRBC: 5 % — ABNORMAL HIGH (ref 0–0)
Neutrophils Absolute: 1.9 10*3/uL (ref 1.4–7.0)
Neutrophils: 52 %
Platelets: 221 10*3/uL (ref 150–450)
RBC: 4.12 x10E6/uL (ref 3.77–5.28)
RDW: 13.7 % (ref 11.7–15.4)
WBC: 3.5 10*3/uL (ref 3.4–10.8)

## 2021-11-16 LAB — COMPREHENSIVE METABOLIC PANEL
ALT: 16 IU/L (ref 0–32)
AST: 20 IU/L (ref 0–40)
Albumin/Globulin Ratio: 1.4 (ref 1.2–2.2)
Albumin: 3.6 g/dL — ABNORMAL LOW (ref 3.8–4.8)
Alkaline Phosphatase: 71 IU/L (ref 44–121)
BUN/Creatinine Ratio: 20 (ref 12–28)
BUN: 18 mg/dL (ref 8–27)
Bilirubin Total: 0.4 mg/dL (ref 0.0–1.2)
CO2: 26 mmol/L (ref 20–29)
Calcium: 9.2 mg/dL (ref 8.7–10.3)
Chloride: 105 mmol/L (ref 96–106)
Creatinine, Ser: 0.88 mg/dL (ref 0.57–1.00)
Globulin, Total: 2.5 g/dL (ref 1.5–4.5)
Glucose: 103 mg/dL — ABNORMAL HIGH (ref 70–99)
Potassium: 4.6 mmol/L (ref 3.5–5.2)
Sodium: 141 mmol/L (ref 134–144)
Total Protein: 6.1 g/dL (ref 6.0–8.5)
eGFR: 71 mL/min/{1.73_m2} (ref >59)

## 2021-11-16 LAB — TSH: TSH: 2.34 u[IU]/mL (ref 0.450–4.500)

## 2021-11-16 LAB — LIPID PANEL
Chol/HDL Ratio: 1.8 ratio (ref 0.0–4.4)
Cholesterol, Total: 170 mg/dL (ref 100–199)
HDL: 97 mg/dL (ref >39)
LDL Chol Calc (NIH): 61 mg/dL (ref 0–99)
Triglycerides: 64 mg/dL (ref 0–149)
VLDL Cholesterol Cal: 12 mg/dL (ref 5–40)

## 2021-11-16 LAB — VITAMIN D 25 HYDROXY (VIT D DEFICIENCY, FRACTURES): Vit D, 25-Hydroxy: 78 ng/mL (ref 30.0–100.0)

## 2021-11-16 LAB — CARDIOVASCULAR RISK ASSESSMENT

## 2021-11-22 DIAGNOSIS — Z1231 Encounter for screening mammogram for malignant neoplasm of breast: Secondary | ICD-10-CM | POA: Diagnosis not present

## 2021-11-22 LAB — HM MAMMOGRAPHY

## 2021-11-24 NOTE — Progress Notes (Signed)
Carelink Summary Report / Loop Recorder 

## 2021-12-13 ENCOUNTER — Ambulatory Visit (INDEPENDENT_AMBULATORY_CARE_PROVIDER_SITE_OTHER): Payer: PPO

## 2021-12-13 DIAGNOSIS — I4729 Other ventricular tachycardia: Secondary | ICD-10-CM | POA: Diagnosis not present

## 2021-12-14 ENCOUNTER — Encounter: Payer: Self-pay | Admitting: Cardiovascular Disease

## 2021-12-14 LAB — CUP PACEART REMOTE DEVICE CHECK
Date Time Interrogation Session: 20230411092116
Implantable Pulse Generator Implant Date: 20220214

## 2021-12-14 NOTE — Patient Instructions (Addendum)
2  VISITORS  ARE ALLOWED TO COME WITH YOU AND STAY IN THE WAITING ROOM ONLY DURING PRE OP AND PROCEDURE.   ?**NO VISITORS ARE ALLOWED IN THE SHORT STAY AREA OR RECOVERY ROOM!!** ? ?IF YOU WILL BE ADMITTED INTO THE HOSPITAL YOU ARE ALLOWED ONLY TWO SUPPORT PEOPLE DURING VISITATION HOURS ONLY (7 AM -8PM)   ?The support person(s) must pass our screening, gel in and out, and wear a mask at all times, including in the patient?s room. ?Patients must also wear a mask when staff or their support person are in the room. ?Visitors GUEST BADGE MUST BE WORN VISIBLY  ?One adult visitor may remain with you overnight and MUST be in the room by 8 P.M. ?  ? ? Your procedure is scheduled on: 01/03/22 ? ? Report to [facility] Main Entrance ? ?  Report to short stay at 5:15AM ? ? Call this number if you have problems the morning of surgery [phone] ? ? Do not eat food :After Midnight. ? ? After Midnight you may have the following liquids until __4:30_ AM DAY OF SURGERY ? ?Water ?Black Coffee (sugar ok, NO MILK/CREAM OR CREAMERS)  ?Tea (sugar ok, NO MILK/CREAM OR CREAMERS) regular and decaf                             ?Plain Jell-O (NO RED)                                           ?Fruit ices (not with fruit pulp, NO RED)                                     ?Popsicles (NO RED)                                                                  ?Juice: apple, WHITE grape, WHITE cranberry ?Sports drinks like Gatorade (NO RED) ?Clear broth(vegetable,chicken,beef) ? ?     ? ?  ?       If you have questions, please contact your surgeon?s office. ? ? ?FOLLOW BOWEL PREP AND ANY ADDITIONAL PRE OP INSTRUCTIONS YOU RECEIVED FROM YOUR SURGEON'S OFFICE!!! ?  ?  ?Oral Hygiene is also important to reduce your risk of infection.                                    ?Remember - BRUSH YOUR TEETH THE MORNING OF SURGERY WITH YOUR REGULAR TOOTHPASTE ? ? Do NOT smoke after Midnight ? ? Take these medicines the morning of surgery with A SIP OF WATER:  Metoprolol, Pantoprazole ? ? ? Before surgery.Stop taking _Eliquis__________on __4/27________as instructed by _____________. ?. ?  ?Bring CPAP mask and tubing day of surgery. ?                  ?           You may not have any metal on your  body including hair pins, jewelry, and body piercing ? ?           Do not wear make-up, lotions, powders, perfumes/cologne, or deodorant ? ?Do not wear nail polish including gel and S&S, artificial/acrylic nails, or any other type of covering on natural nails including finger and toenails. If you have artificial nails, gel coating, etc. that needs to be removed by a nail salon please have this removed prior to surgery or surgery may need to be canceled/ delayed if the surgeon/ anesthesia feels like they are unable to be safely monitored.  ? ?Do not shave  48 hours prior to surgery.  ? ? ? Do not bring valuables to the hospital. [facility] NOT ?            RESPONSIBLE   FOR VALUABLES. ? ? Contacts, dentures or bridgework may not be worn into surgery. ? ? Bring small overnight bag day of surgery. ? . ? ? Special Instructions: Bring a copy of your healthcare power of attorney and living will documents  the day of surgery if you haven't scanned them before. ? ?            Please read over the following fact sheets you were given: IF [place] [phone] ? ?   [facility] - Preparing for Surgery ?Before surgery, you can play an important role.  Because skin is not sterile, your skin needs to be as free of germs as possible.  You can reduce the number of germs on your skin by washing with CHG (chlorahexidine gluconate) soap before surgery.  CHG is an antiseptic cleaner which kills germs and bonds with the skin to continue killing germs even after washing. ?Please DO NOT use if you have an allergy to CHG or antibacterial soaps.  If your skin becomes reddened/irritated stop using the CHG and inform your nurse when you arrive at  Short Stay. ?Do not shave (including legs and underarms) for at least 48 hours prior to the first CHG shower. ?Please follow these instructions carefully: ? 1.  Shower with CHG Soap the night before surgery and the  morning of Surgery. ? 2.  If you choose to wash your hair, wash your hair first as usual with your  normal  shampoo. ? 3.  After you shampoo, rinse your hair and body thoroughly to remove the  shampoo.                     ?       4.  Use CHG as you would any other liquid soap.  You can apply chg directly  to the skin and wash  ?                     Gently with a scrungie or clean washcloth. ? 5.  Apply the CHG Soap to your body ONLY FROM THE NECK DOWN.   Do not use on face/ open      ?                     Wound or open sores. Avoid contact with eyes, ears mouth and genitals (private parts).  ?                     Production manager,  Genitals (private parts) with your normal soap. ?  6.  Wash thoroughly, paying special attention to the area where your surgery  will be performed. ? 7.  Thoroughly rinse your body with warm water from the neck down. ? 8.  DO NOT shower/wash with your normal soap after using and rinsing off  the CHG Soap. ?               9.  Pat yourself dry with a clean towel. ?           10.  Wear clean pajamas. ?           11.  Place clean sheets on your bed the night of your first shower and do not  sleep with pets. ?Day of Surgery : ?Do not apply any lotions/deodorants the morning of surgery.  Please wear clean clothes to the hospital/surgery center. ? ?FAILURE TO FOLLOW THESE INSTRUCTIONS MAY RESULT IN THE CANCELLATION OF YOUR SURGERY ? ? ? ?________________________________________________________________________  ?

## 2021-12-14 NOTE — Progress Notes (Signed)
PERIOPERATIVE PRESCRIPTION FOR IMPLANTED CARDIAC DEVICE PROGRAMMING ? ?Patient Information: ?Name:  Tonya Graves  ?DOB:  04-Sep-1951  ?MRN:  [id number]  ?  ? ?Planned Procedure:  Robot takedown of hiatal hernia  ?Surgeon:  Dr. Rockne Coons  ?Date of Procedure:  12/16/21  ?Cautery will be used.  ?Position during surgery:    ? ?Please send documentation back to:  ?[facility] (Fax # [phone])  ? ?Johny Drilling, RN  ?12/14/2021 8:12 AM  ?Device Information: ? ?Clinic EP Physician:  Dr. Sallyanne Kuster  ? ?Device Type:  Medtronic [id number] II ?Manufacturer and Phone #:  Medtronic: [phone] ?Pacemaker Dependent?:  N/A ?Date of Last Device Check:  12/14/21 Normal Device Function?:  Yes.   ? ?Electrophysiologist's Recommendations: ? ?No recommendations as this is just a monitoring device and does not have the ability to control heart rate/function ? ?Per Device Clinic Standing Orders, ?Willadean Carol, RN  ?1:57 PM 12/14/2021  ?

## 2021-12-16 ENCOUNTER — Other Ambulatory Visit: Payer: Self-pay

## 2021-12-16 ENCOUNTER — Encounter (HOSPITAL_COMMUNITY)
Admission: RE | Admit: 2021-12-16 | Discharge: 2021-12-16 | Disposition: A | Discharge status: Home / Self Care | Payer: PPO | Source: Ambulatory Visit | Attending: Surgery | Admitting: Surgery

## 2021-12-16 ENCOUNTER — Encounter (HOSPITAL_COMMUNITY): Payer: Self-pay

## 2021-12-16 VITALS — BP 158/84 | HR 64 | Temp 97.9°F | Resp 18 | Ht 61.0 in | Wt 193.2 lb

## 2021-12-16 DIAGNOSIS — I480 Paroxysmal atrial fibrillation: Secondary | ICD-10-CM | POA: Insufficient documentation

## 2021-12-16 DIAGNOSIS — Z01818 Encounter for other preprocedural examination: Secondary | ICD-10-CM | POA: Insufficient documentation

## 2021-12-16 DIAGNOSIS — Z8673 Personal history of transient ischemic attack (TIA), and cerebral infarction without residual deficits: Secondary | ICD-10-CM | POA: Insufficient documentation

## 2021-12-16 DIAGNOSIS — K449 Diaphragmatic hernia without obstruction or gangrene: Secondary | ICD-10-CM | POA: Insufficient documentation

## 2021-12-16 DIAGNOSIS — R7303 Prediabetes: Secondary | ICD-10-CM | POA: Insufficient documentation

## 2021-12-16 HISTORY — DX: Cerebral infarction, unspecified: I63.9

## 2021-12-16 HISTORY — DX: Unspecified atrial fibrillation: I48.91

## 2021-12-16 HISTORY — DX: Cardiac arrhythmia, unspecified: I49.9

## 2021-12-16 LAB — HEMOGLOBIN A1C
Hgb A1c MFr Bld: 6.1 % — ABNORMAL HIGH (ref 4.8–5.6)
Mean Plasma Glucose: 128.37 mg/dL

## 2021-12-16 LAB — BASIC METABOLIC PANEL
Anion gap: 3 — ABNORMAL LOW (ref 5–15)
BUN: 23 mg/dL (ref 8–23)
CO2: 28 mmol/L (ref 22–32)
Calcium: 8.9 mg/dL (ref 8.9–10.3)
Chloride: 111 mmol/L (ref 98–111)
Creatinine, Ser: 0.79 mg/dL (ref 0.44–1.00)
GFR, Estimated: >60 mL/min (ref >60)
Glucose, Bld: 102 mg/dL — ABNORMAL HIGH (ref 70–99)
Potassium: 4.1 mmol/L (ref 3.5–5.1)
Sodium: 142 mmol/L (ref 135–145)

## 2021-12-16 LAB — CBC
HCT: 38.5 % (ref 36.0–46.0)
Hemoglobin: 12.2 g/dL (ref 12.0–15.0)
MCH: 28.8 pg (ref 26.0–34.0)
MCHC: 31.7 g/dL (ref 30.0–36.0)
MCV: 91 fL (ref 80.0–100.0)
Platelets: 215 10*3/uL (ref 150–400)
RBC: 4.23 MIL/uL (ref 3.87–5.11)
RDW: 14.4 % (ref 11.5–15.5)
WBC: 3.6 10*3/uL — ABNORMAL LOW (ref 4.0–10.5)
nRBC: 0 % (ref 0.0–0.2)

## 2021-12-16 LAB — GLUCOSE, CAPILLARY: Glucose-Capillary: 100 mg/dL — ABNORMAL HIGH (ref 70–99)

## 2021-12-16 NOTE — Progress Notes (Signed)
Anesthesia note: ? ?Bowel prep reminder:NA ? ?PCP - [name] PA ?Cardiologist -Dr. Adora Fridge ?Other-  ? ?Chest x-ray - no ?EKG - 11/03/21-epic ?Stress Test - no ?ECHO - 10/03/17-epic ?Cardiac Cath - no ? ?Pacemaker/ICD device last checked:Loop recorder 11/13/21 ? ?Sleep Study - no ?CPAP -  ? ?Pt is pre diabetic-CBG was 106 ?Fasting Blood Sugar -  ?Checks Blood Sugar _____ ? ?Blood Thinner:Eliquis/ Dr. Gwenlyn Found for V tac ?Blood Thinner Instructions:Stop 3 days prior to DOS ?Aspirin Instructions: ?Last Dose:12/30/21 ? ?Anesthesia review: yes ? ?Patient denies shortness of breath, fever, cough and chest pain at PAT appointment ?Pt gets SOB because of the hiatal hernia.  ? ?Patient verbalized understanding of instructions that were given to them at the PAT appointment. Patient was also instructed that they will need to review over the PAT instructions again at home before surgery. yes ?

## 2021-12-17 ENCOUNTER — Encounter (HOSPITAL_COMMUNITY): Payer: Self-pay

## 2021-12-17 ENCOUNTER — Other Ambulatory Visit: Payer: Self-pay | Admitting: General Practice

## 2021-12-17 NOTE — Telephone Encounter (Signed)
Prescription refill request for Eliquis received. ?Indication:Afib ?Last office visit:3/23 ?Scr:0.7 ?Age: 71 ?Weight:87.7 kg ? ?Prescription refilled ? ?

## 2021-12-17 NOTE — Progress Notes (Signed)
Anesthesia Chart Review: ? ? Case: [id number] Date/Time: 01/03/22 0700  ? Procedure: XI ROBOTIC TAKEDOWN HIATAL HERNIA with fundoplication  ? Anesthesia type: General  ? Pre-op diagnosis: paraesophageal hiatal hernia  ? [facility] RO[facility]  ? Surgeons: Johnathan Hausen, MD  ? ?  ? ? ?DISCUSSION: ?Pt is 71 years old with hx PAF, stroke (2021), loop recorder (implanted 2022), prediabetes  ? ?Pt to hold eliquis 3 days before surgery ? ?- Pt has new pt appointment with EP cardiologist Lars Mage, MD on 12/24/21 to evaluate loop recorder finding of "episodes of sustained severe tachycardia (over 200 bpm) that are relatively regular and may represent atrial tachycardia or atrial flutter with 1:1 AV conduction, but there is QRS morphology change. Consider VT (double tachycardia) versus rate related aberrant conduction" ? ? ?VS: BP (!) 158/84   Pulse 64   Temp 36.6 ?C (Oral)   Resp 18   Ht '5\' 1"'$  (1.549 m)   Wt 87.7 kg   SpO2 100%   BMI 36.51 kg/m?  ? ?PROVIDERS: ?- PCP is [name], PA-C ?- Cardiologist is Quay Burow, MD. Is aware of surgery and gave ok ot hold eliquis. Last office visit 11/03/21 ? ? ?LABS: Labs reviewed: Acceptable for surgery. ?(all labs ordered are listed, but only abnormal results are displayed) ? ?Labs Reviewed  ?HEMOGLOBIN A1C - Abnormal; Notable for the following components:  ?    Result Value  ? Hgb A1c MFr Bld 6.1 (*)   ? All other components within normal limits  ?BASIC METABOLIC PANEL - Abnormal; Notable for the following components:  ? Glucose, Bld 102 (*)   ? Anion gap 3 (*)   ? All other components within normal limits  ?CBC - Abnormal; Notable for the following components:  ? WBC 3.6 (*)   ? All other components within normal limits  ?GLUCOSE, CAPILLARY - Abnormal; Notable for the following components:  ? Glucose-Capillary 100 (*)   ? All other components within normal limits  ? ? ?EKG 11/03/21: sinus bradycardia 55 with left axis deviation and minimal voltage criteria for  LVH ? ? ?CV: ?Echo 07/13/20 (care everywhere): ?1. The left ventricle is normal in size with normal wall thickness.  ?2. The left ventricular systolic function is normal, LVEF is visually estimated at > 55%.  ?3. The mitral valve leaflets are mildly thickened with normal leaflet mobility.  ?4. Mitral annular calcification is present (mild).  ?5. The right ventricle is not well visualized but probably normal in size, with normal systolic function.  ?6. IVC size and inspiratory change suggest mildly elevated right atrial pressure. (5-10 mmHg).  ? ?Cardiac event monitor 10/04/19:  ?1: sinus rhythm/sinus bradycardia/sinus tachycardia ?2: Occasional PACs and PVCs ?3: Short runs of atrial tachycardia ?4: Occasional short 4 beat runs of nonsustained ventricular tachycardia. ? ? ?Past Medical History:  ?Diagnosis Date  ? Arthritis   ? OA  ? Atrial fibrillation ([place])   ? Bronchitis   ? Cataracts, bilateral   ? Dysrhythmia   ? history Vtac  ? GERD (gastroesophageal reflux disease)   ? Heart palpitations 2016 august  ? High cholesterol   ? History of hiatal hernia   ? Osteopenia   ? Prediabetes   ? Stroke [facility])   ? Urine frequency   ? UTI (urinary tract infection)   ? Varicose veins   ? Ventricular tachycardia (paroxysmal) ([place]) 08/2019  ? 30 day monitor.  ? ? ?Past Surgical History:  ?Procedure Laterality  Date  ?  C SECTIONS    ? X 2  ? MOUTH SURGERY  2013  ? TENDON REPAIR Right  YEARS AGO  ? LITTLE FINGER  ? TOTAL KNEE ARTHROPLASTY Right 07/13/2015  ? Procedure: RIGHT TOTAL KNEE ARTHROPLASTY;  Surgeon: Gaynelle Arabian, MD;  [facility];  Service: Orthopedics;  Laterality: Right;  ? TOTAL KNEE ARTHROPLASTY Left 06/06/2016  ? Procedure: LEFT TOTAL KNEE ARTHROPLASTY;  Surgeon: Gaynelle Arabian, MD;  [facility];  Service: Orthopedics;  Laterality: Left;  ? ? ?MEDICATIONS: ? atorvastatin (LIPITOR) 80 MG tablet  ? Calcium Carbonate-Vitamin D (CALCIUM 500 + D PO)  ? cyclobenzaprine (FLEXERIL) 5 MG tablet  ? ELIQUIS 5 MG TABS  tablet  ? loratadine (CLARITIN) 10 MG tablet  ? metoprolol tartrate (LOPRESSOR) 25 MG tablet  ? pantoprazole (PROTONIX) 40 MG tablet  ? Propylene Glycol (SYSTANE COMPLETE OP)  ? Vitamin D, Ergocalciferol, (DRISDOL) 1.25 MG (50000 UNIT) CAPS capsule  ? Wheat Dextrin (BENEFIBER) POWD  ? ?No current facility-administered medications for this encounter.  ? ?- Pt to hold eliquis 3 days before surgery ? ?Willeen Cass, PhD, FNP-BC ?[facility]/Anesthesiology ?Phone: [phone] ?12/17/2021 3:28 PM ? ? ? ? ? ? ? ?

## 2021-12-20 ENCOUNTER — Ambulatory Visit (INDEPENDENT_AMBULATORY_CARE_PROVIDER_SITE_OTHER)
Admission: RE | Admit: 2021-12-20 | Discharge: 2021-12-20 | Disposition: A | Discharge status: Home / Self Care | Payer: Self-pay | Source: Ambulatory Visit | Attending: Cardiovascular Disease | Admitting: Cardiovascular Disease

## 2021-12-20 DIAGNOSIS — E782 Mixed hyperlipidemia: Secondary | ICD-10-CM

## 2021-12-23 NOTE — Progress Notes (Signed)
?Electrophysiology Office Note:   ? ?Date:  12/24/2021  ? ?ID:  Tonya Graves, DOB 02-06-1951, MRN [id number] ? ?PCP:  [name], PA-C  ?[facility] Cardiologist:  Quay Burow, MD  ?[facility] Electrophysiologist:  Vickie Epley, MD  ? ?Referring MD: [name], MD  ? ?Chief Complaint: Arrhythmia ? ?History of Present Illness:   ? ?Tonya Graves is a 71 y.o. female who presents for an evaluation of arrhythmia at the request of Dr. Gwenlyn Found. Their medical history includes stroke, paroxysmal atrial fibrillation, hernia, hyperlipidemia.  The patient had a loop recorder implanted in the past for a history of cryptogenic stroke.  Loop recorder showed paroxysmal atrial fibrillation and Eliquis was prescribed.  On a recent loop irrigation there was a regular tachycardia with ventricular rates of greater than 200 beats a minute.  She is referred for further recommendations about this tachycardia. ? ?She is doing well today.  She tells me that she can tell when she is in atrial fibrillation with palpitations.  She had an episode recently where she felt like her heart was getting pound out of her chest and her heart rate was 173 on a pulse ox machine.  She did feel lightheaded during the episode.  Episodes as severe as that are fairly rare according to the patient.  She continues to take Eliquis.  She has an upcoming paraesophageal hernia repair on May 1. ? ? ?  ?Past Medical History:  ?Diagnosis Date  ? Arthritis   ? OA  ? Atrial fibrillation ([place])   ? Bronchitis   ? Cataracts, bilateral   ? Dysrhythmia   ? history Vtac  ? GERD (gastroesophageal reflux disease)   ? Heart palpitations 2016 august  ? High cholesterol   ? History of hiatal hernia   ? Osteopenia   ? Prediabetes   ? Stroke [facility])   ? Urine frequency   ? UTI (urinary tract infection)   ? Varicose veins   ? Ventricular tachycardia (paroxysmal) ([place]) 08/2019  ? 30 day monitor.  ? ? ?Past Surgical History:  ?Procedure Laterality Date  ?  C  SECTIONS    ? X 2  ? MOUTH SURGERY  2013  ? TENDON REPAIR Right  YEARS AGO  ? LITTLE FINGER  ? TOTAL KNEE ARTHROPLASTY Right 07/13/2015  ? Procedure: RIGHT TOTAL KNEE ARTHROPLASTY;  Surgeon: Gaynelle Arabian, MD;  [facility];  Service: Orthopedics;  Laterality: Right;  ? TOTAL KNEE ARTHROPLASTY Left 06/06/2016  ? Procedure: LEFT TOTAL KNEE ARTHROPLASTY;  Surgeon: Gaynelle Arabian, MD;  [facility];  Service: Orthopedics;  Laterality: Left;  ? ? ?Current Medications: ?Current Meds  ?Medication Sig  ? atorvastatin (LIPITOR) 80 MG tablet TAKE 1 TABLET BY MOUTH EVERY DAY  ? Calcium Carbonate-Vitamin D (CALCIUM 500 + D PO) Take 1 tablet by mouth 2 (two) times daily.  ? cyclobenzaprine (FLEXERIL) 5 MG tablet Take 1 tablet (5 mg total) by mouth at bedtime. (Patient taking differently: Take 5 mg by mouth at bedtime as needed for muscle spasms.)  ? ELIQUIS 5 MG TABS tablet TAKE 1 TABLET BY MOUTH TWICE A DAY  ? loratadine (CLARITIN) 10 MG tablet Take 10 mg by mouth at bedtime.  ? metoprolol tartrate (LOPRESSOR) 25 MG tablet TAKE 1 TABLET (25 MG TOTAL) BY MOUTH AS NEEDED (FOR PALPITATIONS). (Patient taking differently: Take 25 mg by mouth 2 (two) times daily.)  ? pantoprazole (PROTONIX) 40 MG tablet TAKE 1 TABLET BY MOUTH EVERY DAY  ?  Propylene Glycol (SYSTANE COMPLETE OP) Place 1 drop into both eyes daily.  ? Vitamin D, Ergocalciferol, (DRISDOL) 1.25 MG (50000 UNIT) CAPS capsule TAKE 1 CAPSULE BY MOUTH ONE TIME PER WEEK (Patient taking differently: Take 50,000 Units by mouth every Friday.)  ? Wheat Dextrin (BENEFIBER) POWD Take 1 Scoop by mouth at bedtime.  ?  ? ?Allergies:   Patient has no known allergies.  ? ?Social History  ? ?Socioeconomic History  ? Marital status: Married  ?  Spouse name: Not on file  ? Number of children: Not on file  ? Years of education: Not on file  ? Highest education level: Not on file  ?Occupational History  ? Not on file  ?Tobacco Use  ? Smoking status: Never  ? Smokeless tobacco: Never   ?Vaping Use  ? Vaping Use: Never used  ?Substance and Sexual Activity  ? Alcohol use: No  ? Drug use: No  ? Sexual activity: Not on file  ?Other Topics Concern  ? Not on file  ?Social History Narrative  ? Not on file  ? ?Social Determinants of Health  ? ?Financial Resource Strain: Low Risk   ? Difficulty of Paying Living Expenses: Not hard at all  ?Food Insecurity: No Food Insecurity  ? Worried About Charity fundraiser in the Last Year: Never true  ? Ran Out of Food in the Last Year: Never true  ?Transportation Needs: No Transportation Needs  ? Lack of Transportation (Medical): No  ? Lack of Transportation (Non-Medical): No  ?Physical Activity: Inactive  ? Days of Exercise per Week: 0 days  ? Minutes of Exercise per Session: 0 min  ?Stress: Stress Concern Present  ? Feeling of Stress : Rather much  ?Social Connections: Unknown  ? Frequency of Communication with Friends and Family: More than three times a week  ? Frequency of Social Gatherings with Friends and Family: More than three times a week  ? Attends Religious Services: Not on file  ? Active Member of Clubs or Organizations: Yes  ? Attends Archivist Meetings: More than 4 times per year  ? Marital Status: Married  ?  ? ?Family History: ?The patient's family history includes Atrial fibrillation in her mother and sister; Cancer in her maternal grandmother; Heart disease in her father; High Cholesterol in her father; High blood pressure in her mother; Leukemia in her maternal grandmother; Other in her maternal grandfather; Stroke in her father and paternal grandmother. ? ?ROS:   ?Please see the history of present illness.    ?All other systems reviewed and are negative. ? ?EKGs/Labs/Other Studies Reviewed:   ? ?The following studies were reviewed today: ? ?October 03, 2017 echo ?Normal left ventricular function, 55% ? ?December 14, 2021 Loop recorder interrogation personally reviewed ?A-fib burden 1.9%.  Ventricular rate median during atrial fibrillation  is about 100 beats a minute on the histogram. ? ?October 12, 2021 Loop recorder interrogation personally reviewed.  Episodes of atrial fibrillation.  Episodes of a regular tachycardia with a ventricular rate of 207-222 beats a minute.  Review of rhythm strips suggest atrial flutter although I cannot completely exclude other mechanisms of SVT such as AVNRT. ? ? ? ? ?Recent Labs: ?11/15/2021: ALT 16; TSH 2.340 ?12/16/2021: BUN 23; Creatinine, Ser 0.79; Hemoglobin 12.2; Platelets 215; Potassium 4.1; Sodium 142  ?Recent Lipid Panel ?   ?Component Value Date/Time  ? CHOL 170 11/15/2021 0956  ? TRIG 64 11/15/2021 0956  ? HDL 97 11/15/2021 0956  ? CHOLHDL  1.8 11/15/2021 0956  ? [place] 61 11/15/2021 0956  ? ? ?Physical Exam:   ? ?VS:  BP 124/70   Pulse 62   Ht '5\' 1"'$  (1.549 m)   Wt 197 lb 6.4 oz (89.5 kg)   SpO2 97%   BMI 37.30 kg/m?    ? ?Wt Readings from Last 3 Encounters:  ?12/24/21 197 lb 6.4 oz (89.5 kg)  ?12/16/21 193 lb 4 oz (87.7 kg)  ?11/15/21 194 lb 6.4 oz (88.2 kg)  ?  ? ?GEN:  Well nourished, well developed in no acute distress.  Obese ?HEENT: Normal ?NECK: No JVD; No carotid bruits ?LYMPHATICS: No lymphadenopathy ?CARDIAC: RRR, no murmurs, rubs, gallops ?RESPIRATORY:  Clear to auscultation without rales, wheezing or rhonchi  ?ABDOMEN: Soft, non-tender, non-distended ?MUSCULOSKELETAL:  No edema; No deformity  ?SKIN: Warm and dry ?NEUROLOGIC:  Alert and oriented x 3 ?PSYCHIATRIC:  Normal affect  ? ? ?  ? ?ASSESSMENT:   ? ?1. PAF (paroxysmal atrial fibrillation) ([place])   ?2. Cerebrovascular accident (CVA), unspecified mechanism ([place])   ?3. Atrial flutter, unspecified type ([place])   ? ?PLAN:   ? ?In order of problems listed above: ? ? ?#Atrial fibrillation ?#Atrial flutter ?On Eliquis ?Long discussion with the patient today about the pathophysiology of atrial fibrillation.  We discussed the risk of stroke while in atrial fibrillation and the importance of anticoagulation.  We discussed treatment strategies for her  atrial fibrillation including continued conservative management, antiarrhythmic drug therapy or catheter ablation.  I do think a rhythm control strategy is ultimately indicated for her given the highly symptomati

## 2021-12-24 ENCOUNTER — Encounter: Payer: Self-pay | Admitting: Cardiology

## 2021-12-24 ENCOUNTER — Ambulatory Visit: Payer: PPO | Admitting: Cardiology

## 2021-12-24 VITALS — BP 124/70 | HR 62 | Ht 61.0 in | Wt 197.4 lb

## 2021-12-24 DIAGNOSIS — I4892 Unspecified atrial flutter: Secondary | ICD-10-CM

## 2021-12-24 DIAGNOSIS — I639 Cerebral infarction, unspecified: Secondary | ICD-10-CM | POA: Diagnosis not present

## 2021-12-24 DIAGNOSIS — I480 Paroxysmal atrial fibrillation: Secondary | ICD-10-CM

## 2021-12-24 NOTE — Patient Instructions (Signed)
Medication Instructions:  Your physician recommends that you continue on your current medications as directed. Please refer to the Current Medication list given to you today. *If you need a refill on your cardiac medications before your next appointment, please call your pharmacy*  Lab Work: None. If you have labs (blood work) drawn today and your tests are completely normal, you will receive your results only by: MyChart Message (if you have MyChart) OR A paper copy in the mail If you have any lab test that is abnormal or we need to change your treatment, we will call you to review the results.  Testing/Procedures: None.  Follow-Up: At [facility], you and your health needs are our priority.  As part of our continuing mission to provide you with exceptional heart care, we have created designated Provider Care Teams.  These Care Teams include your primary Cardiologist (physician) and Advanced Practice Providers (APPs -  Physician Assistants and Nurse Practitioners) who all work together to provide you with the care you need, when you need it.  Your physician wants you to follow-up in: 6 months with one of the following Advanced Practice Providers on your designated Care Team:    Renee Ursuy, PA-C Michael "Andy" Tillery, PA-C   You will receive a reminder letter in the mail two months in advance. If you don't receive a letter, please call our office to schedule the follow-up appointment.  We recommend signing up for the patient portal called "MyChart".  Sign up information is provided on this After Visit Summary.  MyChart is used to connect with patients for Virtual Visits (Telemedicine).  Patients are able to view lab/test results, encounter notes, upcoming appointments, etc.  Non-urgent messages can be sent to your provider as well.   To learn more about what you can do with MyChart, go to [email or web address].    Any Other Special Instructions Will Be Listed Below (If  Applicable).         

## 2021-12-28 NOTE — Progress Notes (Signed)
Carelink Summary Report / Loop Recorder 

## 2022-01-02 NOTE — H&P (Signed)
REFERRING PHYSICIAN: Misenheimer, Kathlene November,* ? ?PROVIDER: Larance Ratledge Donia Pounds, MD ? ?MRN: [id number] ?DOB: 1951-06-07 ? ?Chief Complaint: New Consultation (Hiatal Hernia ) ? ? ?History of Present Illness: ?Tonya Graves is a 71 y.o. female who is seen today as an office consultation at the request of Dr. Lyda Jester for evaluation of New Consultation (Hiatal Hernia ) ?.  ? ?Tonya Graves and her husband presented to the [place] office for evaluation of a large hiatal hernia. She is 71 years old and was seen by Dr. Lyda Jester for dysphagia and underwent an endoscopy and dilatation. She had a barium swallow performed in March 2022 that was read by Dr. Eden Emms and showed approximately two thirds of her stomach above the hemidiaphragm. The hernia sac above the diaphragm it increased significantly in size and was 15 cm x 12 cm compared to her study in 2017. He felt that the gastroesophageal junction was at the level of the diaphragm. She underwent endoscopy and colonoscopy by Dr. Lyda Jester and there was no evidence of Barrett's esophagus and was H. pylori negative. ? ?The hernia has become more symptomatic and that she has pain across her chest some and also she feels quickly and is unable to eat is having dysphagia. The dysphagia is aggravated by cold liquids. ? 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 She has regained most of the function and vision in that eye. As result she is now on Eliquis. I went ahead and drew out how I would approach her hiatal hernia with the robot or laparoscopically. I also gave her the booklet that goes along with reflux and has some depictions of hiatal hernia repair with fundoplication. I also mention that sometimes we do pexied his of the stomach or occasionally G-tubes to secure the stomach below the diaphragm. ? ?She wants to proceed with surgery and so we will schedule this and will [facility] at their convenience. ? ?Review of Systems: ?See HPI as well for other ROS. ? ?ROS  ? ?Medical History: ?Past Medical History:  ?Diagnosis Date  ? Anemia  ? Arrhythmia  ? Arthritis  ? GERD (gastroesophageal reflux disease)  ? History of stroke  ? Hyperlipidemia  ? ?Patient Active Problem List  ?Diagnosis  ? Acute laryngopharyngitis  ? Cerebrovascular accident (CVA) ([place])  ? Dyspnea on exertion  ? Insomnia  ? Mixed hyperlipidemia  ? OA (osteoarthritis) of knee  ? Obese  ? PAF (paroxysmal atrial fibrillation) (CMS-HCC)  ? Palpitations  ? Routine general medical examination at a health care facility  ? Ventricular tachycardia (paroxysmal)  ? Vitamin D insufficiency  ? ?Past Surgical History:  ?Procedure Laterality Date  ? CESAREAN SECTION  ? JOINT REPLACEMENT  ? ? ?No Known Allergies ? ?Current Outpatient Medications on File Prior to Visit  ?Medication Sig Dispense Refill  ? apixaban (ELIQUIS) 5 mg tablet Eliquis 5 mg tablet ?TAKE 1 TABLET BY MOUTH TWICE A DAY  ? atorvastatin (LIPITOR) 80 MG tablet atorvastatin 80 mg tablet ?TAKE 1 TABLET BY MOUTH EVERY DAY  ? cyclobenzaprine (FLEXERIL) 5 MG tablet cyclobenzaprine 5 mg tablet ?TAKE 1 TABLET BY MOUTH EVERYDAY AT BEDTIME  ? ergocalciferol, vitamin D2, 1,250 mcg (50,000 unit) capsule ergocalciferol (vitamin D2) 1,250 mcg (50,000 unit) capsule ?TAKE 1 CAPSULE BY MOUTH ONE TIME PER WEEK  ? metoprolol tartrate (LOPRESSOR) 25 MG tablet metoprolol tartrate 25 mg tablet ?TAKE 1 TABLET (25 MG TOTAL) BY MOUTH AS  NEEDED (FOR PALPITATIONS).  ? pantoprazole (PROTONIX) 40 MG DR tablet pantoprazole 40 mg tablet,delayed release ?TAKE 1 TABLET BY MOUTH EVERY DAY  ? predniSONE (DELTASONE) 20 MG tablet Take by mouth  ? sulfamethoxazole-trimethoprim (BACTRIM DS) 800-160 mg tablet Take 1 tablet by mouth 2 (two) times daily  ? ?No current facility-administered medications on file prior to visit.  ? ?Family History  ?Problem Relation Age of Onset  ? Stroke Mother  ? Skin  cancer Mother  ? High blood pressure (Hypertension) Mother  ? Hyperlipidemia (Elevated cholesterol) Mother  ? Hyperlipidemia (Elevated cholesterol) Father  ? Coronary Artery Disease (Blocked arteries around heart) Father  ? Colon cancer Father  ? Obesity Sister  ? Breast cancer Sister  ? ? ?Social History  ? ?Tobacco Use  ?Smoking Status Never  ?Smokeless Tobacco Never  ? ? ?Social History  ? ?Socioeconomic History  ? Marital status: Married  ?Tobacco Use  ? Smoking status: Never  ? Smokeless tobacco: Never  ?Vaping Use  ? Vaping Use: Never used  ?Substance and Sexual Activity  ? Alcohol use: Not Currently  ? Drug use: Never  ? ?Objective:  ? ?Vitals:  ?10/21/21 0911  ?BP: 130/78  ?Pulse: 68  ?SpO2: 93%  ?Weight: 88.6 kg (195 lb 6.4 oz)  ?Height: 154.9 cm ('5\' 1"'$ )  ? ?Body mass index is 36.92 kg/m?. ? ?Physical Exam ?General: Pleasant white female in no acute distress. ?HEENT : Unremarkable. ?Chest: Clear to auscultation ?Heart: Sinus rhythm ?Breast: Not examined ?Abdomen: No incisions in the upper abdomen. ?GU not performed ?Rectal not performed ?Extremities full range of motion with 2 total knees by Dr. Maureen Ralphs ?Neuro alert and oriented x3. Motor and sensory function grossly intact. The residual from her stroke is in her right eye which is most all function has returned. ? ?Labs, Imaging and Diagnostic Testing: ?Reviewed her report on her upper GI done down in [facility] and also Dr. Crisoforo Oxford notes. ? ?Assessment and Plan:  ?Diagnoses and all orders for this visit: ? ?Hiatal hernia with gastroesophageal reflux ? ? ? ?Plan robotic X repair of her hiatal hernia and probable partial or complete fundoplication. ? ?No follow-ups on file. ? ?Atticus Wedin Donia Pounds, MD   ? ?

## 2022-01-03 ENCOUNTER — Encounter (HOSPITAL_COMMUNITY): Payer: Self-pay | Admitting: Surgery

## 2022-01-03 ENCOUNTER — Encounter (HOSPITAL_COMMUNITY)
Admission: RE | Disposition: A | Discharge status: Home / Self Care | Payer: Self-pay | Source: Home / Self Care | Attending: Surgery

## 2022-01-03 ENCOUNTER — Other Ambulatory Visit: Payer: Self-pay

## 2022-01-03 ENCOUNTER — Ambulatory Visit (HOSPITAL_COMMUNITY): Payer: PPO | Admitting: Anesthesiology

## 2022-01-03 ENCOUNTER — Ambulatory Visit (HOSPITAL_COMMUNITY): Payer: PPO | Admitting: Emergency Medicine

## 2022-01-03 ENCOUNTER — Inpatient Hospital Stay (HOSPITAL_COMMUNITY)
Admission: RE | Admit: 2022-01-03 | Discharge: 2022-01-05 | DRG: 327 | Disposition: A | Discharge status: Home / Self Care | Payer: PPO | Attending: Surgery | Admitting: Surgery

## 2022-01-03 DIAGNOSIS — Z7901 Long term (current) use of anticoagulants: Secondary | ICD-10-CM | POA: Diagnosis not present

## 2022-01-03 DIAGNOSIS — Z7952 Long term (current) use of systemic steroids: Secondary | ICD-10-CM | POA: Diagnosis not present

## 2022-01-03 DIAGNOSIS — E782 Mixed hyperlipidemia: Secondary | ICD-10-CM | POA: Diagnosis present

## 2022-01-03 DIAGNOSIS — I4891 Unspecified atrial fibrillation: Secondary | ICD-10-CM | POA: Diagnosis not present

## 2022-01-03 DIAGNOSIS — E559 Vitamin D deficiency, unspecified: Secondary | ICD-10-CM | POA: Diagnosis not present

## 2022-01-03 DIAGNOSIS — I472 Ventricular tachycardia, unspecified: Secondary | ICD-10-CM | POA: Diagnosis not present

## 2022-01-03 DIAGNOSIS — G4700 Insomnia, unspecified: Secondary | ICD-10-CM | POA: Diagnosis not present

## 2022-01-03 DIAGNOSIS — Z8249 Family history of ischemic heart disease and other diseases of the circulatory system: Secondary | ICD-10-CM | POA: Diagnosis not present

## 2022-01-03 DIAGNOSIS — I69351 Hemiplegia and hemiparesis following cerebral infarction affecting right dominant side: Secondary | ICD-10-CM

## 2022-01-03 DIAGNOSIS — Z9889 Other specified postprocedural states: Principal | ICD-10-CM

## 2022-01-03 DIAGNOSIS — Z6837 Body mass index (BMI) 37.0-37.9, adult: Secondary | ICD-10-CM | POA: Diagnosis not present

## 2022-01-03 DIAGNOSIS — R1310 Dysphagia, unspecified: Secondary | ICD-10-CM | POA: Diagnosis not present

## 2022-01-03 DIAGNOSIS — Z823 Family history of stroke: Secondary | ICD-10-CM | POA: Diagnosis not present

## 2022-01-03 DIAGNOSIS — Z79899 Other long term (current) drug therapy: Secondary | ICD-10-CM

## 2022-01-03 DIAGNOSIS — K449 Diaphragmatic hernia without obstruction or gangrene: Secondary | ICD-10-CM

## 2022-01-03 DIAGNOSIS — K219 Gastro-esophageal reflux disease without esophagitis: Secondary | ICD-10-CM | POA: Diagnosis present

## 2022-01-03 DIAGNOSIS — E669 Obesity, unspecified: Secondary | ICD-10-CM | POA: Diagnosis not present

## 2022-01-03 DIAGNOSIS — I480 Paroxysmal atrial fibrillation: Secondary | ICD-10-CM | POA: Diagnosis present

## 2022-01-03 DIAGNOSIS — Z8673 Personal history of transient ischemic attack (TIA), and cerebral infarction without residual deficits: Secondary | ICD-10-CM | POA: Diagnosis not present

## 2022-01-03 HISTORY — PX: XI ROBOTIC ASSISTED HIATAL HERNIA REPAIR: SHX6889

## 2022-01-03 LAB — CBC
HCT: 36.6 % (ref 36.0–46.0)
Hemoglobin: 11.3 g/dL — ABNORMAL LOW (ref 12.0–15.0)
MCH: 28.9 pg (ref 26.0–34.0)
MCHC: 30.9 g/dL (ref 30.0–36.0)
MCV: 93.6 fL (ref 80.0–100.0)
Platelets: 152 10*3/uL (ref 150–400)
RBC: 3.91 MIL/uL (ref 3.87–5.11)
RDW: 14.2 % (ref 11.5–15.5)
WBC: 7 10*3/uL (ref 4.0–10.5)
nRBC: 0 % (ref 0.0–0.2)

## 2022-01-03 LAB — CREATININE, SERUM
Creatinine, Ser: 0.81 mg/dL (ref 0.44–1.00)
GFR, Estimated: >60 mL/min (ref >60)

## 2022-01-03 LAB — GLUCOSE, CAPILLARY: Glucose-Capillary: 94 mg/dL (ref 70–99)

## 2022-01-03 SURGERY — REPAIR, HERNIA, HIATAL, ROBOT-ASSISTED
Anesthesia: General

## 2022-01-03 MED ORDER — LIDOCAINE HCL (PF) 2 % IJ SOLN
INTRAMUSCULAR | Status: AC
  Filled 2022-01-03: qty 5

## 2022-01-03 MED ORDER — PROPOFOL 10 MG/ML IV BOLUS
INTRAVENOUS | Status: DC | PRN
  Administered 2022-01-03: 150 mg via INTRAVENOUS

## 2022-01-03 MED ORDER — FENTANYL CITRATE (PF) 100 MCG/2ML IJ SOLN
INTRAMUSCULAR | Status: AC
  Filled 2022-01-03: qty 2

## 2022-01-03 MED ORDER — CEFAZOLIN SODIUM-DEXTROSE 2-4 GM/100ML-% IV SOLN
2.0000 g | INTRAVENOUS | Status: AC
  Administered 2022-01-03: 2 g via INTRAVENOUS
  Filled 2022-01-03: qty 100

## 2022-01-03 MED ORDER — EPHEDRINE 5 MG/ML INJ
INTRAVENOUS | Status: AC
  Filled 2022-01-03: qty 5

## 2022-01-03 MED ORDER — FENTANYL CITRATE (PF) 250 MCG/5ML IJ SOLN
INTRAMUSCULAR | Status: AC
  Filled 2022-01-03: qty 5

## 2022-01-03 MED ORDER — CEFAZOLIN SODIUM-DEXTROSE 2-4 GM/100ML-% IV SOLN
2.0000 g | Freq: Three times a day (TID) | INTRAVENOUS | Status: AC
  Administered 2022-01-03: 2 g via INTRAVENOUS
  Filled 2022-01-03: qty 100

## 2022-01-03 MED ORDER — BUPIVACAINE LIPOSOME 1.3 % IJ SUSP: 20.0000 mL | Freq: Once | INTRAMUSCULAR | Status: DC

## 2022-01-03 MED ORDER — PHENYLEPHRINE HCL-NACL 20-0.9 MG/250ML-% IV SOLN
INTRAVENOUS | Status: DC | PRN
  Administered 2022-01-03: 35 ug/min via INTRAVENOUS

## 2022-01-03 MED ORDER — ACETAMINOPHEN 500 MG PO TABS
1000.0000 mg | ORAL_TABLET | ORAL | Status: AC
  Administered 2022-01-03: 1000 mg via ORAL
  Filled 2022-01-03: qty 2

## 2022-01-03 MED ORDER — AMISULPRIDE (ANTIEMETIC) 5 MG/2ML IV SOLN: 10.0000 mg | Freq: Once | INTRAVENOUS | Status: DC | PRN

## 2022-01-03 MED ORDER — GLYCOPYRROLATE 0.2 MG/ML IJ SOLN
INTRAMUSCULAR | Status: DC | PRN
  Administered 2022-01-03: .2 mg via INTRAVENOUS

## 2022-01-03 MED ORDER — LACTATED RINGERS IR SOLN
Status: DC | PRN
  Administered 2022-01-03: 1000 mL

## 2022-01-03 MED ORDER — DEXAMETHASONE SODIUM PHOSPHATE 10 MG/ML IJ SOLN
INTRAMUSCULAR | Status: AC
  Filled 2022-01-03: qty 1

## 2022-01-03 MED ORDER — 0.9 % SODIUM CHLORIDE (POUR BTL) OPTIME
TOPICAL | Status: DC | PRN
  Administered 2022-01-03: 1000 mL

## 2022-01-03 MED ORDER — LIDOCAINE 2% (20 MG/ML) 5 ML SYRINGE
INTRAMUSCULAR | Status: DC | PRN
  Administered 2022-01-03: 1.5 mg/kg/h via INTRAVENOUS

## 2022-01-03 MED ORDER — LIDOCAINE 2% (20 MG/ML) 5 ML SYRINGE
INTRAMUSCULAR | Status: DC | PRN
  Administered 2022-01-03: 100 mg via INTRAVENOUS

## 2022-01-03 MED ORDER — ORAL CARE MOUTH RINSE
15.0000 mL | Freq: Two times a day (BID) | OROMUCOSAL | Status: DC
  Administered 2022-01-03 – 2022-01-05 (×4): 15 mL via OROMUCOSAL

## 2022-01-03 MED ORDER — HEPARIN SODIUM (PORCINE) 5000 UNIT/ML IJ SOLN
5000.0000 [IU] | Freq: Three times a day (TID) | INTRAMUSCULAR | Status: DC
  Administered 2022-01-03 – 2022-01-05 (×5): 5000 [IU] via SUBCUTANEOUS
  Filled 2022-01-03 (×5): qty 1

## 2022-01-03 MED ORDER — DEXAMETHASONE SODIUM PHOSPHATE 10 MG/ML IJ SOLN
INTRAMUSCULAR | Status: DC | PRN
  Administered 2022-01-03: 5 mg via INTRAVENOUS

## 2022-01-03 MED ORDER — LIDOCAINE HCL 2 % IJ SOLN
INTRAMUSCULAR | Status: AC
  Filled 2022-01-03: qty 20

## 2022-01-03 MED ORDER — KCL IN DEXTROSE-NACL 20-5-0.45 MEQ/L-%-% IV SOLN
INTRAVENOUS | Status: DC
  Filled 2022-01-03 (×4): qty 1000

## 2022-01-03 MED ORDER — ONDANSETRON HCL 4 MG/2ML IJ SOLN
INTRAMUSCULAR | Status: DC | PRN
  Administered 2022-01-03: 4 mg via INTRAVENOUS

## 2022-01-03 MED ORDER — CHLORHEXIDINE GLUCONATE 0.12 % MT SOLN
15.0000 mL | Freq: Once | OROMUCOSAL | Status: AC
  Administered 2022-01-03: 15 mL via OROMUCOSAL

## 2022-01-03 MED ORDER — ROCURONIUM BROMIDE 10 MG/ML (PF) SYRINGE
PREFILLED_SYRINGE | INTRAVENOUS | Status: AC
  Filled 2022-01-03: qty 10

## 2022-01-03 MED ORDER — FENTANYL CITRATE PF 50 MCG/ML IJ SOSY
PREFILLED_SYRINGE | INTRAMUSCULAR | Status: AC
Start: 2022-01-03 — End: 2022-01-04
  Filled 2022-01-03: qty 3

## 2022-01-03 MED ORDER — LACTATED RINGERS IV SOLN: INTRAVENOUS | Status: DC

## 2022-01-03 MED ORDER — MORPHINE SULFATE (PF) 2 MG/ML IV SOLN: 1.0000 mg | INTRAVENOUS | Status: DC | PRN

## 2022-01-03 MED ORDER — PANTOPRAZOLE SODIUM 40 MG IV SOLR
40.0000 mg | Freq: Every day | INTRAVENOUS | Status: DC
  Administered 2022-01-03 – 2022-01-04 (×2): 40 mg via INTRAVENOUS
  Filled 2022-01-03: qty 10

## 2022-01-03 MED ORDER — PROPOFOL 10 MG/ML IV BOLUS
INTRAVENOUS | Status: AC
  Filled 2022-01-03: qty 20

## 2022-01-03 MED ORDER — ONDANSETRON 4 MG PO TBDP: 4.0000 mg | ORAL_TABLET | Freq: Four times a day (QID) | ORAL | Status: DC | PRN

## 2022-01-03 MED ORDER — ACETAMINOPHEN 500 MG PO TABS: 1000.0000 mg | ORAL_TABLET | Freq: Once | ORAL | Status: DC

## 2022-01-03 MED ORDER — ONDANSETRON HCL 4 MG/2ML IJ SOLN: 4.0000 mg | Freq: Four times a day (QID) | INTRAMUSCULAR | Status: DC | PRN

## 2022-01-03 MED ORDER — FENTANYL CITRATE (PF) 250 MCG/5ML IJ SOLN
INTRAMUSCULAR | Status: DC | PRN
  Administered 2022-01-03: 100 ug via INTRAVENOUS
  Administered 2022-01-03: 50 ug via INTRAVENOUS
  Administered 2022-01-03: 25 ug via INTRAVENOUS
  Administered 2022-01-03 (×2): 50 ug via INTRAVENOUS
  Administered 2022-01-03: 25 ug via INTRAVENOUS

## 2022-01-03 MED ORDER — CHLORHEXIDINE GLUCONATE CLOTH 2 % EX PADS: 6.0000 | MEDICATED_PAD | Freq: Once | CUTANEOUS | Status: DC

## 2022-01-03 MED ORDER — MIDAZOLAM HCL 2 MG/2ML IJ SOLN
INTRAMUSCULAR | Status: AC
  Filled 2022-01-03: qty 2

## 2022-01-03 MED ORDER — BUPIVACAINE LIPOSOME 1.3 % IJ SUSP
INTRAMUSCULAR | Status: DC | PRN
  Administered 2022-01-03: 5 mL

## 2022-01-03 MED ORDER — ORAL CARE MOUTH RINSE: 15.0000 mL | Freq: Once | OROMUCOSAL | Status: AC

## 2022-01-03 MED ORDER — METOPROLOL TARTRATE 5 MG/5ML IV SOLN: 5.0000 mg | Freq: Four times a day (QID) | INTRAVENOUS | Status: DC | PRN

## 2022-01-03 MED ORDER — ONDANSETRON HCL 4 MG/2ML IJ SOLN
INTRAMUSCULAR | Status: AC
  Filled 2022-01-03: qty 2

## 2022-01-03 MED ORDER — CELECOXIB 200 MG PO CAPS
200.0000 mg | ORAL_CAPSULE | Freq: Once | ORAL | Status: AC
  Administered 2022-01-03: 200 mg via ORAL
  Filled 2022-01-03: qty 1

## 2022-01-03 MED ORDER — ROCURONIUM BROMIDE 10 MG/ML (PF) SYRINGE
PREFILLED_SYRINGE | INTRAVENOUS | Status: DC | PRN
  Administered 2022-01-03: 80 mg via INTRAVENOUS
  Administered 2022-01-03 (×2): 20 mg via INTRAVENOUS

## 2022-01-03 MED ORDER — BUPIVACAINE-EPINEPHRINE (PF) 0.25% -1:200000 IJ SOLN
INTRAMUSCULAR | Status: AC
  Filled 2022-01-03: qty 30

## 2022-01-03 MED ORDER — HYDROCODONE-ACETAMINOPHEN 5-325 MG PO TABS
1.0000 | ORAL_TABLET | ORAL | Status: DC | PRN
  Administered 2022-01-03: 1 via ORAL
  Administered 2022-01-03: 2 via ORAL
  Administered 2022-01-04: 1 via ORAL
  Administered 2022-01-04 – 2022-01-05 (×2): 2 via ORAL
  Filled 2022-01-03: qty 1
  Filled 2022-01-03 (×3): qty 2
  Filled 2022-01-03: qty 1

## 2022-01-03 MED ORDER — SUGAMMADEX SODIUM 200 MG/2ML IV SOLN
INTRAVENOUS | Status: DC | PRN
  Administered 2022-01-03: 200 mg via INTRAVENOUS

## 2022-01-03 MED ORDER — SCOPOLAMINE 1 MG/3DAYS TD PT72
1.0000 | MEDICATED_PATCH | TRANSDERMAL | Status: DC
  Administered 2022-01-03: 1.5 mg via TRANSDERMAL
  Filled 2022-01-03: qty 1

## 2022-01-03 MED ORDER — BUPIVACAINE-EPINEPHRINE (PF) 0.25% -1:200000 IJ SOLN
INTRAMUSCULAR | Status: DC | PRN
  Administered 2022-01-03: 5 mL via PERINEURAL

## 2022-01-03 MED ORDER — BUPIVACAINE LIPOSOME 1.3 % IJ SUSP
INTRAMUSCULAR | Status: AC
  Filled 2022-01-03: qty 20

## 2022-01-03 MED ORDER — MIDAZOLAM HCL 5 MG/5ML IJ SOLN
INTRAMUSCULAR | Status: DC | PRN
  Administered 2022-01-03: 2 mg via INTRAVENOUS

## 2022-01-03 MED ORDER — FENTANYL CITRATE PF 50 MCG/ML IJ SOSY
25.0000 ug | PREFILLED_SYRINGE | INTRAMUSCULAR | Status: DC | PRN
  Administered 2022-01-03 (×2): 50 ug via INTRAVENOUS

## 2022-01-03 MED ORDER — EPHEDRINE SULFATE-NACL 50-0.9 MG/10ML-% IV SOSY
PREFILLED_SYRINGE | INTRAVENOUS | Status: DC | PRN
  Administered 2022-01-03: 5 mg via INTRAVENOUS

## 2022-01-03 SURGICAL SUPPLY — 61 items
ADH SKN CLS APL DERMABOND .7 (GAUZE/BANDAGES/DRESSINGS) ×1
APPLIER CLIP 5 13 M/L LIGAMAX5 (MISCELLANEOUS)
APPLIER CLIP ROT 13.4 12 LRG (CLIP)
APR CLP LRG 13.4X12 ROT 20 MLT (CLIP)
APR CLP MED LRG 5 ANG JAW (MISCELLANEOUS)
BLADE SURG 15 STRL LF DISP TIS (BLADE) ×2 IMPLANT
BLADE SURG 15 STRL SS (BLADE) ×2
CLIP APPLIE 5 13 M/L LIGAMAX5 (MISCELLANEOUS) IMPLANT
CLIP APPLIE ROT 13.4 12 LRG (CLIP) IMPLANT
CLIP LIGATING HEM O LOK PURPLE (MISCELLANEOUS) IMPLANT
COVER SURGICAL LIGHT HANDLE (MISCELLANEOUS) ×3 IMPLANT
COVER TIP SHEARS 8 DVNC (MISCELLANEOUS) ×2 IMPLANT
COVER TIP SHEARS 8MM DA VINCI (MISCELLANEOUS) ×2
DERMABOND ADVANCED (GAUZE/BANDAGES/DRESSINGS) ×1
DERMABOND ADVANCED .7 DNX12 (GAUZE/BANDAGES/DRESSINGS) ×2 IMPLANT
DEVICE TROCAR PUNCTURE CLOSURE (ENDOMECHANICALS) IMPLANT
DRAIN PENROSE 0.5X18 (DRAIN) ×3 IMPLANT
DRAPE ARM DVNC X/XI (DISPOSABLE) ×8 IMPLANT
DRAPE COLUMN DVNC XI (DISPOSABLE) ×2 IMPLANT
DRAPE DA VINCI XI ARM (DISPOSABLE) ×8
DRAPE DA VINCI XI COLUMN (DISPOSABLE) ×2
ELECT REM PT RETURN 15FT ADLT (MISCELLANEOUS) ×3 IMPLANT
GAUZE 4X4 16PLY ~~LOC~~+RFID DBL (SPONGE) ×3 IMPLANT
GLOVE SURG LX 8.0 MICRO (GLOVE) ×2
GLOVE SURG LX STRL 8.0 MICRO (GLOVE) ×4 IMPLANT
GOWN STRL REUS W/ TWL XL LVL3 (GOWN DISPOSABLE) ×8 IMPLANT
GOWN STRL REUS W/TWL XL LVL3 (GOWN DISPOSABLE) ×8
GRASPER SUT TROCAR 14GX15 (MISCELLANEOUS) IMPLANT
IRRIG SUCT STRYKERFLOW 2 WTIP (MISCELLANEOUS) ×2
IRRIGATION SUCT STRKRFLW 2 WTP (MISCELLANEOUS) ×2 IMPLANT
KIT BASIN OR (CUSTOM PROCEDURE TRAY) ×3 IMPLANT
KIT TURNOVER KIT A (KITS) IMPLANT
MARKER SKIN DUAL TIP RULER LAB (MISCELLANEOUS) ×3 IMPLANT
NEEDLE HYPO 22GX1.5 SAFETY (NEEDLE) ×3 IMPLANT
OBTURATOR OPTICAL STANDARD 8MM (TROCAR) ×2
OBTURATOR OPTICAL STND 8 DVNC (TROCAR) ×1
OBTURATOR OPTICALSTD 8 DVNC (TROCAR) ×2 IMPLANT
PACK CARDIOVASCULAR III (CUSTOM PROCEDURE TRAY) ×3 IMPLANT
PAD POSITIONING PINK XL (MISCELLANEOUS) IMPLANT
SCISSORS LAP 5X45 EPIX DISP (ENDOMECHANICALS) IMPLANT
SEAL CANN UNIV 5-8 DVNC XI (MISCELLANEOUS) ×8 IMPLANT
SEAL XI 5MM-8MM UNIVERSAL (MISCELLANEOUS) ×8
SEALER VESSEL DA VINCI XI (MISCELLANEOUS) ×2
SEALER VESSEL EXT DVNC XI (MISCELLANEOUS) ×2 IMPLANT
SOL ANTI FOG 6CC (MISCELLANEOUS) ×2 IMPLANT
SOLUTION ANTI FOG 6CC (MISCELLANEOUS) ×1
SOLUTION ELECTROLUBE (MISCELLANEOUS) ×3 IMPLANT
SPIKE FLUID TRANSFER (MISCELLANEOUS) ×3 IMPLANT
SUT ETHIBOND 0 36 GRN (SUTURE) ×9 IMPLANT
SUT MNCRL AB 4-0 PS2 18 (SUTURE) ×6 IMPLANT
SUT VICRYL 0 UR6 27IN ABS (SUTURE) IMPLANT
SYR 10ML ECCENTRIC (SYRINGE) ×3 IMPLANT
SYR 20ML LL LF (SYRINGE) ×3 IMPLANT
TIP INNERVISION DETACH 56FR (MISCELLANEOUS) ×1 IMPLANT
TOWEL OR 17X26 10 PK STRL BLUE (TOWEL DISPOSABLE) ×3 IMPLANT
TRAY FOLEY MTR SLVR 16FR STAT (SET/KITS/TRAYS/PACK) IMPLANT
TROCAR ADV FIXATION 12X100MM (TROCAR) ×3 IMPLANT
TROCAR ADV FIXATION 5X100MM (TROCAR) IMPLANT
TROCAR BLADELESS OPT 5 100 (ENDOMECHANICALS) ×3 IMPLANT
TUBING CONNECTING 10 (TUBING) IMPLANT
TUBING INSUFFLATION 10FT LAP (TUBING) ×3 IMPLANT

## 2022-01-03 NOTE — Op Note (Signed)
Tonya Graves  November 06, 1950 ? ? ?01/03/2022 ? ? ? ?PCP:  [name], PA-C ? ? ?Surgeon: Kaylyn Lim, MD, FACS ? ?Asst:  Romana Juniper, MD, FACS ? ?Anes:  general ? ?Preop Dx: Large type III mixed hiatal hernia ?Postop Dx: same ? ?Procedure: Xi robotic repair of hiatal hernia with a figure-of-eight and simple suture posteriorly, Nissen fundoplication over lighted 56 bougie ?Location Surgery: [facility] or 2 ?Complications: None noted ? ?EBL:   Minimal cc ? ?Drains: None ? ?Description of Procedure: ? The patient was taken to OR to.  After anesthesia was administered and the patient was prepped  with ChloraPrep and a timeout was performed.  Access was achieved to the left upper quadrant with a 5 mm Optiview.  Upon entering surveyed the abdomen in place with 4 robotic trocars about 2 fingerbreadth or 2 above the umbilicus across the abdomen.  An X assistance port was placed on the left lower quadrant.  The Nathanson retractor was placed through an upper midline incision and secured to the bed. ? ?Defect dissection began on the patient's right side incising the fibers getting into the gastrohepatic window.  Somewhat stringy right crus was noted and this sac was partially mobilized.  Worked over on the left side and was able to grabbed a nice sac and freed it from the mediastinum in toto.  This was brought down on the stomach and subsequently resected and removed. ? ?The crura was approximated with a 0 Surgidac placed posteriorly with a figure-of-eight snugly but not too tight.  A second simple suture was placed more proximal to the esophagus and tied and then the 56 lighted bougie was inserted revealing this was a good closure with minimal room around it. ? ?A Penrose of been brought around and I will use that as a track to go around and grasped a portion of the cardia fundus and bring it around.  This was behind the stomach and esophagus.  I think fashion to wrap taking the purchase of the stomach and then  periesophageal tissue and then the wrap stomach and placed 3 such sutures tying them down with the robot.  The 56 bougie was then removed.  We examined the wrap and it appeared to be healthy.  An Exparel block was placed prior to removing the trocars and closing the skin with 4-0 Monocryl and Dermabond.   ? ? ? ?The patient tolerated the procedure well and was taken to the PACU in stable condition.   ? ? ?Matt B. Hassell Done, MD, FACS ?[facility], [place] ?[phone]  ?

## 2022-01-03 NOTE — Anesthesia Preprocedure Evaluation (Addendum)
Anesthesia Evaluation  ?Patient identified by MRN, date of birth, ID band ?Patient awake ? ? ? ?Reviewed: ?Allergy & Precautions, NPO status , Patient's Chart, lab work & pertinent test results ? ?History of Anesthesia Complications ?Negative for: history of anesthetic complications ? ?Airway ?Mallampati: III ? ?TM Distance: >3 FB ?Neck ROM: Full ? ? ? Dental ?no notable dental hx. ?(+) Dental Advisory Given ?  ?Pulmonary ?neg pulmonary ROS,  ?  ?Pulmonary exam normal ? ? ? ? ? ? ? Cardiovascular ?Normal cardiovascular exam+ dysrhythmias Atrial Fibrillation and Ventricular Tachycardia  ? ? ?  ?Neuro/Psych ?CVA   ? GI/Hepatic ?Neg liver ROS, GERD  Medicated,  ?Endo/Other  ?negative endocrine ROS ? Renal/GU ?negative Renal ROS  ? ?  ?Musculoskeletal ?negative musculoskeletal ROS ?(+)  ? Abdominal ?  ?Peds ? Hematology ?negative hematology ROS ?(+)   ?Anesthesia Other Findings ? ? Reproductive/Obstetrics ? ?  ? ? ? ? ? ? ? ? ? ? ? ? ? ?  ?  ? ? ? ? ? ? ? ?Anesthesia Physical ?Anesthesia Plan ? ?ASA: 3 ? ?Anesthesia Plan: General  ? ?Post-op Pain Management: Celebrex PO (pre-op)* and Tylenol PO (pre-op)*  ? ?Induction:  ? ?PONV Risk Score and Plan: 4 or greater and Ondansetron, Dexamethasone, Midazolam and Treatment may vary due to age or medical condition ? ?Airway Management Planned: Oral ETT ? ?Additional Equipment:  ? ?Intra-op Plan:  ? ?Post-operative Plan: Extubation in OR ? ?Informed Consent: I have reviewed the patients History and Physical, chart, labs and discussed the procedure including the risks, benefits and alternatives for the proposed anesthesia with the patient or authorized representative who has indicated his/her understanding and acceptance.  ? ? ? ?Dental advisory given ? ?Plan Discussed with: Anesthesiologist and CRNA ? ?Anesthesia Plan Comments:   ? ? ? ? ? ?Anesthesia Quick Evaluation ? ?

## 2022-01-03 NOTE — Transfer of Care (Signed)
Immediate Anesthesia Transfer of Care Note ? ?Patient: Tonya Graves ? ?Procedure(s) Performed: XI ROBOTIC TAKEDOWN HIATAL HERNIA with fundoplication ? ?Patient Location: PACU ? ?Anesthesia Type:General ? ?Level of Consciousness: drowsy ? ?Airway & Oxygen Therapy: Patient Spontanous Breathing and Patient connected to face mask oxygen ? ?Post-op Assessment: Report given to RN and Post -op Vital signs reviewed and stable ? ?Post vital signs: Reviewed and stable ? ?Last Vitals:  ?Vitals Value Taken Time  ?BP 120/64 01/03/22 1546  ?Temp    ?Pulse 72 01/03/22 1547  ?Resp 13 01/03/22 1547  ?SpO2 100 % 01/03/22 1547  ?Vitals shown include unvalidated device data. ? ?Last Pain:  ?Vitals:  ? 01/03/22 1140  ?TempSrc:   ?PainSc: 0-No pain  ?   ? ?  ? ?Complications: No notable events documented. ?

## 2022-01-03 NOTE — Anesthesia Procedure Notes (Signed)
Procedure Name: Intubation ?Date/Time: 01/03/2022 12:51 PM ?Performed by: Maxwell Caul, CRNA ?Pre-anesthesia Checklist: Patient identified, Emergency Drugs available, Suction available and Patient being monitored ?Patient Re-evaluated:Patient Re-evaluated prior to induction ?Oxygen Delivery Method: Circle system utilized ?Preoxygenation: Pre-oxygenation with 100% oxygen ?Induction Type: IV induction ?Ventilation: Mask ventilation without difficulty ?Laryngoscope Size: Mac and 4 ?Grade View: Grade II ?Tube type: Oral ?Tube size: 7.0 mm ?Number of attempts: 1 ?Airway Equipment and Method: Stylet ?Placement Confirmation: ETT inserted through vocal cords under direct vision, positive ETCO2 and breath sounds checked- equal and bilateral ?Secured at: 21 cm ?Tube secured with: Tape ?Dental Injury: Teeth and Oropharynx as per pre-operative assessment  ? ? ? ? ?

## 2022-01-03 NOTE — Interval H&P Note (Signed)
History and Physical Interval Note: ? ?01/03/2022 ?12:20 PM ? ?Tonya Graves  has presented today for surgery, with the diagnosis of paraesophageal hiatal hernia.  The various methods of treatment have been discussed with the patient and family. After consideration of risks, benefits and other options for treatment, the patient has consented to  Procedure(s): ?XI ROBOTIC TAKEDOWN HIATAL HERNIA with fundoplication (N/A) as a surgical intervention.  The patient's history has been reviewed, patient examined, no change in status, stable for surgery.  I have reviewed the patient's chart and labs.  Questions were answered to the patient's satisfaction.   ? ? ?[name] ? ? ?

## 2022-01-03 NOTE — Anesthesia Postprocedure Evaluation (Signed)
Anesthesia Post Note ? ?Patient: Tonya Graves ? ?Procedure(s) Performed: XI ROBOTIC TAKEDOWN HIATAL HERNIA with fundoplication ? ?  ? ?Anesthesia Post Evaluation ?No notable events documented. ? ?Last Vitals:  ?Vitals:  ? 01/03/22 1630 01/03/22 1645  ?BP: (!) 141/72 (!) 145/75  ?Pulse: 63 61  ?Resp: 15 12  ?Temp:  (!) 36.4 ?C  ?SpO2: 100% 100%  ?  ?Last Pain:  ?Vitals:  ? 01/03/22 1645  ?TempSrc:   ?PainSc: Asleep  ? ? ?  ?  ?  ?  ?  ?  ? ?Jayke Caul DANIEL ? ? ? ? ?

## 2022-01-04 ENCOUNTER — Encounter (HOSPITAL_COMMUNITY): Payer: Self-pay | Admitting: Surgery

## 2022-01-04 NOTE — Progress Notes (Signed)
Patient ambulated 100 ft in hallway with front wheel walker, tolerated well.  ?

## 2022-01-04 NOTE — Progress Notes (Signed)
Patient ID: Tonya Graves, female   DOB: Jan 29, 1951, 71 y.o.   MRN: [id number] ?[facility] Progress Note:   1 Day Post-Op  ?Subjective: ?Mental status is alert and cooperative.  Complaints incisional soreness.  Marland Kitchen ?Objective: ?Vital signs in last 24 hours: ?Temp:  [97.6 ?F (36.4 ?C)-98.1 ?F (36.7 ?C)] 98.1 ?F (36.7 ?C) (05/02 2134) ?Pulse Rate:  [51-67] 67 (05/02 2134) ?Resp:  [12-20] 20 (05/02 2134) ?BP: (104-139)/(55-70) 104/58 (05/02 2134) ?SpO2:  [95 %-99 %] 99 % (05/02 2134) ? ?Intake/Output from previous day: ?05/01 0701 - 05/02 0700 ?In: 2386.2 [P.O.:120; I.V.:2266.2] ?Out: 1450 [Urine:1350; Blood:100] ?Intake/Output this shift: ?No intake/output data recorded. ? ?Physical Exam: Work of breathing is OK.  Started taking clears and will advance to full as tolerated.  Maintain IV fluids as PO intake may be marginal.  ? ?Lab Results:  ?Results for orders placed or performed during the hospital encounter of 01/03/22 (from the past 48 hour(s))  ?Glucose, capillary     Status: None  ? Collection Time: 01/03/22 11:24 AM  ?Result Value Ref Range  ? Glucose-Capillary 94 70 - 99 mg/dL  ?  Comment: Glucose reference range applies only to samples taken after fasting for at least 8 hours.  ?CBC     Status: Abnormal  ? Collection Time: 01/03/22  5:41 PM  ?Result Value Ref Range  ? WBC 7.0 4.0 - 10.5 K/uL  ? RBC 3.91 3.87 - 5.11 MIL/uL  ? Hemoglobin 11.3 (L) 12.0 - 15.0 g/dL  ? HCT 36.6 36.0 - 46.0 %  ? MCV 93.6 80.0 - 100.0 fL  ? MCH 28.9 26.0 - 34.0 pg  ? MCHC 30.9 30.0 - 36.0 g/dL  ? RDW 14.2 11.5 - 15.5 %  ? Platelets 152 150 - 400 K/uL  ? nRBC 0.0 0.0 - 0.2 %  ?  Comment: Performed at [facility], [place] [address], [place], [place] [address]  ?Creatinine, serum     Status: None  ? Collection Time: 01/03/22  5:41 PM  ?Result Value Ref Range  ? Creatinine, Ser 0.81 0.44 - 1.00 mg/dL  ? GFR, Estimated >60 >60 mL/min  ?  Comment: (NOTE) ?Calculated using the CKD-EPI Creatinine Equation  (2021) ?Performed at [facility], [address] ?[place], [place] [address] ?  ? ? ?Radiology/Results: ?No results found. ? ?Anti-infectives: ?Anti-infectives (From admission, onward)  ? ? Start     Dose/Rate Route Frequency Ordered Stop  ? 01/03/22 2000  ceFAZolin (ANCEF) IVPB 2g/100 mL premix       ? 2 g ?200 mL/hr over 30 Minutes Intravenous Every 8 hours 01/03/22 1725 01/03/22 2130  ? 01/03/22 1130  ceFAZolin (ANCEF) IVPB 2g/100 mL premix       ? 2 g ?200 mL/hr over 30 Minutes Intravenous On call to O.R. 01/03/22 1119 01/03/22 1252  ? ?  ? ? ?Assessment/Plan: ?Problem List: ?Patient Active Problem List  ? Diagnosis Date Noted  ? Status post laparoscopic Nissen fundoplication 46/96/2952  ? PAF (paroxysmal atrial fibrillation) ([place]) 12/08/2020  ? Mixed hyperlipidemia 10/21/2020  ? Vitamin D insufficiency 10/21/2020  ? Cerebrovascular accident (CVA) ([place]) 07/15/2020  ? Dyspnea on exertion 06/09/2020  ? Obese 02/25/2020  ? Insomnia 02/25/2020  ? Routine general medical examination at a health care facility 02/25/2020  ? Acute laryngopharyngitis 11/14/2019  ? Ventricular tachycardia (paroxysmal) ([place]) 08/2019  ? Palpitations 09/20/2017  ? OA (osteoarthritis) of knee 07/13/2015  ? ? ?Will need to assess postop hemoglobin  before restarting Eliquis.  She was a bit oozy yesterday.  Will assess PO intake in the morning to see if adequate.  Hopeful discharge tomorrow and will decide on timing of restarting Eliquis.   ?1 Day Post-Op  ? ? LOS: 1 day  ? ?Matt B. Hassell Done, MD, FACS ? ?[facility] ?[phone] to reach the surgeon on call.   ? ?01/04/2022 9:37 PM  ?

## 2022-01-04 NOTE — Progress Notes (Signed)
?  Transition of Care (TOC) Screening Note ? ? ?Patient Details  ?Name: Tonya Graves ?Date of Birth: [birth year] ? ? ?Transition of Care (TOC) CM/SW Contact:    ?Travone Georg, LCSW ?Phone Number: ?01/04/2022, 11:04 AM ? ? ? ?Transition of Care Department [facility]) has reviewed patient and no TOC needs have been identified at this time. We will continue to monitor patient advancement through interdisciplinary progression rounds. If new patient transition needs arise, please place a TOC consult. ? ? ?

## 2022-01-05 LAB — CBC WITH DIFFERENTIAL/PLATELET
Abs Immature Granulocytes: 0.02 10*3/uL (ref 0.00–0.07)
Basophils Absolute: 0 10*3/uL (ref 0.0–0.1)
Basophils Relative: 0 %
Eosinophils Absolute: 0 10*3/uL (ref 0.0–0.5)
Eosinophils Relative: 1 %
HCT: 34.7 % — ABNORMAL LOW (ref 36.0–46.0)
Hemoglobin: 11.1 g/dL — ABNORMAL LOW (ref 12.0–15.0)
Immature Granulocytes: 0 %
Lymphocytes Relative: 31 %
Lymphs Abs: 1.7 10*3/uL (ref 0.7–4.0)
MCH: 29.6 pg (ref 26.0–34.0)
MCHC: 32 g/dL (ref 30.0–36.0)
MCV: 92.5 fL (ref 80.0–100.0)
Monocytes Absolute: 0.5 10*3/uL (ref 0.1–1.0)
Monocytes Relative: 10 %
Neutro Abs: 3.1 10*3/uL (ref 1.7–7.7)
Neutrophils Relative %: 58 %
Platelets: 145 10*3/uL — ABNORMAL LOW (ref 150–400)
RBC: 3.75 MIL/uL — ABNORMAL LOW (ref 3.87–5.11)
RDW: 14.5 % (ref 11.5–15.5)
WBC: 5.3 10*3/uL (ref 4.0–10.5)
nRBC: 0 % (ref 0.0–0.2)

## 2022-01-05 LAB — BASIC METABOLIC PANEL
Anion gap: 5 (ref 5–15)
BUN: 14 mg/dL (ref 8–23)
CO2: 26 mmol/L (ref 22–32)
Calcium: 8.5 mg/dL — ABNORMAL LOW (ref 8.9–10.3)
Chloride: 111 mmol/L (ref 98–111)
Creatinine, Ser: 0.92 mg/dL (ref 0.44–1.00)
GFR, Estimated: >60 mL/min (ref >60)
Glucose, Bld: 96 mg/dL (ref 70–99)
Potassium: 4.1 mmol/L (ref 3.5–5.1)
Sodium: 142 mmol/L (ref 135–145)

## 2022-01-05 MED ORDER — ONDANSETRON 4 MG PO TBDP: 4.0000 mg | ORAL_TABLET | Freq: Four times a day (QID) | ORAL | 0 refills | Status: DC | PRN

## 2022-01-05 MED ORDER — HYDROCODONE-ACETAMINOPHEN 5-325 MG PO TABS: 1.0000 | ORAL_TABLET | ORAL | 0 refills | Status: DC | PRN

## 2022-01-05 NOTE — Discharge Summary (Signed)
Physician Discharge Summary  ?Patient ID: ?Tonya Graves ?MRN: [id number] ?DOB/AGE: [birth year] 71 y.o. ? ?PCP: [name], PA-C ? ?Admit date: 01/03/2022 ?Discharge date: 01/05/2022 ? ?Admission Diagnoses:  large hiatal hernia with GERD ? ?Discharge Diagnoses:  same  ?Principal Problem: ?  Status post laparoscopic Nissen fundoplication ? ? ?Surgery:  robotic Xi repair of hiatal hernia with Nissen fundoplication over a 56 bougie ? ?Discharged Condition: improved ? ?Hospital Course:   Surgery performed on Monday pm.  Patient slowly advanced on clears and then to full liquids.  Maintained on IV fluids.  Eliquis held.  HG checked before discharge and was stable.  Incisions OK ? ?Consults: none ? ?Significant Diagnostic Studies: none ? ? ? ?Discharge Exam: ?Blood pressure 134/69, pulse 71, temperature 98.1 ?F (36.7 ?C), resp. rate 16, height '5\' 1"'$  (1.549 m), weight 89.5 kg, SpO2 98 %. ?Incisions OK.   ? ?Disposition: Discharge disposition: 01-Home or Self Care ? ? ? ? ?She will start back on her Eliquis tomorrow since she has had multiple strokes and is in atrial fibrillation.   ? ?Discharge Instructions   ? ? Call MD for:  redness, tenderness, or signs of infection (pain, swelling, redness, odor or green/yellow discharge around incision site)   Complete by: As directed ?  ? Diet - low sodium heart healthy   Complete by: As directed ?  ? Increase activity slowly   Complete by: As directed ?  ? ?  ? ?Allergies as of 01/05/2022   ?No Known Allergies ?  ? ?  ?Medication List  ?  ? ?TAKE these medications   ? ?atorvastatin 80 MG tablet ?Commonly known as: LIPITOR ?TAKE 1 TABLET BY MOUTH EVERY DAY ?  ?Benefiber Powd ?Take 1 Scoop by mouth at bedtime. ?  ?CALCIUM 500 + D PO ?Take 1 tablet by mouth 2 (two) times daily. ?  ?cyclobenzaprine 5 MG tablet ?Commonly known as: FLEXERIL ?Take 1 tablet (5 mg total) by mouth at bedtime. ?What changed:  ?when to take this ?reasons to take this ?  ?Eliquis 5 MG Tabs tablet ?Generic drug:  apixaban ?TAKE 1 TABLET BY MOUTH TWICE A DAY ?  ?HYDROcodone-acetaminophen 5-325 MG tablet ?Commonly known as: NORCO/VICODIN ?Take 1 tablet by mouth every 4 (four) hours as needed for moderate pain or severe pain. ?  ?loratadine 10 MG tablet ?Commonly known as: CLARITIN ?Take 10 mg by mouth at bedtime. ?  ?metoprolol tartrate 25 MG tablet ?Commonly known as: LOPRESSOR ?TAKE 1 TABLET (25 MG TOTAL) BY MOUTH AS NEEDED (FOR PALPITATIONS). ?What changed: See the new instructions. ?  ?ondansetron 4 MG disintegrating tablet ?Commonly known as: ZOFRAN-ODT ?Take 1 tablet (4 mg total) by mouth every 6 (six) hours as needed for nausea. ?  ?pantoprazole 40 MG tablet ?Commonly known as: PROTONIX ?TAKE 1 TABLET BY MOUTH EVERY DAY ?  ?SYSTANE COMPLETE OP ?Place 1 drop into both eyes daily. ?  ?Vitamin D (Ergocalciferol) 1.25 MG (50000 UNIT) Caps capsule ?Commonly known as: DRISDOL ?TAKE 1 CAPSULE BY MOUTH ONE TIME PER WEEK ?What changed: See the new instructions. ?  ? ?  ? ? ? ?Signed: ?[name] ?01/05/2022, 10:26 AM ?  ? ?

## 2022-01-05 NOTE — Progress Notes (Signed)
Discharge instructions given to patient and all questions were answered.  

## 2022-01-13 ENCOUNTER — Ambulatory Visit (INDEPENDENT_AMBULATORY_CARE_PROVIDER_SITE_OTHER): Payer: PPO

## 2022-01-13 DIAGNOSIS — I639 Cerebral infarction, unspecified: Secondary | ICD-10-CM

## 2022-01-18 LAB — CUP PACEART REMOTE DEVICE CHECK
Date Time Interrogation Session: 20230516072647
Implantable Pulse Generator Implant Date: 20220214

## 2022-01-20 NOTE — Progress Notes (Signed)
Carelink Summary Report / Loop Recorder 

## 2022-02-14 ENCOUNTER — Ambulatory Visit (INDEPENDENT_AMBULATORY_CARE_PROVIDER_SITE_OTHER): Payer: PPO

## 2022-02-14 DIAGNOSIS — I639 Cerebral infarction, unspecified: Secondary | ICD-10-CM

## 2022-02-18 LAB — CUP PACEART REMOTE DEVICE CHECK
Date Time Interrogation Session: 20230616062009
Implantable Pulse Generator Implant Date: 20220214

## 2022-02-22 ENCOUNTER — Other Ambulatory Visit: Payer: Self-pay | Admitting: Physician Assistant

## 2022-03-09 NOTE — Progress Notes (Signed)
Carelink Summary Report / Loop Recorder 

## 2022-03-10 NOTE — Progress Notes (Signed)
NEUROLOGY FOLLOW UP OFFICE NOTE  [name] [id number]  Assessment/Plan:   History of transient ischemic attack x 2  History of left PCA territory infarct, likely cardioembolic Paroxysmal atrial fibrillation Hyperlipidemia   Secondary stroke prevention as managed by PCP/cardiology Eliquis Statin.  LDL goal less than 70 Normotensive blood pressure Hgb A1c goal less than 7 Mediterranean diet Routine exercise Follow up as needed.      Subjective:  Tonya Graves. Tonya Graves is a 71 year old right-handed female with HLD and prior smoker who follows up for stroke.   UPDATE: Current medications:  Eliquis, atorvastatin '80mg'$ , Lopressor '25mg'$  BID   Labs in March showed LDL 61; labs in April showed Hgb A1c 6.1,    HISTORY:  On 07/11/2020, she developed acute onset of dizziness for a couple of minutes and then noted right visual field cut.  She has known paroxysmal sinus tachycardia.  For which she takes metoprolol as needed.  For several hours prior to this, she noted palpitations which felt more severe and did not respond as well to the metoprolol.  She was taken to [facility] where she received IV tPA.  CTA head and neck showed ischemic stroke in the left P2 territory as well as atheromatous changes in the carotid bifurcation but no significant intracranial or extracranial stenosis or large vessel occlusion.  She was transferred to [facility] for further care.  Echocardiogram showed EF >55% with mild mitral calcification but no cardiac source of embolus.  Hgb A1c was 5.6.  LDL was 111.  She was also started on ASA '81mg'$  and atorvastatin '80mg'$  daily.  Telemetry showed no significant arrhythmia but given history of palpitations, a fib was a concern.  She was started on a ZIO patch on discharge which subsequently demonstrated symptomatic brief episodes of paroxysmal atrial tachycardia but no atrial fibrillation.  Underwent implantable loop recorder placement in February 2022 which  subsequently demonstrated PAF and she was started on Eliquis.  On 05/24/2021, she woke up with numbness and tingling of the last 2 fingers on her right hand as well as right lower facial tingling, lasting an hour.  No headache, visual changes or weakness.  She went to the ED at [facility] where CT head was reportedly negative.  She was diagnosed with probable TIA.  LDL at that time was 58.      PAST MEDICAL HISTORY: Past Medical History:  Diagnosis Date   Arthritis    OA   Atrial fibrillation ([place])    Bronchitis    Cataracts, bilateral    Dysrhythmia    history Vtac   GERD (gastroesophageal reflux disease)    Heart palpitations 2016 august   High cholesterol    History of hiatal hernia    Osteopenia    Prediabetes    Stroke [facility])    Urine frequency    UTI (urinary tract infection)    Varicose veins    Ventricular tachycardia (paroxysmal) ([place]) 08/2019   30 day monitor.    MEDICATIONS: Current Outpatient Medications on File Prior to Visit  Medication Sig Dispense Refill   atorvastatin (LIPITOR) 80 MG tablet TAKE 1 TABLET BY MOUTH EVERY DAY 90 tablet 1   Calcium Carbonate-Vitamin D (CALCIUM 500 + D PO) Take 1 tablet by mouth 2 (two) times daily.     cyclobenzaprine (FLEXERIL) 5 MG tablet Take 1 tablet (5 mg total) by mouth at bedtime. (Patient taking differently: Take 5 mg by mouth at bedtime as needed for muscle spasms.)  30 tablet 1   ELIQUIS 5 MG TABS tablet TAKE 1 TABLET BY MOUTH TWICE A DAY 180 tablet 1   HYDROcodone-acetaminophen (NORCO/VICODIN) 5-325 MG tablet Take 1 tablet by mouth every 4 (four) hours as needed for moderate pain or severe pain. 20 tablet 0   loratadine (CLARITIN) 10 MG tablet Take 10 mg by mouth at bedtime.     metoprolol tartrate (LOPRESSOR) 25 MG tablet TAKE 1 TABLET (25 MG TOTAL) BY MOUTH AS NEEDED (FOR PALPITATIONS). (Patient taking differently: Take 25 mg by mouth 2 (two) times daily.) 90 tablet 1   ondansetron (ZOFRAN-ODT) 4 MG disintegrating tablet  Take 1 tablet (4 mg total) by mouth every 6 (six) hours as needed for nausea. 20 tablet 0   pantoprazole (PROTONIX) 40 MG tablet TAKE 1 TABLET BY MOUTH EVERY DAY 90 tablet 3   Propylene Glycol (SYSTANE COMPLETE OP) Place 1 drop into both eyes daily.     Vitamin D, Ergocalciferol, (DRISDOL) 1.25 MG (50000 UNIT) CAPS capsule TAKE 1 CAPSULE BY MOUTH ONE TIME PER WEEK (Patient taking differently: Take 50,000 Units by mouth every Friday.) 12 capsule 5   Wheat Dextrin (BENEFIBER) POWD Take 1 Scoop by mouth at bedtime.     No current facility-administered medications on file prior to visit.    ALLERGIES: No Known Allergies  FAMILY HISTORY: Family History  Problem Relation Age of Onset   High blood pressure Mother    Atrial fibrillation Mother    Stroke Father    Heart disease Father    High Cholesterol Father    Atrial fibrillation Sister    Cancer Maternal Grandmother    Leukemia Maternal Grandmother    Other Maternal Grandfather        rocky [place] spotted fever   Stroke Paternal Grandmother       Objective:  Blood pressure 124/78, pulse 63, height '5\' 1"'$  (1.549 m), weight 193 lb (87.5 kg), SpO2 100 %. General: No acute distress.  Patient appears well-groomed.   Head:  Normocephalic/atraumatic Eyes:  Fundi examined but not visualized Neck: supple, no paraspinal tenderness, full range of motion Heart:  Regular rate and rhythm Neurological Exam: alert and oriented to person, place, and time.  Speech fluent and not dysarthric, language intact.  CN II-XII intact. Bulk and tone normal, muscle strength 5/5 throughout.  Sensation to temperature and vibration intact.  Deep tendon reflexes 2+ throughout, toes downgoing.  Finger to nose testing intact.  Gait normal, Romberg negative.   Metta Clines, DO  CC: [name], PA-C

## 2022-03-11 ENCOUNTER — Ambulatory Visit: Payer: PPO | Admitting: Neurology

## 2022-03-11 ENCOUNTER — Encounter: Payer: Self-pay | Admitting: Neurology

## 2022-03-11 VITALS — BP 124/78 | HR 63 | Ht 61.0 in | Wt 193.0 lb

## 2022-03-11 DIAGNOSIS — I480 Paroxysmal atrial fibrillation: Secondary | ICD-10-CM | POA: Diagnosis not present

## 2022-03-11 DIAGNOSIS — E785 Hyperlipidemia, unspecified: Secondary | ICD-10-CM | POA: Diagnosis not present

## 2022-03-11 DIAGNOSIS — G459 Transient cerebral ischemic attack, unspecified: Secondary | ICD-10-CM | POA: Diagnosis not present

## 2022-03-11 DIAGNOSIS — I63432 Cerebral infarction due to embolism of left posterior cerebral artery: Secondary | ICD-10-CM

## 2022-03-11 NOTE — Patient Instructions (Signed)
Continue current medications as managed by your other providers: Eliquis Atorvastatin metoprolol

## 2022-03-17 ENCOUNTER — Ambulatory Visit (INDEPENDENT_AMBULATORY_CARE_PROVIDER_SITE_OTHER): Payer: PPO

## 2022-03-17 DIAGNOSIS — I639 Cerebral infarction, unspecified: Secondary | ICD-10-CM | POA: Diagnosis not present

## 2022-03-22 LAB — CUP PACEART REMOTE DEVICE CHECK
Date Time Interrogation Session: 20230713072910
Implantable Pulse Generator Implant Date: 20220214

## 2022-03-23 DIAGNOSIS — H2512 Age-related nuclear cataract, left eye: Secondary | ICD-10-CM | POA: Diagnosis not present

## 2022-04-01 NOTE — Progress Notes (Signed)
Carelink Summary Report / Loop Recorder 

## 2022-04-08 DIAGNOSIS — H25812 Combined forms of age-related cataract, left eye: Secondary | ICD-10-CM | POA: Diagnosis not present

## 2022-04-08 DIAGNOSIS — H52222 Regular astigmatism, left eye: Secondary | ICD-10-CM | POA: Diagnosis not present

## 2022-04-18 ENCOUNTER — Ambulatory Visit: Payer: PPO

## 2022-04-20 DIAGNOSIS — H2511 Age-related nuclear cataract, right eye: Secondary | ICD-10-CM | POA: Diagnosis not present

## 2022-04-22 DIAGNOSIS — H52201 Unspecified astigmatism, right eye: Secondary | ICD-10-CM | POA: Diagnosis not present

## 2022-04-22 DIAGNOSIS — H52221 Regular astigmatism, right eye: Secondary | ICD-10-CM | POA: Diagnosis not present

## 2022-04-22 DIAGNOSIS — H25811 Combined forms of age-related cataract, right eye: Secondary | ICD-10-CM | POA: Diagnosis not present

## 2022-04-25 LAB — CUP PACEART REMOTE DEVICE CHECK
Date Time Interrogation Session: 20230819230741
Implantable Pulse Generator Implant Date: 20220214

## 2022-05-04 ENCOUNTER — Telehealth: Payer: Self-pay

## 2022-05-04 NOTE — Telephone Encounter (Signed)
Patient calling as she had 5 minute episode yesterday afternoon with sweating and dizziness. Denies chest pain, chest tightness, feeling abnormal heart rhythm, facial droop, change in speech. She has felt fine since episode and began drinking water. BP and pulse were normal during and 5 minutes after episode.   Spoke with Gay Filler who recommends patient call cardiologist due to hx of abnormal heart rhythms.   Patient made aware. Patient will call cardiology.   Royce Macadamia, [place] 05/04/22 10:50 AM

## 2022-05-05 ENCOUNTER — Telehealth: Payer: Self-pay | Admitting: Cardiovascular Disease

## 2022-05-05 NOTE — Telephone Encounter (Signed)
Called spoke to patient - she states that last Tuesday she had  episode  - sweating profusely , no chest pain . No palp,  b/p 126/59 ,69, 113/53 67   No issue since then.  Patient states she spoke to Primary nurse- they informed her to contact cardiology. Patient states she has an appointment with primary on 05/20/22. RN offered an appointment with cardiology  with  APP or Dr Gwenlyn Found on 05/25/22.  Patient would  like to continue monitor and contact office  once she see primary.

## 2022-05-05 NOTE — Telephone Encounter (Signed)
Patient called to talk with Dr. Gwenlyn Found or nurse in regards to some sweats she has been experiencing. PCP advised her to talk with cardiologist

## 2022-05-20 ENCOUNTER — Ambulatory Visit (INDEPENDENT_AMBULATORY_CARE_PROVIDER_SITE_OTHER): Payer: PPO | Admitting: Physician Assistant

## 2022-05-20 ENCOUNTER — Encounter: Payer: Self-pay | Admitting: Physician Assistant

## 2022-05-20 VITALS — BP 120/70 | HR 66 | Temp 97.3°F | Ht 61.0 in | Wt 197.0 lb

## 2022-05-20 DIAGNOSIS — E782 Mixed hyperlipidemia: Secondary | ICD-10-CM | POA: Diagnosis not present

## 2022-05-20 DIAGNOSIS — E559 Vitamin D deficiency, unspecified: Secondary | ICD-10-CM

## 2022-05-20 DIAGNOSIS — N958 Other specified menopausal and perimenopausal disorders: Secondary | ICD-10-CM | POA: Diagnosis not present

## 2022-05-20 DIAGNOSIS — Z23 Encounter for immunization: Secondary | ICD-10-CM | POA: Diagnosis not present

## 2022-05-20 DIAGNOSIS — I4729 Other ventricular tachycardia: Secondary | ICD-10-CM

## 2022-05-20 DIAGNOSIS — M722 Plantar fascial fibromatosis: Secondary | ICD-10-CM | POA: Diagnosis not present

## 2022-05-20 NOTE — Progress Notes (Signed)
Subjective:  Patient ID: Tonya Graves, female    DOB: 03/17/1951  Age: 71 y.o. MRN: [id number]  Chief Complaint  Patient presents with   Hyperlipidemia    HPI  Mixed hyperlipidemia  Pt presents with hyperlipidemia.  Compliance with treatment has been good The patient is compliant with medications, maintains a low cholesterol diet , follows up as directed , and maintains an exercise regimen . The patient denies experiencing any hypercholesterolemia related symptoms. She is currently on lipitor '80mg'$  qd  Pt with history of atrial fibrillation (and episode of vtach in past as well) and follows with cardiology - she recently had surgery on her stomach (Nissen fundoplication) and has felt like her rhythm has improved since having that done.  She states she was to follow up with cardiologist and rhythm specialist after this procedure  She currently is taking lopressor '25mg'$   Pt with history of vit D def - due for labwork - is taking weekly supplement  Pt would like to be scheduled for dexa scan  Pt would like flu shot today  Pt has a history of plantar fasciitis and states her feet have been hurting her a lot lately and would like referral to podiatry  Current Outpatient Medications on File Prior to Visit  Medication Sig Dispense Refill   atorvastatin (LIPITOR) 80 MG tablet TAKE 1 TABLET BY MOUTH EVERY DAY 90 tablet 1   Calcium Carbonate-Vitamin D (CALCIUM 500 + D PO) Take 1 tablet by mouth 2 (two) times daily.     cyclobenzaprine (FLEXERIL) 5 MG tablet Take 1 tablet (5 mg total) by mouth at bedtime. (Patient taking differently: Take 5 mg by mouth at bedtime as needed for muscle spasms.) 30 tablet 1   ELIQUIS 5 MG TABS tablet TAKE 1 TABLET BY MOUTH TWICE A DAY 180 tablet 1   loratadine (CLARITIN) 10 MG tablet Take 10 mg by mouth at bedtime.     metoprolol tartrate (LOPRESSOR) 25 MG tablet TAKE 1 TABLET (25 MG TOTAL) BY MOUTH AS NEEDED (FOR PALPITATIONS). (Patient taking differently: Take  25 mg by mouth 2 (two) times daily.) 90 tablet 1   pantoprazole (PROTONIX) 40 MG tablet TAKE 1 TABLET BY MOUTH EVERY DAY 90 tablet 3   Propylene Glycol (SYSTANE COMPLETE OP) Place 1 drop into both eyes daily.     Vitamin D, Ergocalciferol, (DRISDOL) 1.25 MG (50000 UNIT) CAPS capsule TAKE 1 CAPSULE BY MOUTH ONE TIME PER WEEK (Patient taking differently: Take 50,000 Units by mouth every Friday.) 12 capsule 5   Wheat Dextrin (BENEFIBER) POWD Take 1 Scoop by mouth at bedtime.     No current facility-administered medications on file prior to visit.   Past Medical History:  Diagnosis Date   Arthritis    OA   Atrial fibrillation ([place])    Bronchitis    Cataracts, bilateral    Dysrhythmia    history Vtac   GERD (gastroesophageal reflux disease)    Heart palpitations 2016 august   High cholesterol    History of hiatal hernia    Osteopenia    Prediabetes    Stroke [facility])    Urine frequency    UTI (urinary tract infection)    Varicose veins    Ventricular tachycardia (paroxysmal) ([place]) 08/2019   30 day monitor.   Past Surgical History:  Procedure Laterality Date    C SECTIONS     X 2   MOUTH SURGERY  2013   TENDON REPAIR Right  YEARS AGO  LITTLE FINGER   TOTAL KNEE ARTHROPLASTY Right 07/13/2015   Procedure: RIGHT TOTAL KNEE ARTHROPLASTY;  Surgeon: Gaynelle Arabian, MD;  Location: [facility];  Service: Orthopedics;  Laterality: Right;   TOTAL KNEE ARTHROPLASTY Left 06/06/2016   Procedure: LEFT TOTAL KNEE ARTHROPLASTY;  Surgeon: Gaynelle Arabian, MD;  Location: [facility];  Service: Orthopedics;  Laterality: Left;   XI ROBOTIC ASSISTED HIATAL HERNIA REPAIR N/A 01/03/2022   Procedure: XI ROBOTIC TAKEDOWN HIATAL HERNIA with fundoplication;  Surgeon: Johnathan Hausen, MD;  Location: [facility];  Service: General;  Laterality: N/A;    Family History  Problem Relation Age of Onset   High blood pressure Mother    Atrial fibrillation Mother    Stroke Father    Heart disease Father    High Cholesterol Father     Atrial fibrillation Sister    Cancer Maternal Grandmother    Leukemia Maternal Grandmother    Other Maternal Grandfather        rocky [place] spotted fever   Stroke Paternal Grandmother    Social History   Socioeconomic History   Marital status: Married    Spouse name: Not on file   Number of children: Not on file   Years of education: Not on file   Highest education level: Not on file  Occupational History   Not on file  Tobacco Use   Smoking status: Never   Smokeless tobacco: Never  Vaping Use   Vaping Use: Never used  Substance and Sexual Activity   Alcohol use: No   Drug use: No   Sexual activity: Not on file  Other Topics Concern   Not on file  Social History Narrative   Right handed   Caffeine 1 every week   One story home with husband   Social Determinants of Health   Financial Resource Strain: Low Risk  (05/17/2021)   Overall Financial Resource Strain (CARDIA)    Difficulty of Paying Living Expenses: Not hard at all  Food Insecurity: No Food Insecurity (05/17/2021)   Hunger Vital Sign    Worried About Running Out of Food in the Last Year: Never true    [place] in the Last Year: Never true  Transportation Needs: No Transportation Needs (05/17/2021)   PRAPARE - Hydrologist (Medical): No    Lack of Transportation (Non-Medical): No  Physical Activity: Inactive (05/17/2021)   Exercise Vital Sign    Days of Exercise per Week: 0 days    Minutes of Exercise per Session: 0 min  Stress: Stress Concern Present (05/17/2021)   [place]    Feeling of Stress : Rather much  Social Connections: Unknown (05/17/2021)   Social Connection and Isolation Panel [NHANES]    Frequency of Communication with Friends and Family: More than three times a week    Frequency of Social Gatherings with Friends and Family: More than three times a week    Attends Religious Services: Not on  Advertising copywriter or Organizations: Yes    Attends Music therapist: More than 4 times per year    Marital Status: Married    Review of Systems CONSTITUTIONAL: Negative for chills, fatigue, fever, unintentional weight gain and unintentional weight loss.  E/N/T: Negative for ear pain, nasal congestion and sore throat.  CARDIOVASCULAR: Negative for chest pain, dizziness, palpitations and pedal edema.  RESPIRATORY: Negative for recent cough and dyspnea.  GASTROINTESTINAL: Negative  for abdominal pain, acid reflux symptoms, constipation, diarrhea, nausea and vomiting.  MSK:see HPI  INTEGUMENTARY: Negative for rash.  NEUROLOGICAL: Negative for dizziness and headaches.  PSYCHIATRIC: Negative for sleep disturbance and to question depression screen.  Negative for depression, negative for anhedonia.       Objective:  PHYSICAL EXAM:   VS: BP 120/70 (BP Location: Left Arm, Patient Position: Sitting, Cuff Size: Normal)   Pulse 66   Temp (!) 97.3 F (36.3 C) (Temporal)   Ht '5\' 1"'$  (1.549 m)   Wt 197 lb (89.4 kg)   SpO2 96%   BMI 37.22 kg/m   GEN: Well nourished, well developed, in no acute distress  Cardiac: RRR; no murmurs, rubs, or gallops,no edema -  Respiratory:  normal respiratory rate and pattern with no distress - normal breath sounds with no rales, rhonchi, wheezes or rubs  MS: no deformity or atrophy  Skin: warm and dry, no rash   Psych: euthymic mood, appropriate affect and demeanor   Lab Results  Component Value Date   WBC 5.3 01/05/2022   HGB 11.1 (L) 01/05/2022   HCT 34.7 (L) 01/05/2022   PLT 145 (L) 01/05/2022   GLUCOSE 96 01/05/2022   CHOL 170 11/15/2021   TRIG 64 11/15/2021   HDL 97 11/15/2021   LDLCALC 61 11/15/2021   ALT 16 11/15/2021   AST 20 11/15/2021   NA 142 01/05/2022   K 4.1 01/05/2022   CL 111 01/05/2022   CREATININE 0.92 01/05/2022   BUN 14 01/05/2022   CO2 26 01/05/2022   TSH 2.340 11/15/2021   INR 1.03 05/27/2016    HGBA1C 6.1 (H) 12/16/2021      Assessment & Plan:   Problem List Items Addressed This Visit       Cardiovascular and Mediastinum   Ventricular tachycardia (paroxysmal) (HCC)   Relevant Orders   CBC with Differential/Platelet   Comprehensive metabolic panel   TSH Follow up with cardiology as directed     Other   Mixed hyperlipidemia   Relevant Orders   CBC with Differential/Platelet   Comprehensive metabolic panel   Lipid panel Continue current meds   Vitamin D insufficiency   Relevant Orders   VITAMIN D 25 Hydroxy (Vit-D Deficiency, Fractures) Continue med   Other Visit Diagnoses     Need for immunization against influenza    -  Primary   Relevant Orders   Flu Vaccine QUAD High Dose(Fluad) (Completed)   Other specified menopausal and perimenopausal disorders       Relevant Orders   DG Bone Density   Plantar fasciitis       Relevant Orders   Ambulatory referral to Podiatry     .  No orders of the defined types were placed in this encounter.   Orders Placed This Encounter  Procedures   DG Bone Density   Flu Vaccine QUAD High Dose(Fluad)   CBC with Differential/Platelet   Comprehensive metabolic panel   TSH   Lipid panel   VITAMIN D 25 Hydroxy (Vit-D Deficiency, Fractures)   Ambulatory referral to Podiatry     Follow-up: Return in about 6 months (around 11/18/2022) for chronic fasting follow up.  An After Visit Summary was printed and given to the patient.  Yetta Flock [facility] [phone]

## 2022-05-21 ENCOUNTER — Other Ambulatory Visit: Payer: Self-pay | Admitting: Physician Assistant

## 2022-05-21 DIAGNOSIS — R899 Unspecified abnormal finding in specimens from other organs, systems and tissues: Secondary | ICD-10-CM

## 2022-05-21 LAB — LIPID PANEL
Chol/HDL Ratio: 1.7 ratio (ref 0.0–4.4)
Cholesterol, Total: 171 mg/dL (ref 100–199)
HDL: 103 mg/dL (ref >39)
LDL Chol Calc (NIH): 61 mg/dL (ref 0–99)
Triglycerides: 29 mg/dL (ref 0–149)
VLDL Cholesterol Cal: 7 mg/dL (ref 5–40)

## 2022-05-21 LAB — COMPREHENSIVE METABOLIC PANEL
ALT: 696 IU/L (ref 0–32)
AST: 773 IU/L (ref 0–40)
Albumin/Globulin Ratio: 1.4 (ref 1.2–2.2)
Albumin: 3.9 g/dL (ref 3.9–4.9)
Alkaline Phosphatase: 185 IU/L — ABNORMAL HIGH (ref 44–121)
BUN/Creatinine Ratio: 28 (ref 12–28)
BUN: 25 mg/dL (ref 8–27)
Bilirubin Total: 0.4 mg/dL (ref 0.0–1.2)
CO2: 25 mmol/L (ref 20–29)
Calcium: 9.3 mg/dL (ref 8.7–10.3)
Chloride: 104 mmol/L (ref 96–106)
Creatinine, Ser: 0.88 mg/dL (ref 0.57–1.00)
Globulin, Total: 2.8 g/dL (ref 1.5–4.5)
Glucose: 100 mg/dL — ABNORMAL HIGH (ref 70–99)
Potassium: 4.7 mmol/L (ref 3.5–5.2)
Sodium: 140 mmol/L (ref 134–144)
Total Protein: 6.7 g/dL (ref 6.0–8.5)
eGFR: 71 mL/min/{1.73_m2} (ref >59)

## 2022-05-21 LAB — CBC WITH DIFFERENTIAL/PLATELET
Basophils Absolute: 0 10*3/uL (ref 0.0–0.2)
Basos: 1 %
EOS (ABSOLUTE): 0.2 10*3/uL (ref 0.0–0.4)
Eos: 4 %
Hematocrit: 34.8 % (ref 34.0–46.6)
Hemoglobin: 11.3 g/dL (ref 11.1–15.9)
Immature Grans (Abs): 0 10*3/uL (ref 0.0–0.1)
Immature Granulocytes: 0 %
Lymphocytes Absolute: 1.2 10*3/uL (ref 0.7–3.1)
Lymphs: 31 %
MCH: 28.7 pg (ref 26.6–33.0)
MCHC: 32.5 g/dL (ref 31.5–35.7)
MCV: 88 fL (ref 79–97)
Monocytes Absolute: 0.7 10*3/uL (ref 0.1–0.9)
Monocytes: 17 %
Neutrophils Absolute: 1.8 10*3/uL (ref 1.4–7.0)
Neutrophils: 47 %
Platelets: 211 10*3/uL (ref 150–450)
RBC: 3.94 x10E6/uL (ref 3.77–5.28)
RDW: 12.5 % (ref 11.7–15.4)
WBC: 3.8 10*3/uL (ref 3.4–10.8)

## 2022-05-21 LAB — CARDIOVASCULAR RISK ASSESSMENT

## 2022-05-21 LAB — VITAMIN D 25 HYDROXY (VIT D DEFICIENCY, FRACTURES): Vit D, 25-Hydroxy: 78.1 ng/mL (ref 30.0–100.0)

## 2022-05-21 LAB — TSH: TSH: 2.09 u[IU]/mL (ref 0.450–4.500)

## 2022-05-21 IMAGING — US US PELVIS COMPLETE WITH TRANSVAGINAL
1 series · 13 of 25 positions shown · non-contrast
Comparison: None

CLINICAL DATA: Postmenopausal bleeding since Tuesday November, 2020
intermittently

EXAM:
TRANSABDOMINAL AND TRANSVAGINAL ULTRASOUND OF PELVIS
TECHNIQUE: Both transabdominal and transvaginal ultrasound examinations of the
pelvis were performed. Transabdominal technique was performed for
global imaging of the pelvis including uterus, ovaries, adnexal
regions, and pelvic cul-de-sac. It was necessary to proceed with
endovaginal exam following the transabdominal exam to visualize the
uterus, endometrium, and ovaries. Transvaginal imaging limited by
bowel gas.

[Series 1: us pelvic complete with transvaginal · 13 of 73 slices shown]
[im 1/73]
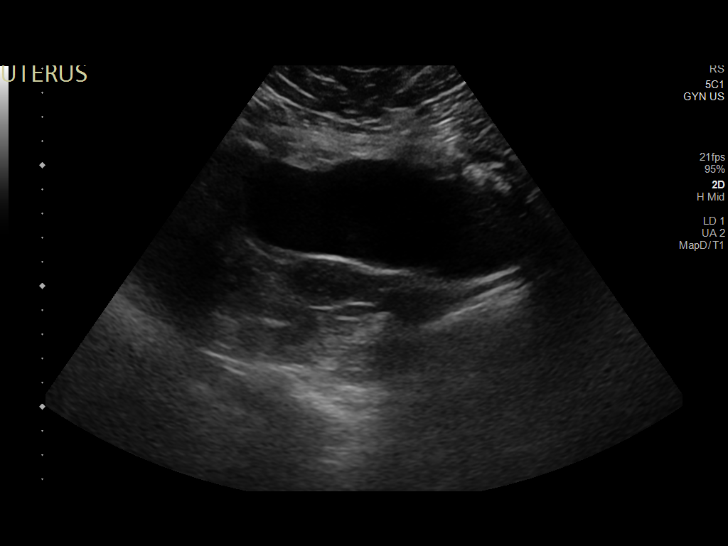
[im 7/73]
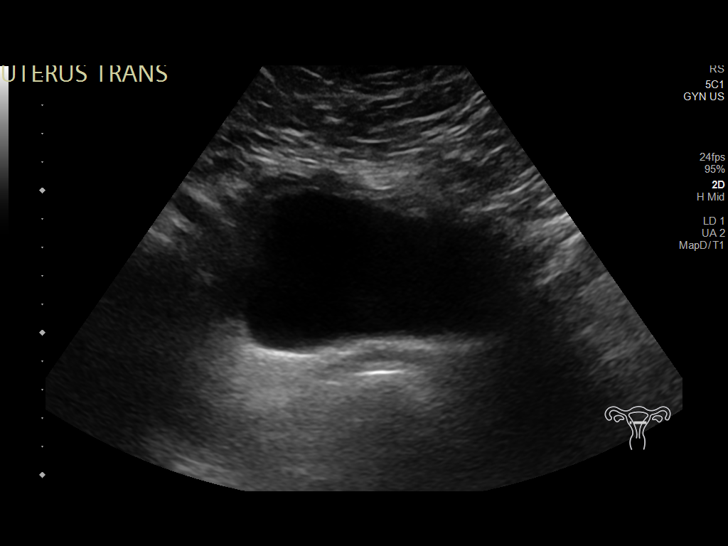
[im 13/73]
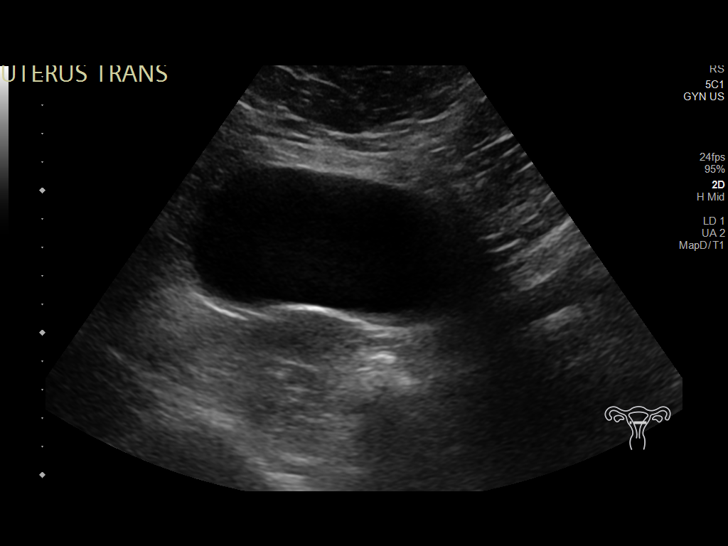
[im 19/73]
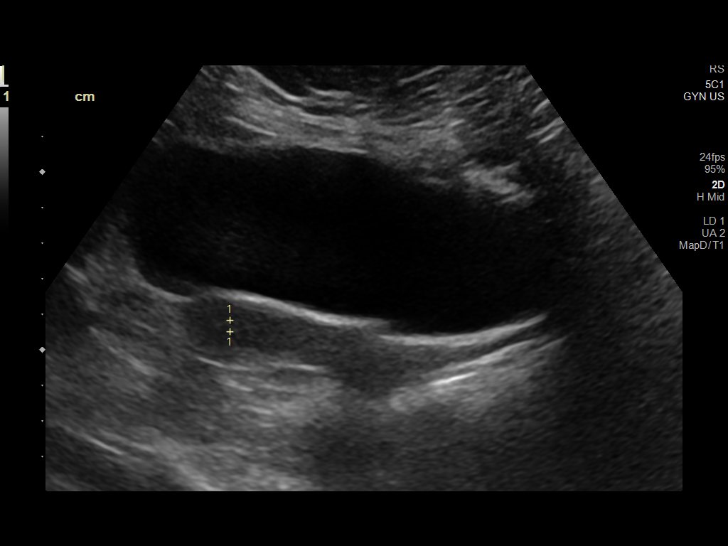
[im 25/73]
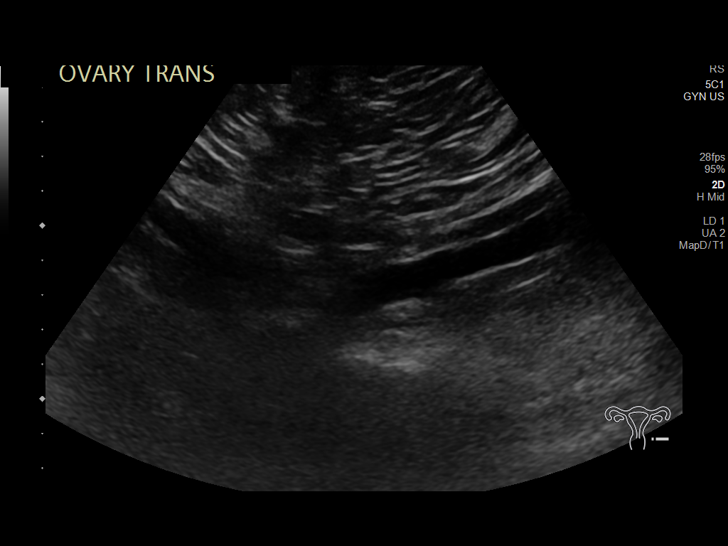
[im 31/73]
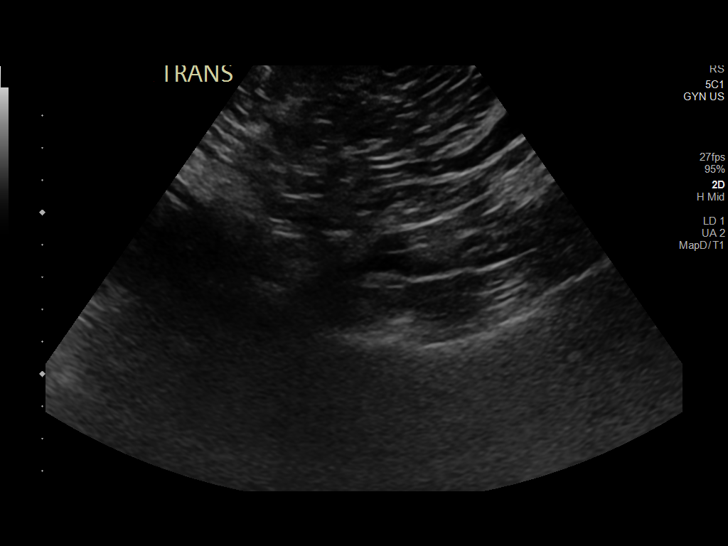
[im 37/73]
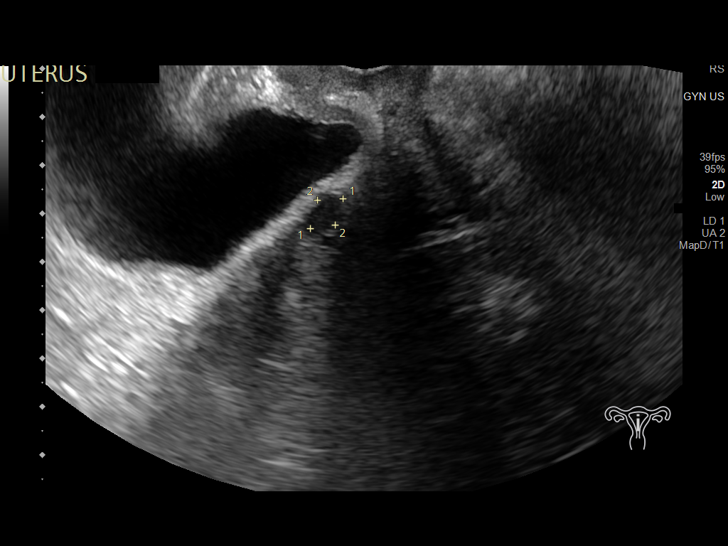
[im 43/73]
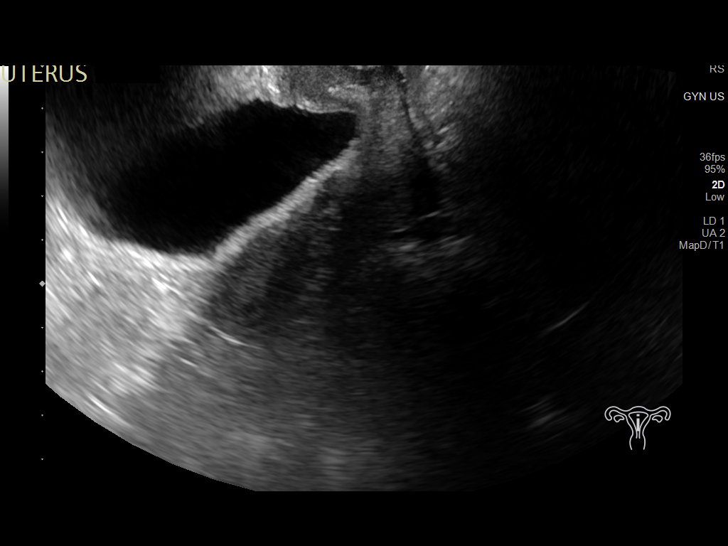
[im 49/73]
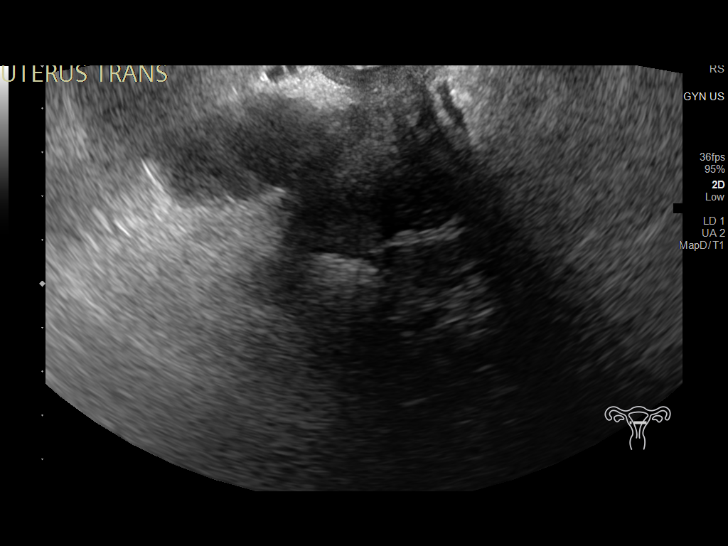
[im 55/73]
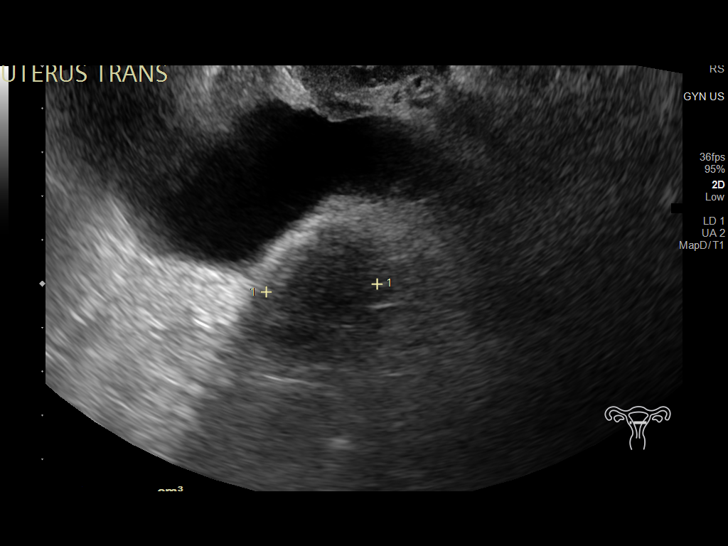
[im 61/73]
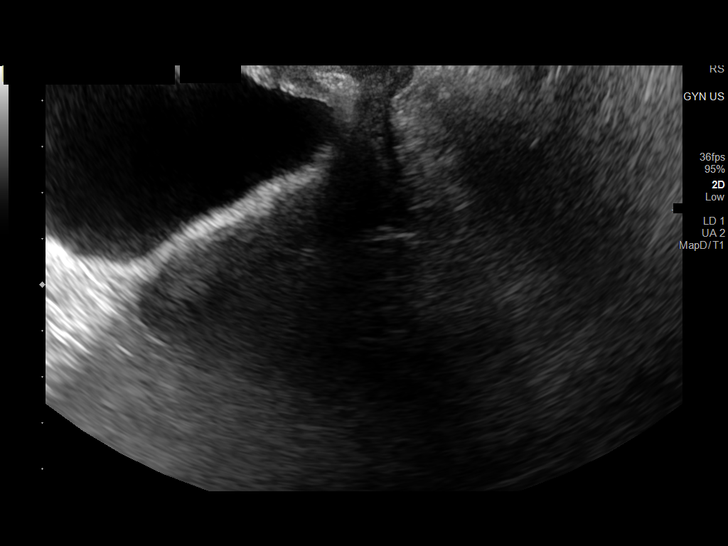
[im 67/73]
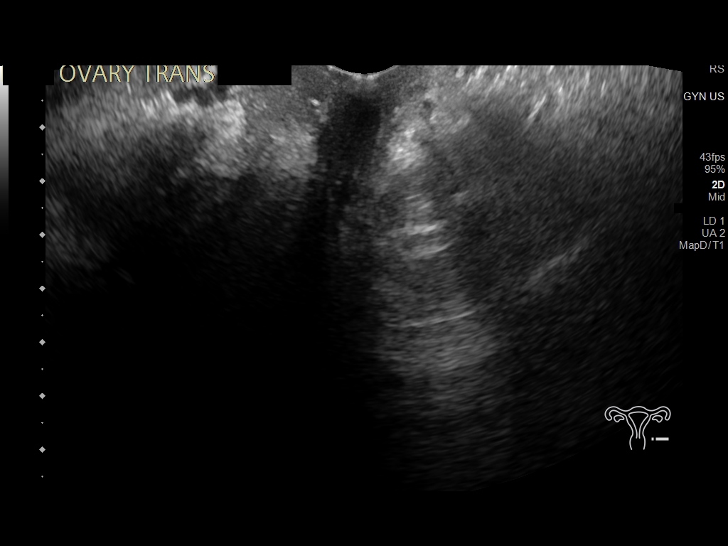
[im 73/73]
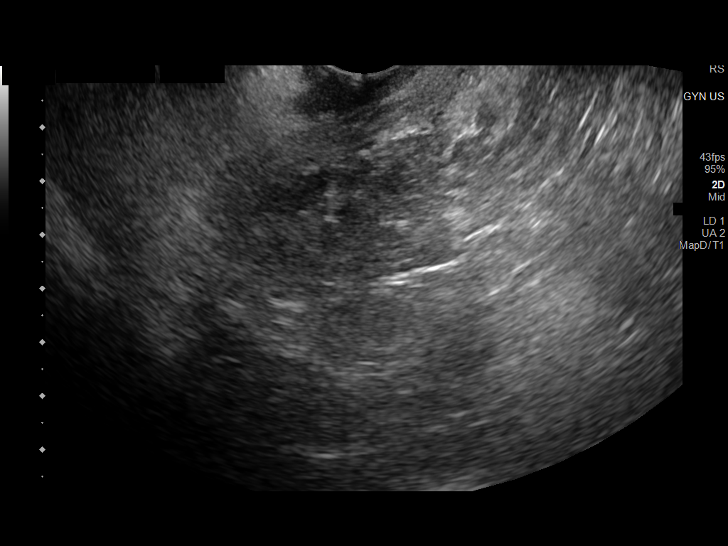

[13 of 25 positions shown; findings below may reference images not displayed]

FINDINGS: Uterus

Measurements: 6.3 x 1.8 x 2.9 cm = volume: 18 mL. Atrophic. Tiny
subserosal leiomyoma at anterior wall uterus 9 mm diameter. No
additional masses.

Endometrium

Thickness: 4 mm.  No endometrial fluid or mass

Right ovary

Not visualized, likely obscured by bowel

Left ovary

Not visualized, likely obscured by bowel

Other findings

No free pelvic fluid.  No adnexal masses.
IMPRESSION: Nonvisualization of ovaries.

Tiny subserosal leiomyoma at anterior wall of uterus 9 mm diameter.

Otherwise negative exam.

Specifically, endometrial complex normal in thickness and
appearance, 4 mm thick; in the setting of post-menopausal bleeding,
this is consistent with a benign etiology such as endometrial
atrophy. If bleeding remains unresponsive to hormonal or medical
therapy, sonohysterogram should be considered for focal lesion
work-up. (Ref: Radiological Reasoning: Algorithmic Workup of
Abnormal Vaginal Bleeding with Endovaginal Sonography and
Sonohysterography. AJR 0004; 191:S68-73)

## 2022-05-23 ENCOUNTER — Other Ambulatory Visit: Payer: Self-pay | Admitting: Physician Assistant

## 2022-05-23 ENCOUNTER — Other Ambulatory Visit: Payer: PPO

## 2022-05-23 DIAGNOSIS — R899 Unspecified abnormal finding in specimens from other organs, systems and tissues: Secondary | ICD-10-CM

## 2022-05-23 DIAGNOSIS — R748 Abnormal levels of other serum enzymes: Secondary | ICD-10-CM

## 2022-05-23 LAB — ACUTE VIRAL HEPATITIS (HAV, HBV, HCV)
HCV Ab: NONREACTIVE
Hep A IgM: NEGATIVE
Hep B C IgM: NEGATIVE
Hepatitis B Surface Ag: NEGATIVE

## 2022-05-23 LAB — COMPREHENSIVE METABOLIC PANEL
ALT: 921 IU/L (ref 0–32)
AST: 1074 IU/L (ref 0–40)
Albumin/Globulin Ratio: 1.3 (ref 1.2–2.2)
Albumin: 3.9 g/dL (ref 3.9–4.9)
Alkaline Phosphatase: 201 IU/L — ABNORMAL HIGH (ref 44–121)
BUN/Creatinine Ratio: 24 (ref 12–28)
BUN: 20 mg/dL (ref 8–27)
Bilirubin Total: 0.5 mg/dL (ref 0.0–1.2)
CO2: 24 mmol/L (ref 20–29)
Calcium: 9.1 mg/dL (ref 8.7–10.3)
Chloride: 103 mmol/L (ref 96–106)
Creatinine, Ser: 0.83 mg/dL (ref 0.57–1.00)
Globulin, Total: 2.9 g/dL (ref 1.5–4.5)
Glucose: 108 mg/dL — ABNORMAL HIGH (ref 70–99)
Potassium: 4.6 mmol/L (ref 3.5–5.2)
Sodium: 143 mmol/L (ref 134–144)
Total Protein: 6.8 g/dL (ref 6.0–8.5)
eGFR: 76 mL/min/{1.73_m2} (ref >59)

## 2022-05-23 LAB — HCV INTERPRETATION

## 2022-05-24 ENCOUNTER — Other Ambulatory Visit: Payer: Self-pay

## 2022-05-24 DIAGNOSIS — R748 Abnormal levels of other serum enzymes: Secondary | ICD-10-CM | POA: Diagnosis not present

## 2022-05-24 DIAGNOSIS — R945 Abnormal results of liver function studies: Secondary | ICD-10-CM | POA: Diagnosis not present

## 2022-05-25 ENCOUNTER — Other Ambulatory Visit: Payer: Self-pay | Admitting: Physician Assistant

## 2022-05-25 ENCOUNTER — Encounter: Payer: Self-pay | Admitting: Physician Assistant

## 2022-05-25 DIAGNOSIS — R799 Abnormal finding of blood chemistry, unspecified: Secondary | ICD-10-CM | POA: Diagnosis not present

## 2022-05-25 DIAGNOSIS — R791 Abnormal coagulation profile: Secondary | ICD-10-CM | POA: Diagnosis not present

## 2022-05-25 DIAGNOSIS — R7401 Elevation of levels of liver transaminase levels: Secondary | ICD-10-CM | POA: Diagnosis not present

## 2022-05-26 ENCOUNTER — Ambulatory Visit: Payer: PPO | Admitting: Physician Assistant

## 2022-05-26 DIAGNOSIS — R945 Abnormal results of liver function studies: Secondary | ICD-10-CM | POA: Diagnosis not present

## 2022-05-26 DIAGNOSIS — R935 Abnormal findings on diagnostic imaging of other abdominal regions, including retroperitoneum: Secondary | ICD-10-CM | POA: Diagnosis not present

## 2022-05-27 ENCOUNTER — Ambulatory Visit (INDEPENDENT_AMBULATORY_CARE_PROVIDER_SITE_OTHER): Payer: PPO

## 2022-05-27 DIAGNOSIS — I639 Cerebral infarction, unspecified: Secondary | ICD-10-CM | POA: Diagnosis not present

## 2022-05-27 LAB — CUP PACEART REMOTE DEVICE CHECK
Date Time Interrogation Session: 20230922095945
Implantable Pulse Generator Implant Date: 20220214

## 2022-06-01 DIAGNOSIS — Z961 Presence of intraocular lens: Secondary | ICD-10-CM | POA: Diagnosis not present

## 2022-06-02 DIAGNOSIS — R748 Abnormal levels of other serum enzymes: Secondary | ICD-10-CM | POA: Diagnosis not present

## 2022-06-02 DIAGNOSIS — K5790 Diverticulosis of intestine, part unspecified, without perforation or abscess without bleeding: Secondary | ICD-10-CM | POA: Diagnosis not present

## 2022-06-02 DIAGNOSIS — K219 Gastro-esophageal reflux disease without esophagitis: Secondary | ICD-10-CM | POA: Diagnosis not present

## 2022-06-03 NOTE — Progress Notes (Signed)
Carelink Summary Report / Loop Recorder 

## 2022-06-06 ENCOUNTER — Ambulatory Visit: Payer: PPO | Admitting: Podiatry

## 2022-06-08 DIAGNOSIS — R7989 Other specified abnormal findings of blood chemistry: Secondary | ICD-10-CM | POA: Diagnosis not present

## 2022-06-08 DIAGNOSIS — R945 Abnormal results of liver function studies: Secondary | ICD-10-CM | POA: Diagnosis not present

## 2022-06-09 DIAGNOSIS — K219 Gastro-esophageal reflux disease without esophagitis: Secondary | ICD-10-CM | POA: Diagnosis not present

## 2022-06-09 DIAGNOSIS — R748 Abnormal levels of other serum enzymes: Secondary | ICD-10-CM | POA: Diagnosis not present

## 2022-06-09 DIAGNOSIS — K5790 Diverticulosis of intestine, part unspecified, without perforation or abscess without bleeding: Secondary | ICD-10-CM | POA: Diagnosis not present

## 2022-06-10 ENCOUNTER — Other Ambulatory Visit: Payer: Self-pay

## 2022-06-10 MED ORDER — METOPROLOL TARTRATE 25 MG PO TABS: 25.0000 mg | ORAL_TABLET | Freq: Two times a day (BID) | ORAL | 1 refills | Status: DC | PRN

## 2022-06-24 ENCOUNTER — Ambulatory Visit: Payer: PPO | Admitting: Podiatry

## 2022-06-28 ENCOUNTER — Other Ambulatory Visit: Payer: Self-pay | Admitting: Physician Assistant

## 2022-06-28 ENCOUNTER — Other Ambulatory Visit: Payer: Self-pay | Admitting: General Practice

## 2022-06-28 NOTE — Telephone Encounter (Signed)
Prescription refill request for Eliquis received. Indication:Afib Last office visit:7/23 Scr:0.8 Age: 71 Weight:89.4 kg  Prescription refilled

## 2022-06-29 ENCOUNTER — Ambulatory Visit: Payer: PPO

## 2022-06-29 LAB — CUP PACEART REMOTE DEVICE CHECK
Date Time Interrogation Session: 20231024231006
Implantable Pulse Generator Implant Date: 20220214

## 2022-07-01 ENCOUNTER — Telehealth: Payer: Self-pay

## 2022-07-01 NOTE — Telephone Encounter (Signed)
Called Patient made aware, Verbalized understanding.

## 2022-07-01 NOTE — Telephone Encounter (Signed)
Patient called and stated she has been in the hospital with her husband, and her feet started swelling a few days ago she believes for sitting at the hospital. She is not able to come be seen today due to having to take care of her husband. Her husband as some lasix 20 mg  and she was wanting to know if it will be okay for her to take one of his pills to get some fluid off her feet. Please advise

## 2022-07-01 NOTE — Telephone Encounter (Signed)
With her current cardiac issues I would recommend she call and speak with her cardiologist before starting lasix Would recommend to elevate feet - compression hose - decrease salt in diet which all could help with swelling also

## 2022-07-28 IMAGING — CT CT CARDIAC CORONARY ARTERY CALCIUM SCORE
3 series · 14 of 20 positions shown, 16 images · non-contrast
Comparison: Radiograph 05/24/2016
COMPARISON: Radiograph 05/24/2016

Addendum:
EXAM:
OVER-READ INTERPRETATION  CT CHEST

The following report is an over-read performed by radiologist Dr.
Klpigbb Moolman [REDACTED] on 12/20/2021. This
over-read does not include interpretation of cardiac or coronary
anatomy or pathology. The calcium score interpretation by the
cardiologist is attached.
CLINICAL DATA: Cardiovascular Disease Risk stratification
Coronary Calcium Score
TECHNIQUE: A gated, non-contrast computed tomography scan of the heart was
performed using 3mm slice thickness. Axial images were analyzed on a
dedicated workstation. Calcium scoring of the coronary arteries was
performed using the Agatston method.

[Series 2: cascseq 2.0 sa36 70% (id) · axial · 0.39mm/px · z∈[-249,-159]mm · 4 of 76 slices shown]
[im 16/76  vessel]
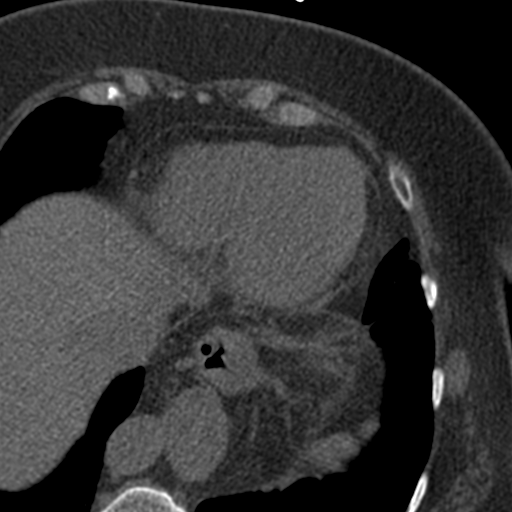
[im 31/76  vessel]
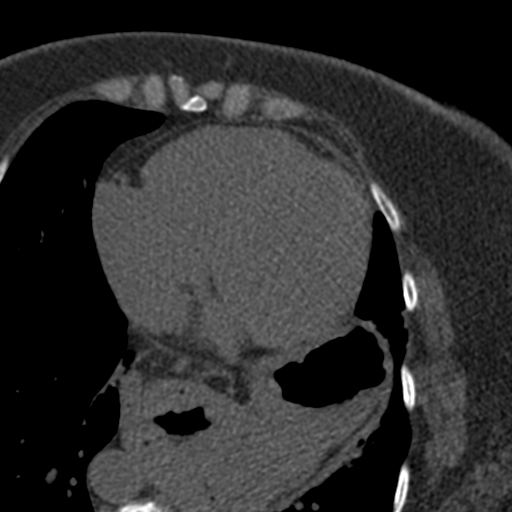
[im 46/76  vessel]
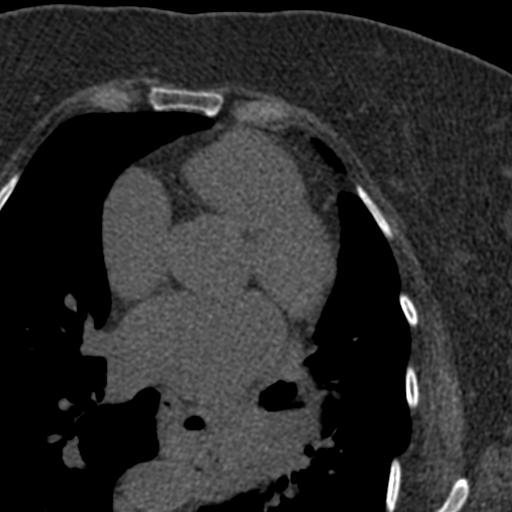
[im 61/76  vessel]
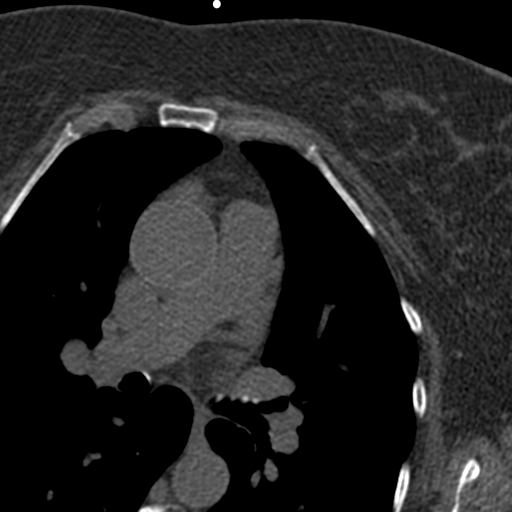

[Series 3: cascseq 2.0 bf37 st · axial · 0.55mm/px · z∈[-255,-155]mm · 5 of 76 slices shown, 7 images]
[im 13/76  vessel]
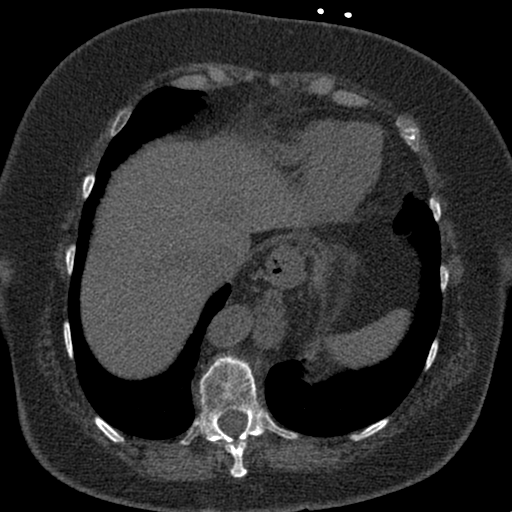
[im 13/76  lung]
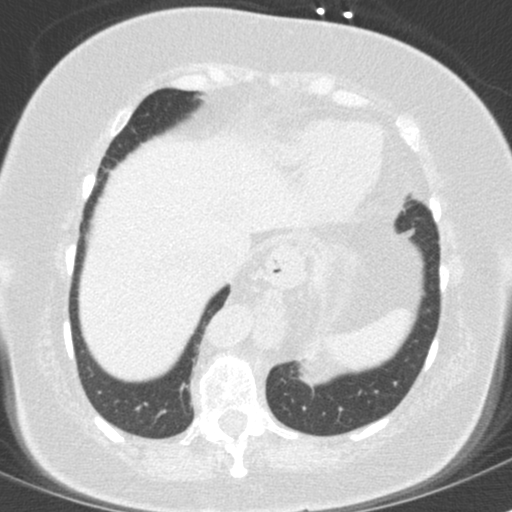
[im 26/76  vessel]
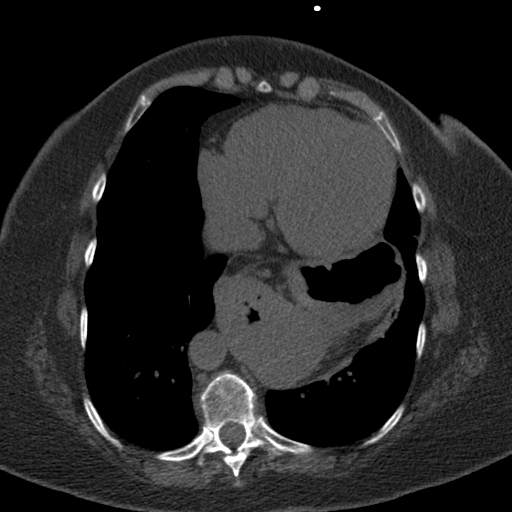
[im 38/76  vessel]
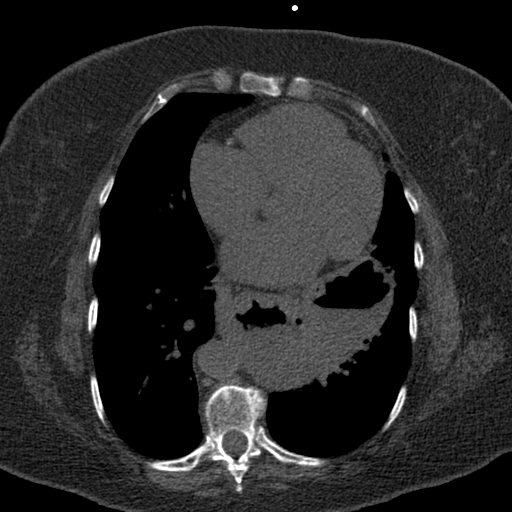
[im 51/76  vessel]
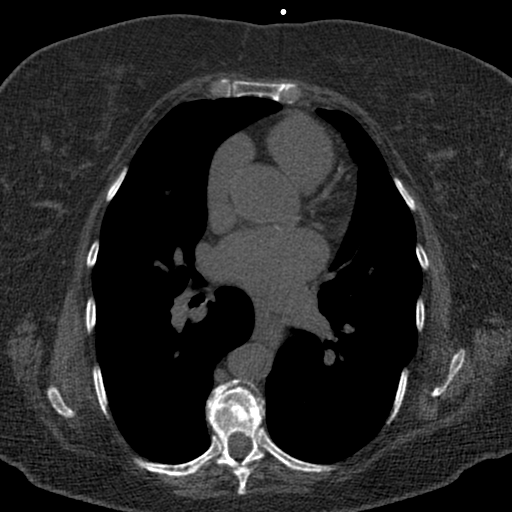
[im 63/76  vessel]
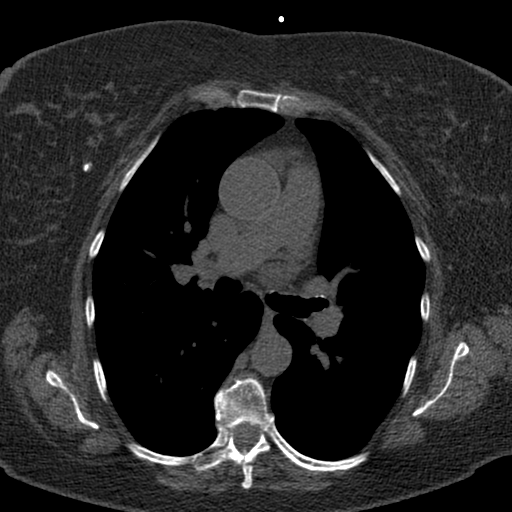
[im 63/76  lung]
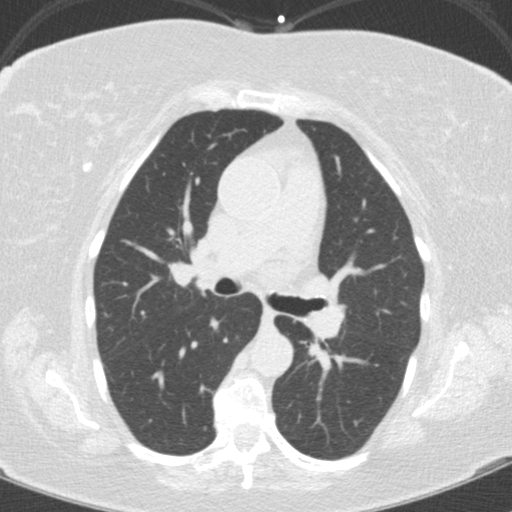

[Series 4: cascseq 2.0 br59 lung · axial · 0.55mm/px · z∈[-255,-155]mm · 5 of 76 slices shown]
[im 13/76  lung]
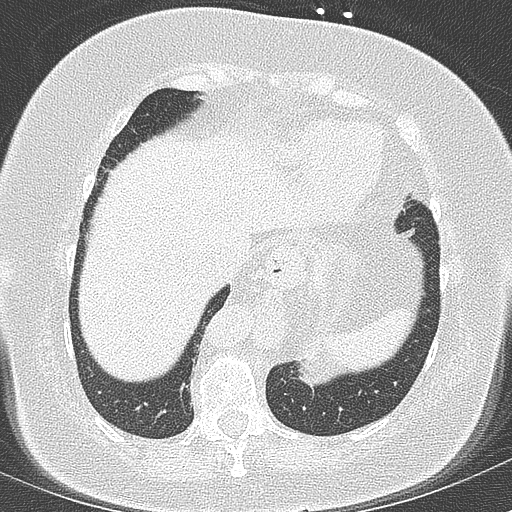
[im 26/76  lung]
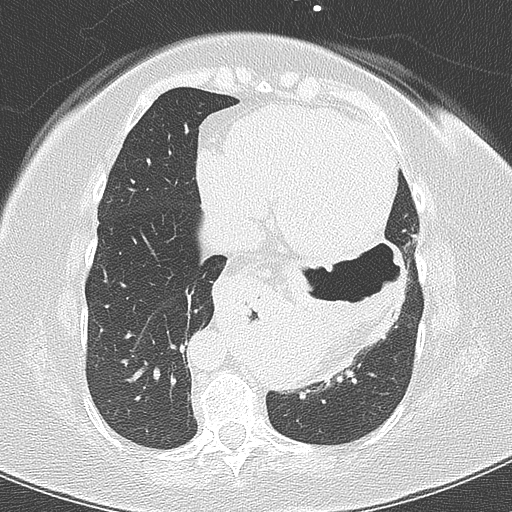
[im 38/76  lung]
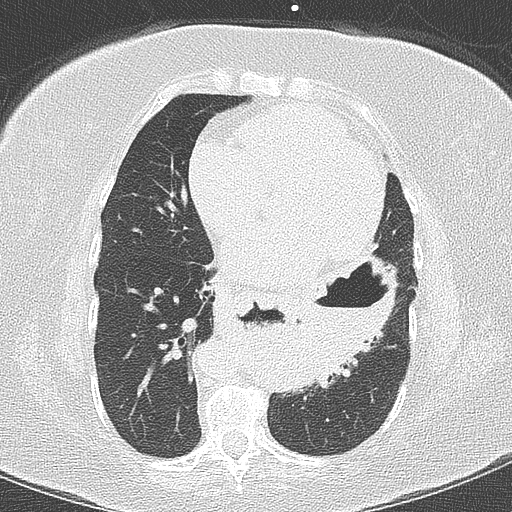
[im 51/76  lung]
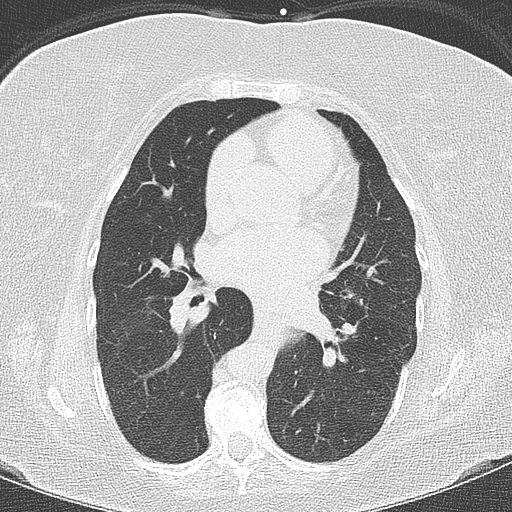
[im 63/76  lung]
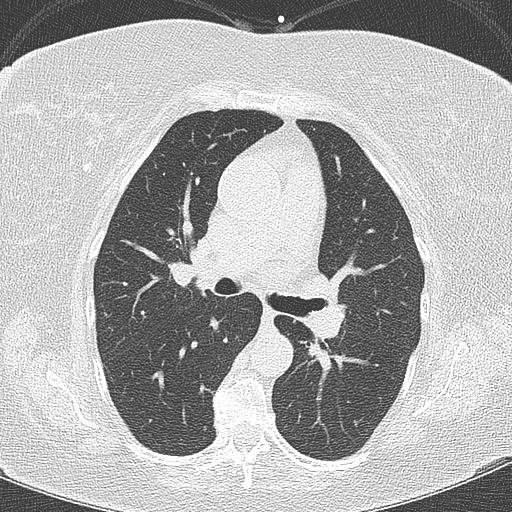

[14 of 20 positions shown; findings below may reference images not displayed]

FINDINGS: Limited view of the lung parenchyma demonstrates no suspicious
nodularity. Airways are normal.

Limited view of the mediastinum demonstrates no adenopathy.
Esophagus normal.

Limited view of the upper abdomen demonstrates a large hiatal hernia
with the near entirety of the stomach above the hemidiaphragms
posterior to the heart.

Limited view of the skeleton and chest wall is unremarkable.
IMPRESSION: Large sliding-type hiatal hernia with the near entirety of the
stomach in the middle mediastinum.
FINDINGS: Coronary arteries: Normal origins.

Coronary Calcium Score:

Left main: 0

Left anterior descending artery: 32

Left circumflex artery: 0

Right coronary artery: 1

Total: 33

Percentile: 58

Pericardium: Normal.

Aorta: Normal caliber of ascending aorta. No significant aortic
atherosclerosis noted.

Non-cardiac: See separate report from [REDACTED].
IMPRESSION: Coronary calcium score of 33. This was 58th percentile for age-,
race-, and sex-matched controls. Large hiatal hernia, see radiology
report.



If CAC=0, it is reasonable to withhold statin therapy and reassess
in 5 to 10 years, as long as higher risk conditions are absent
(diabetes mellitus, family history of premature CHD in first degree
relatives (males <55 years; females <65 years), cigarette smoking,
or LDL >=190 mg/dL).

If CAC is 1 to 99, it is reasonable to initiate statin therapy for
patients >=55 years of age.

If CAC is >=100 or >=75th percentile, it is reasonable to initiate
statin therapy at any age.

Cardiology referral should be considered for patients with CAC
scores >=400 or >=75th percentile.

*1933 AHA/ACC/AACVPR/AAPA/ABC/LUCRESIA/SEMAJ/AUJLA/Rohini/TIGER/VICERY/EEICK
Guideline on the Management of Blood Cholesterol: A Report of the
American College of Cardiology/American Heart Association Task Force
on Clinical Practice Guidelines. J Am Coll Cardiol.
7028;73(24):4227-4295.

*** End of Addendum ***
EXAM:
OVER-READ INTERPRETATION  CT CHEST

The following report is an over-read performed by radiologist Dr.
Klpigbb Moolman [REDACTED] on 12/20/2021. This
over-read does not include interpretation of cardiac or coronary
anatomy or pathology. The calcium score interpretation by the
cardiologist is attached.
FINDINGS: Limited view of the lung parenchyma demonstrates no suspicious
nodularity. Airways are normal.

Limited view of the mediastinum demonstrates no adenopathy.
Esophagus normal.

Limited view of the upper abdomen demonstrates a large hiatal hernia
with the near entirety of the stomach above the hemidiaphragms
posterior to the heart.

Limited view of the skeleton and chest wall is unremarkable.
IMPRESSION: Large sliding-type hiatal hernia with the near entirety of the
stomach in the middle mediastinum.

## 2022-08-01 ENCOUNTER — Ambulatory Visit (INDEPENDENT_AMBULATORY_CARE_PROVIDER_SITE_OTHER): Payer: PPO

## 2022-08-01 DIAGNOSIS — I639 Cerebral infarction, unspecified: Secondary | ICD-10-CM | POA: Diagnosis not present

## 2022-08-01 LAB — CUP PACEART REMOTE DEVICE CHECK
Date Time Interrogation Session: 20231126231950
Implantable Pulse Generator Implant Date: 20220214

## 2022-08-02 ENCOUNTER — Encounter: Payer: Self-pay | Admitting: Physician Assistant

## 2022-08-02 ENCOUNTER — Ambulatory Visit (INDEPENDENT_AMBULATORY_CARE_PROVIDER_SITE_OTHER): Payer: PPO | Admitting: Physician Assistant

## 2022-08-02 VITALS — BP 122/84 | HR 69 | Temp 97.6°F | Ht 61.0 in | Wt 201.6 lb

## 2022-08-02 DIAGNOSIS — R600 Localized edema: Secondary | ICD-10-CM

## 2022-08-02 DIAGNOSIS — I4729 Other ventricular tachycardia: Secondary | ICD-10-CM | POA: Diagnosis not present

## 2022-08-02 MED ORDER — FUROSEMIDE 20 MG PO TABS: 20.0000 mg | ORAL_TABLET | Freq: Every day | ORAL | 1 refills | Status: DC | PRN

## 2022-08-02 MED ORDER — POTASSIUM CHLORIDE CRYS ER 10 MEQ PO TBCR: 10.0000 meq | EXTENDED_RELEASE_TABLET | Freq: Every day | ORAL | 1 refills | Status: DC

## 2022-08-02 MED ORDER — METOPROLOL TARTRATE 25 MG PO TABS: 25.0000 mg | ORAL_TABLET | Freq: Two times a day (BID) | ORAL | 1 refills | Status: DC

## 2022-08-02 NOTE — Progress Notes (Signed)
Acute Office Visit  Subjective:    Patient ID: Tonya Graves, female    DOB: 1951-06-04, 71 y.o.   MRN: [id number]  Chief Complaint  Patient presents with   Foot Swelling    HPI: Patient is in today for complaints of bilateral swelling in both feet and ankles.  This has been going on for several weeks.  She noted after she had been spending lots of time at hospital with husband.  She denies leg pain  or erythema.  Denies chest pain or shortness of breath She does follow with cardiology regularly for PAF - is on eliquis and metoprolol  Past Medical History:  Diagnosis Date   Arthritis    OA   Atrial fibrillation ([place])    Bronchitis    Cataracts, bilateral    Dysrhythmia    history Vtac   GERD (gastroesophageal reflux disease)    Heart palpitations 2016 august   High cholesterol    History of hiatal hernia    Osteopenia    Prediabetes    Stroke [facility])    Urine frequency    UTI (urinary tract infection)    Varicose veins    Ventricular tachycardia (paroxysmal) ([place]) 08/2019   30 day monitor.    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   Procedure: RIGHT TOTAL KNEE ARTHROPLASTY;  Surgeon: Gaynelle Arabian, MD;  Location: [facility];  Service: Orthopedics;  Laterality: Right;   TOTAL KNEE ARTHROPLASTY Left 06/06/2016   Procedure: LEFT TOTAL KNEE ARTHROPLASTY;  Surgeon: Gaynelle Arabian, MD;  Location: [facility];  Service: Orthopedics;  Laterality: Left;   XI ROBOTIC ASSISTED HIATAL HERNIA REPAIR N/A 01/03/2022   Procedure: XI ROBOTIC TAKEDOWN HIATAL HERNIA with fundoplication;  Surgeon: Johnathan Hausen, MD;  Location: [facility];  Service: General;  Laterality: N/A;    Family History  Problem Relation Age of Onset   High blood pressure Mother    Atrial fibrillation Mother    Stroke Father    Heart disease Father    High Cholesterol  Father    Atrial fibrillation Sister    Cancer Maternal Grandmother    Leukemia Maternal Grandmother    Other Maternal Grandfather        rocky [place] spotted fever   Stroke Paternal Grandmother     Social History   Socioeconomic History   Marital status: Married    Spouse name: Not on file   Number of children: Not on file   Years of education: Not on file   Highest education level: Not on file  Occupational History   Not on file  Tobacco Use   Smoking status: Never   Smokeless tobacco: Never  Vaping Use   Vaping Use: Never used  Substance and Sexual Activity   Alcohol use: No   Drug use: No   Sexual activity: Not on file  Other Topics Concern   Not on file  Social History Narrative   Right handed   Caffeine 1 every week   One story home with husband   Social Determinants of Health   Financial Resource Strain: Low Risk  (05/17/2021)   Overall Financial Resource Strain (CARDIA)    Difficulty of Paying Living Expenses: Not hard at all  Food Insecurity: No Food Insecurity (05/17/2021)   Hunger Vital  Sign    Worried About Charity fundraiser in the Last Year: Never true    [place] in the Last Year: Never true  Transportation Needs: No Transportation Needs (05/17/2021)   PRAPARE - Hydrologist (Medical): No    Lack of Transportation (Non-Medical): No  Physical Activity: Inactive (05/17/2021)   Exercise Vital Sign    Days of Exercise per Week: 0 days    Minutes of Exercise per Session: 0 min  Stress: Stress Concern Present (05/17/2021)   [place]    Feeling of Stress : Rather much  Social Connections: Unknown (05/17/2021)   Social Connection and Isolation Panel [NHANES]    Frequency of Communication with Friends and Family: More than three times a week    Frequency of Social Gatherings with Friends and Family: More than three times a week    Attends Religious  Services: Not on file    Active Member of Clubs or Organizations: Yes    Attends Archivist Meetings: More than 4 times per year    Marital Status: Married  Human resources officer Violence: Not At Risk (05/17/2021)   Humiliation, Afraid, Rape, and Kick questionnaire    Fear of Current or Ex-Partner: No    Emotionally Abused: No    Physically Abused: No    Sexually Abused: No    Outpatient Medications Prior to Visit  Medication Sig Dispense Refill   Calcium Carbonate-Vitamin D (CALCIUM 500 + D PO) Take 1 tablet by mouth 2 (two) times daily.     cyclobenzaprine (FLEXERIL) 5 MG tablet Take 1 tablet (5 mg total) by mouth at bedtime. (Patient taking differently: Take 5 mg by mouth at bedtime as needed for muscle spasms.) 30 tablet 1   ELIQUIS 5 MG TABS tablet TAKE 1 TABLET BY MOUTH TWICE A DAY 180 tablet 1   loratadine (CLARITIN) 10 MG tablet Take 10 mg by mouth at bedtime.     pantoprazole (PROTONIX) 40 MG tablet TAKE 1 TABLET BY MOUTH EVERY DAY 90 tablet 3   Propylene Glycol (SYSTANE COMPLETE OP) Place 1 drop into both eyes daily.     Vitamin D, Ergocalciferol, (DRISDOL) 1.25 MG (50000 UNIT) CAPS capsule TAKE 1 CAPSULE BY MOUTH ONE TIME PER WEEK 12 capsule 5   Wheat Dextrin (BENEFIBER) POWD Take 1 Scoop by mouth at bedtime.     metoprolol tartrate (LOPRESSOR) 25 MG tablet Take 1 tablet (25 mg total) by mouth 2 (two) times daily as needed. TAKE 1 TABLET (25 MG TOTAL) BY MOUTH AS NEEDED (FOR PALPITATIONS). Strength: 25 mg 90 tablet 1   atorvastatin (LIPITOR) 80 MG tablet TAKE 1 TABLET BY MOUTH EVERY DAY 90 tablet 1   No facility-administered medications prior to visit.    No Known Allergies  Review of Systems CONSTITUTIONAL: Negative for chills, fatigue, fever,  CARDIOVASCULAR: Negative for chest pain, dizziness, palpitations  RESPIRATORY: Negative for recent cough and dyspnea.   INTEGUMENTARY: Negative for rash.       Objective:  PHYSICAL EXAM:   VS: BP 122/84 (BP Location:  Left Arm, Patient Position: Sitting, Cuff Size: Large)   Pulse 69   Temp 97.6 F (36.4 C) (Temporal)   Ht _0  (1.549 m)   Wt 201 lb 9.6 oz (91.4 kg)   SpO2 97%   BMI 38.09 kg/m   GEN: Well nourished, well developed, in no acute distress  Cardiac: RRR; no murmurs, rubs,  or gallops,1 + edema noted bilateral feet/ankles Respiratory:  normal respiratory rate and pattern with no distress - normal breath sounds with no rales, rhonchi, wheezes or rubs MS: no deformity or atrophy  Skin: warm and dry, no rash    Health Maintenance Due  Topic Date Due   Zoster Vaccines- Shingrix (1 of 2) Never done   DEXA SCAN  10/01/2020   COVID-19 Vaccine (6 - 2023-24 season) 05/06/2022   Medicare Annual Wellness (AWV)  05/17/2022    There are no preventive care reminders to display for this patient.   Lab Results  Component Value Date   TSH 2.090 05/20/2022   Lab Results  Component Value Date   WBC 3.8 05/20/2022   HGB 11.3 05/20/2022   HCT 34.8 05/20/2022   MCV 88 05/20/2022   PLT 211 05/20/2022   Lab Results  Component Value Date   NA 143 05/23/2022   K 4.6 05/23/2022   CO2 24 05/23/2022   GLUCOSE 108 (H) 05/23/2022   BUN 20 05/23/2022   CREATININE 0.83 05/23/2022   BILITOT 0.5 05/23/2022   ALKPHOS 201 (H) 05/23/2022   AST 1,074 (HH) 05/23/2022   ALT 921 (HH) 05/23/2022   PROT 6.8 05/23/2022   ALBUMIN 3.9 05/23/2022   CALCIUM 9.1 05/23/2022   ANIONGAP 5 01/05/2022   EGFR 76 05/23/2022   Lab Results  Component Value Date   CHOL 171 05/20/2022   Lab Results  Component Value Date   HDL 103 05/20/2022   Lab Results  Component Value Date   LDLCALC 61 05/20/2022   Lab Results  Component Value Date   TRIG 29 05/20/2022   Lab Results  Component Value Date   CHOLHDL 1.7 05/20/2022   Lab Results  Component Value Date   HGBA1C 6.1 (H) 12/16/2021       Assessment & Plan:   Problem List Items Addressed This Visit       Cardiovascular and Mediastinum    Ventricular tachycardia (paroxysmal) (HCC)   Relevant Medications   metoprolol tartrate (LOPRESSOR) 25 MG tablet   furosemide (LASIX) 20 MG tablet   Other Visit Diagnoses     Localized edema    -  Primary   Relevant Medications   furosemide (LASIX) 20 MG tablet   potassium chloride (KLOR-CON M) 10 MEQ tablet   Other Relevant Orders   Comprehensive metabolic panel   Pro b natriuretic peptide      Meds ordered this encounter  Medications   metoprolol tartrate (LOPRESSOR) 25 MG tablet    Sig: Take 1 tablet (25 mg total) by mouth 2 (two) times daily. 1 po bid    Dispense:  180 tablet    Refill:  1    Order Specific Question:   Supervising Provider    Answer:   Shelton Silvas   furosemide (LASIX) 20 MG tablet    Sig: Take 1 tablet (20 mg total) by mouth daily as needed for edema.    Dispense:  90 tablet    Refill:  1    Order Specific Question:   Supervising Provider    Answer:   COX, Lynder Parents   potassium chloride (KLOR-CON M) 10 MEQ tablet    Sig: Take 1 tablet (10 mEq total) by mouth daily. When taking furosemide    Dispense:  90 tablet    Refill:  1    Order Specific Question:   Supervising Provider    AnswerRochel Brome [phone]  Orders Placed This Encounter  Procedures   Comprehensive metabolic panel   Pro b natriuretic peptide     Follow-up: Return in about 4 weeks (around 08/30/2022) for follow up.  An After Visit Summary was printed and given to the patient.  Yetta Flock [facility] [phone]

## 2022-08-03 ENCOUNTER — Other Ambulatory Visit: Payer: Self-pay

## 2022-08-03 ENCOUNTER — Other Ambulatory Visit: Payer: Self-pay | Admitting: Physician Assistant

## 2022-08-03 ENCOUNTER — Telehealth: Payer: Self-pay

## 2022-08-03 DIAGNOSIS — I4729 Other ventricular tachycardia: Secondary | ICD-10-CM

## 2022-08-03 DIAGNOSIS — R945 Abnormal results of liver function studies: Secondary | ICD-10-CM | POA: Diagnosis not present

## 2022-08-03 LAB — COMPREHENSIVE METABOLIC PANEL
ALT: 16 IU/L (ref 0–32)
AST: 23 IU/L (ref 0–40)
Albumin/Globulin Ratio: 1.4 (ref 1.2–2.2)
Albumin: 4 g/dL (ref 3.9–4.9)
Alkaline Phosphatase: 71 IU/L (ref 44–121)
BUN/Creatinine Ratio: 25 (ref 12–28)
BUN: 19 mg/dL (ref 8–27)
Bilirubin Total: 0.3 mg/dL (ref 0.0–1.2)
CO2: 25 mmol/L (ref 20–29)
Calcium: 9.4 mg/dL (ref 8.7–10.3)
Chloride: 106 mmol/L (ref 96–106)
Creatinine, Ser: 0.76 mg/dL (ref 0.57–1.00)
Globulin, Total: 2.8 g/dL (ref 1.5–4.5)
Glucose: 102 mg/dL — ABNORMAL HIGH (ref 70–99)
Potassium: 4.4 mmol/L (ref 3.5–5.2)
Sodium: 142 mmol/L (ref 134–144)
Total Protein: 6.8 g/dL (ref 6.0–8.5)
eGFR: 84 mL/min/{1.73_m2} (ref >59)

## 2022-08-03 LAB — PRO B NATRIURETIC PEPTIDE: NT-Pro BNP: 391 pg/mL — ABNORMAL HIGH (ref 0–301)

## 2022-08-03 MED ORDER — METOPROLOL TARTRATE 25 MG PO TABS: 25.0000 mg | ORAL_TABLET | Freq: Two times a day (BID) | ORAL | 1 refills | Status: DC

## 2022-08-03 NOTE — Telephone Encounter (Signed)
Patient called and stated that she went to the pharmacy and her metoprolol was not sent in.  Please advise

## 2022-08-03 NOTE — Telephone Encounter (Signed)
Patient made aware.

## 2022-08-04 DIAGNOSIS — K219 Gastro-esophageal reflux disease without esophagitis: Secondary | ICD-10-CM | POA: Diagnosis not present

## 2022-08-04 DIAGNOSIS — R748 Abnormal levels of other serum enzymes: Secondary | ICD-10-CM | POA: Diagnosis not present

## 2022-08-04 DIAGNOSIS — K5790 Diverticulosis of intestine, part unspecified, without perforation or abscess without bleeding: Secondary | ICD-10-CM | POA: Diagnosis not present

## 2022-08-04 DIAGNOSIS — D649 Anemia, unspecified: Secondary | ICD-10-CM | POA: Diagnosis not present

## 2022-08-12 ENCOUNTER — Other Ambulatory Visit: Payer: Self-pay | Admitting: Cardiovascular Disease

## 2022-08-12 ENCOUNTER — Other Ambulatory Visit: Payer: Self-pay | Admitting: Physician Assistant

## 2022-08-12 DIAGNOSIS — I4729 Other ventricular tachycardia: Secondary | ICD-10-CM

## 2022-09-01 ENCOUNTER — Telehealth: Payer: Self-pay | Admitting: Physician Assistant

## 2022-09-01 NOTE — Telephone Encounter (Signed)
Left message for patient to call back and schedule Medicare Annual Wellness Visit (AWV) either virtually or phone   Left  my Tonya Graves number [phone]   Last AWV 05/17/21 please schedule with Nurse Health Adviser   45 min for awv-i and in office appointments 30 min for awv-s  phone/virtual appointments

## 2022-09-06 ENCOUNTER — Ambulatory Visit (INDEPENDENT_AMBULATORY_CARE_PROVIDER_SITE_OTHER): Payer: PPO

## 2022-09-06 DIAGNOSIS — I639 Cerebral infarction, unspecified: Secondary | ICD-10-CM | POA: Diagnosis not present

## 2022-09-06 LAB — CUP PACEART REMOTE DEVICE CHECK
Date Time Interrogation Session: 20240101232010
Implantable Pulse Generator Implant Date: 20220214

## 2022-09-07 ENCOUNTER — Encounter: Payer: Self-pay | Admitting: Physician Assistant

## 2022-09-07 ENCOUNTER — Ambulatory Visit (INDEPENDENT_AMBULATORY_CARE_PROVIDER_SITE_OTHER): Payer: PPO | Admitting: Physician Assistant

## 2022-09-07 VITALS — BP 128/68 | HR 56 | Temp 97.7°F | Ht 61.0 in | Wt 200.0 lb

## 2022-09-07 DIAGNOSIS — R600 Localized edema: Secondary | ICD-10-CM

## 2022-09-07 NOTE — Progress Notes (Signed)
Subjective:  Patient ID: Tonya Graves, female    DOB: Jul 15, 1951  Age: 72 y.o. MRN: [id number]  Chief Complaint  Patient presents with   Localized edema    4 week follow up    HPI  Pt in today for follow up of pedal edema.  She is using the lasix and potassium 2-3 times weekly and states it has been helping.  Her BNP was slightly elevated at last visit.  She states she is overdue for follow up with Dr Gwenlyn Found cardiologist and has not had echocardiogram in 'quite awhile' She denies chest pain/sob Pt states that she will call Dr Gwenlyn Found and make follow up appt Current Outpatient Medications on File Prior to Visit  Medication Sig Dispense Refill   Calcium Carbonate-Vitamin D (CALCIUM 500 + D PO) Take 1 tablet by mouth 2 (two) times daily.     cyclobenzaprine (FLEXERIL) 5 MG tablet Take 1 tablet (5 mg total) by mouth at bedtime. (Patient taking differently: Take 5 mg by mouth at bedtime as needed for muscle spasms.) 30 tablet 1   ELIQUIS 5 MG TABS tablet TAKE 1 TABLET BY MOUTH TWICE A DAY 180 tablet 1   furosemide (LASIX) 20 MG tablet Take 1 tablet (20 mg total) by mouth daily as needed for edema. 90 tablet 1   loratadine (CLARITIN) 10 MG tablet Take 10 mg by mouth at bedtime.     metoprolol tartrate (LOPRESSOR) 25 MG tablet Take 1 tablet (25 mg total) by mouth 2 (two) times daily. 1 po bid 180 tablet 1   pantoprazole (PROTONIX) 40 MG tablet TAKE 1 TABLET BY MOUTH EVERY DAY 90 tablet 3   potassium chloride (KLOR-CON M) 10 MEQ tablet Take 1 tablet (10 mEq total) by mouth daily. When taking furosemide 90 tablet 1   Propylene Glycol (SYSTANE COMPLETE OP) Place 1 drop into both eyes daily.     Vitamin D, Ergocalciferol, (DRISDOL) 1.25 MG (50000 UNIT) CAPS capsule TAKE 1 CAPSULE BY MOUTH ONE TIME PER WEEK 12 capsule 5   Wheat Dextrin (BENEFIBER) POWD Take 1 Scoop by mouth at bedtime.     No current facility-administered medications on file prior to visit.   Past Medical History:  Diagnosis Date    Arthritis    OA   Atrial fibrillation ([place])    Bronchitis    Cataracts, bilateral    Dysrhythmia    history Vtac   GERD (gastroesophageal reflux disease)    Heart palpitations 2016 august   High cholesterol    History of hiatal hernia    Osteopenia    Prediabetes    Stroke [facility])    Urine frequency    UTI (urinary tract infection)    Varicose veins    Ventricular tachycardia (paroxysmal) ([place]) 08/2019   30 day monitor.   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   Procedure: RIGHT TOTAL KNEE ARTHROPLASTY;  Surgeon: Gaynelle Arabian, MD;  [facility];  Service: Orthopedics;  Laterality: Right;   TOTAL KNEE ARTHROPLASTY Left 06/06/2016   Procedure: LEFT TOTAL KNEE ARTHROPLASTY;  Surgeon: Gaynelle Arabian, MD;  [facility];  Service: Orthopedics;  Laterality: Left;   XI ROBOTIC ASSISTED HIATAL HERNIA REPAIR N/A 01/03/2022   Procedure: XI ROBOTIC TAKEDOWN HIATAL HERNIA with fundoplication;  Surgeon: Hassell Done,  Rodman Key, MD;  [facility]: [facility];  Service: General;  Laterality: N/A;    Family History  Problem Relation Age of Onset   High blood pressure Mother    Atrial fibrillation Mother    Stroke Father    Heart disease Father    High Cholesterol Father    Atrial fibrillation Sister    Cancer Maternal Grandmother    Leukemia Maternal Grandmother    Other Maternal Grandfather        rocky [place] spotted fever   Stroke Paternal Grandmother    Social History   Socioeconomic History   Marital status: Married    Spouse name: Not on file   Number of children: Not on file   Years of education: Not on file   Highest education level: Not on file  Occupational History   Not on file  Tobacco Use   Smoking status: Never   Smokeless tobacco: Never  Vaping Use   Vaping Use: Never used  Substance and Sexual Activity   Alcohol use: No    Drug use: No   Sexual activity: Not on file  Other Topics Concern   Not on file  Social History Narrative   Right handed   Caffeine 1 every week   One story home with husband   Social Determinants of Health   Financial Resource Strain: Low Risk  (05/17/2021)   Overall Financial Resource Strain (CARDIA)    Difficulty of Paying Living Expenses: Not hard at all  Food Insecurity: No Food Insecurity (05/17/2021)   Hunger Vital Sign    Worried About Running Out of Food in the Last Year: Never true    [place] in the Last Year: Never true  Transportation Needs: No Transportation Needs (05/17/2021)   PRAPARE - Hydrologist (Medical): No    Lack of Transportation (Non-Medical): No  Physical Activity: Inactive (05/17/2021)   Exercise Vital Sign    Days of Exercise per Week: 0 days    Minutes of Exercise per Session: 0 min  Stress: Stress Concern Present (05/17/2021)   [place]    Feeling of Stress : Rather much  Social Connections: Unknown (05/17/2021)   Social Connection and Isolation Panel [NHANES]    Frequency of Communication with Friends and Family: More than three times a week    Frequency of Social Gatherings with Friends and Family: More than three times a week    Attends Religious Services: Not on Advertising copywriter or Organizations: Yes    Attends Music therapist: More than 4 times per year    Marital Status: Married    Review of Systems CONSTITUTIONAL: Negative for chills, fatigue, fever, CARDIOVASCULAR: Negative for chest pain,  RESPIRATORY: Negative for recent cough and dyspnea.  GASTROINTESTINAL: Negative for abdominal pain, acid reflux symptoms,   INTEGUMENTARY: Negative for rash.        Objective:  BP 128/68 (BP Location: Left Arm, Patient Position: Sitting)   Pulse (!) 56   Temp 97.7 F (36.5 C) (Temporal)   Ht '5\' 1"'$  (1.549 m)    Wt 200 lb (90.7 kg)   SpO2 97%   BMI 37.79 kg/m      09/07/2022    2:20 PM 08/02/2022    3:47 PM 05/20/2022    9:12 AM  BP/Weight  Systolic BP 081 448 185  Diastolic BP 68 84 70  Wt. (Lbs) 200  201.6 197  BMI 37.79 kg/m2 38.09 kg/m2 37.22 kg/m2    Physical Exam PHYSICAL EXAM:   VS: BP 128/68 (BP Location: Left Arm, Patient Position: Sitting)   Pulse (!) 56   Temp 97.7 F (36.5 C) (Temporal)   Ht '5\' 1"'$  (1.549 m)   Wt 200 lb (90.7 kg)   SpO2 97%   BMI 37.79 kg/m   GEN: Well nourished, well developed, in no acute distress  Cardiac: RRR; no murmurs, rubs, or gallops,no edema - Respiratory:  normal respiratory rate and pattern with no distress - normal breath sounds with no rales, rhonchi, wheezes or rubs Skin: warm and dry, no rash   Diabetic Foot Exam - Simple   No data filed      Lab Results  Component Value Date   WBC 3.8 05/20/2022   HGB 11.3 05/20/2022   HCT 34.8 05/20/2022   PLT 211 05/20/2022   GLUCOSE 102 (H) 08/02/2022   CHOL 171 05/20/2022   TRIG 29 05/20/2022   HDL 103 05/20/2022   LDLCALC 61 05/20/2022   ALT 16 08/02/2022   AST 23 08/02/2022   NA 142 08/02/2022   K 4.4 08/02/2022   CL 106 08/02/2022   CREATININE 0.76 08/02/2022   BUN 19 08/02/2022   CO2 25 08/02/2022   TSH 2.090 05/20/2022   INR 1.03 05/27/2016   HGBA1C 6.1 (H) 12/16/2021      Assessment & Plan:   Problem List Items Addressed This Visit   None Visit Diagnoses     Localized edema    -  Primary   Relevant Orders   Comprehensive metabolic panel Continue lasix as directed Make chronic follow up with cardiologist     .  No orders of the defined types were placed in this encounter.   Orders Placed This Encounter  Procedures   Comprehensive metabolic panel     Follow-up: Return for chronic appt 11/22/22.  An After Visit Summary was printed and given to the patient.  Yetta Flock [facility] [phone]

## 2022-09-08 ENCOUNTER — Telehealth (INDEPENDENT_AMBULATORY_CARE_PROVIDER_SITE_OTHER): Payer: PPO | Admitting: Family Medicine

## 2022-09-08 ENCOUNTER — Encounter: Payer: Self-pay | Admitting: Family Medicine

## 2022-09-08 ENCOUNTER — Telehealth: Payer: Self-pay | Admitting: Cardiology

## 2022-09-08 VITALS — Ht 61.0 in | Wt 200.0 lb

## 2022-09-08 DIAGNOSIS — Z0000 Encounter for general adult medical examination without abnormal findings: Secondary | ICD-10-CM

## 2022-09-08 LAB — COMPREHENSIVE METABOLIC PANEL
ALT: 14 IU/L (ref 0–32)
AST: 20 IU/L (ref 0–40)
Albumin/Globulin Ratio: 1.5 (ref 1.2–2.2)
Albumin: 4 g/dL (ref 3.9–4.9)
Alkaline Phosphatase: 73 IU/L (ref 44–121)
BUN/Creatinine Ratio: 25 (ref 12–28)
BUN: 22 mg/dL (ref 8–27)
Bilirubin Total: 0.2 mg/dL (ref 0.0–1.2)
CO2: 25 mmol/L (ref 20–29)
Calcium: 9.1 mg/dL (ref 8.7–10.3)
Chloride: 104 mmol/L (ref 96–106)
Creatinine, Ser: 0.87 mg/dL (ref 0.57–1.00)
Globulin, Total: 2.6 g/dL (ref 1.5–4.5)
Glucose: 95 mg/dL (ref 70–99)
Potassium: 4.2 mmol/L (ref 3.5–5.2)
Sodium: 141 mmol/L (ref 134–144)
Total Protein: 6.6 g/dL (ref 6.0–8.5)
eGFR: 72 mL/min/{1.73_m2} (ref >59)

## 2022-09-08 NOTE — Telephone Encounter (Signed)
Pt would like a callback regarding Loop Recorder. Please advise

## 2022-09-08 NOTE — Patient Instructions (Addendum)
I really enjoyed getting to talk with you today! I am available on Tuesdays and Thursdays for virtual visits if you have any questions or concerns, or if I can be of any further assistance.   CHECKLIST FROM ANNUAL WELLNESS VISIT:  -Follow up (please call to schedule if not scheduled after visit):  -Inperson visit with your Primary Doctor office: as planned -yearly for annual wellness visit with primary care office  Here is a list of your preventive care/health maintenance measures and the plan for each if any are due:  Health Maintenance  Topic Date Due   Zoster Vaccines- Shingrix (1 of 2) Never done   DEXA SCAN  10/01/2020   COVID-19 Vaccine (6 - 2023-24 season) 05/06/2022   MAMMOGRAM  11/23/2022   Medicare Annual Wellness ([place])  09/09/2023   COLONOSCOPY (Pts 45-35yr Insurance coverage will need to be confirmed)  03/04/2026   DTaP/Tdap/Td (2 - Td or Tdap) 03/23/2026   Pneumonia Vaccine 72 Years old  Completed   INFLUENZA VACCINE  Completed   HPV VACCINES  Aged Out   Hepatitis C Screening  Discontinued    -See a dentist at least yearly  -Get your eyes checked and then per your eye specialist's recommendations  -Other issues addressed today:  -I have included below further information regarding a healthy whole foods based diet, physical activity guidelines for adults, stress management and opportunities for social connections. I hope you find this information useful.     NUTRITION: -eat real food: lots of colorful vegetables (half the plate) and fruits -5-7 servings of vegetables and fruits per day (fresh or steamed is best), exp. 2 servings of vegetables with lunch and dinner and 2 servings of fruit per day. Berries and greens such  as kale and collards are great choices.  -consume on a regular basis: whole grains (make sure first ingredient on label contains the word "whole"), fresh fruits, fish, nuts, seeds, healthy oils (such as olive oil, avocado oil, grape seed oil) -may eat small amounts of dairy and lean meat on occasion, but avoid processed meats such as ham, bacon, lunch meat, etc. -drink water -try to avoid fast food and pre-packaged foods, processed meat -most experts advise limiting sodium to < '2300mg'$  per day, should limit further is any chronic conditions such as high blood pressure, heart disease, diabetes, etc. The American Heart Association advised that < '1500mg'$  is is ideal -try to avoid foods that contain any ingredients with names you do not recognize  -try to avoid sugar/sweets (except for the natural sugar that occurs in fresh fruit) -try to avoid sweet drinks -try to avoid white rice, white bread, pasta (unless whole grain), white or yellow potatoes  EXERCISE GUIDELINES FOR ADULTS: -if you wish to increase your physical activity, do so gradually and with the approval of your doctor -STOP and seek medical care immediately if you have any chest pain, chest discomfort or trouble breathing when starting or increasing exercise  -move and stretch your body, legs, feet and arms when sitting for long periods -Physical activity guidelines for optimal health in adults: -least 150 minutes per week of aerobic exercise (can talk, but not sing) once approved by your doctor, 20-30 minutes of sustained activity or two 10 minute episodes of sustained activity every day.  -resistance training at least 2 days per week if approved by your doctor -balance exercises 3+ days per week:   Stand somewhere where you have something sturdy to hold onto if you lose balance.  1) lift up on toes, start with 5x per day and work up to 20x   2) stand and lift on leg straight out to the side so that foot is a few inches of the floor,  start with 5x each side and work up to 20x each side   3) stand on one foot, start with 5 seconds each side and work up to 20 seconds on each side  If you need ideas or help with getting more active:  -Silver sneakers https://tools.silversneakers.com  -Walk with a Doc: http://stephens-thompson.biz/  -try to include resistance (weight lifting/strength building) and balance exercises twice per week: or the following link for ideas: ChessContest.fr  UpdateClothing.com.cy  STRESS MANAGEMENT: -can try meditating, or just sitting quietly with deep breathing while intentionally relaxing all parts of your body for 5 minutes daily -if you need further help with stress, anxiety or depression please follow up with your primary doctor or contact the wonderful folks at [place]: [place]: -options in [place] if you wish to engage in more social and exercise related activities:  -Silver sneakers https://tools.silversneakers.com  -Walk with a Doc: http://stephens-thompson.biz/  -Check out the [place] 50+ section on the [place] [employer] (hiking clubs, book clubs, cards and games, chess, exercise classes, aquatic classes and much more) - see the website for details: https://www.[place].gov/departments/parks-recreation/active-adults50  -YouTube has lots of exercise videos for different ages and abilities as well  -[place] (a variety of indoor and outdoor inperson activities for adults). 726-698-6654. [address]  -Virtual Online Classes (a variety of topics): see seniorplanet.org or call [phone]  -consider volunteering at a school, hospice center, church, senior center or elsewhere

## 2022-09-08 NOTE — Progress Notes (Signed)
PATIENT CHECK-IN and HEALTH RISK ASSESSMENT QUESTIONNAIRE:  -completed by phone for upcoming Medicare Preventive Visit  Pre-Visit Check-in: 1)Vitals (height, wt, BP, etc) - record in vitals section for visit on day of visit 2)Review and Update Medications, Allergies PMH, Surgeries, Social history in Epic 3)Hospitalizations in the last year with date/reason? Yes Hernia  - recovered and doing well 4)Review and Update Care Team (patient's specialists) in Epic 5) Complete PHQ9 in Epic  6) Complete Fall Screening in Epic 7)Review all Health Maintenance Due and order under PCP if not done.  8)Medicare Wellness Questionnaire: Answer theses question about your habits: Do you drink alcohol? No  If yes, how many drinks do you have a day? N/A Have you ever smoked? Yes when she was a teenager Quit date if applicable? As a teenager How many packs a day do/did you smoke? 1/2 Do you use smokeless tobacco?No Do you use an illicit drugs?No Do you exercises? Active but does not exercise Are you sexually active? No Number of partners?N/A Typical breakfast Cereal  Typical lunch Sandwich  Typical dinner Vegetables Typical snacks: Trail mix  Beverages: water, Sprite - small bottler once per day  Answer theses question about you: Can you perform most household chores? Yes Do you find it hard to follow a conversation in a noisy room? Sometimes, mild, not an issue Do you often ask people to speak up or repeat themselves? No Do you feel that you have a problem with memory? Yes - just from stress with husband in rehab Do you balance your checkbook and or bank acounts? Yes Do you feel safe at home? Yes Last dentist visit? 03/05/2022 Do you need assistance with any of the following: Please note if so No  Driving? No  Feeding yourself? No  Getting from bed to chair? No  Getting to the toilet? No  Bathing or showering? No  Dressing yourself? No  Managing money? No  Climbing a flight of stairs  No  Preparing meals? No  Do you have Advanced Directives in place (Living Will, Healthcare Power or Attorney)? No - has info, plans to do.    Last eye Exam and location? 04/05/2022 [facility]   Do you currently use prescribed or non-prescribed narcotic or opioid pain medications? No  Do you have a history or close family history of breast, ovarian, tubal or peritoneal cancer or a family member with BRCA (breast cancer susceptibility 1 and 2) gene mutations? Yes  Nurse/Assistant Credentials/time stamp: Mykal G, 3:21 PM   ----------------------------------------------------------------------------------------------------------------------------------------------------------------------------------------------------------------------   MEDICARE ANNUAL PREVENTIVE VISIT WITH PROVIDER: (Welcome to [employer], initial annual wellness or annual wellness exam)  Virtual Visit via Phone Note  I connected with Tonya Graves  on 09/08/22 by phone and verified that I am speaking with the correct person using two identifiers.  Location patient: home Location provider:work or home office Persons participating in the virtual visit: patient, provider  Concerns and/or follow up today: no new problems - reports just saw her PCP yesterday.    See HM section in Epic for other details of completed HM.    ROS: negative for report of fevers, unintentional weight loss, vision changes, vision loss, hearing loss or change, chest pain, sob, hemoptysis, melena, hematochezia, hematuria, genital discharge or lesions, falls, bleeding or bruising, loc, thoughts of suicide or self harm, memory loss  Patient-completed extensive health risk assessment - reviewed and discussed with the patient: See Health Risk Assessment completed with patient prior to the visit either above or in recent phone  note. This was reviewed in detailed with the patient today and appropriate recommendations, orders and referrals were placed  as needed per Summary below and patient instructions.   Review of Medical History: -PMH, [place], Family History and current specialty and care providers reviewed and updated and listed below   Patient Care Team: [name], Hershal Coria as PCP - General (Physician Assistant) [name], MD as PCP - Cardiology (Cardiology) Vickie Epley, MD as PCP - Electrophysiology (Cardiology) Pieter Partridge, DO as Consulting Physician (Neurology)   Past Medical History:  Diagnosis Date   Arthritis    OA   Atrial fibrillation ([place])    Bronchitis    Cataracts, bilateral    Dysrhythmia    history Vtac   GERD (gastroesophageal reflux disease)    Heart palpitations 2016 august   High cholesterol    History of hiatal hernia    Osteopenia    Prediabetes    Stroke [facility])    Urine frequency    UTI (urinary tract infection)    Varicose veins    Ventricular tachycardia (paroxysmal) ([place]) 08/2019   30 day monitor.    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   Procedure: RIGHT TOTAL KNEE ARTHROPLASTY;  Surgeon: Gaynelle Arabian, MD;  Location: [facility];  Service: Orthopedics;  Laterality: Right;   TOTAL KNEE ARTHROPLASTY Left 06/06/2016   Procedure: LEFT TOTAL KNEE ARTHROPLASTY;  Surgeon: Gaynelle Arabian, MD;  Location: [facility];  Service: Orthopedics;  Laterality: Left;   XI ROBOTIC ASSISTED HIATAL HERNIA REPAIR N/A 01/03/2022   Procedure: XI ROBOTIC TAKEDOWN HIATAL HERNIA with fundoplication;  Surgeon: Johnathan Hausen, MD;  Location: [facility];  Service: General;  Laterality: N/A;    Social History   Socioeconomic History   Marital status: Married    Spouse name: Not on file   Number of children: Not on file   Years of education: Not on file   Highest education level: Not on file  Occupational History   Not on file  Tobacco Use   Smoking status: Never    Smokeless tobacco: Never  Vaping Use   Vaping Use: Never used  Substance and Sexual Activity   Alcohol use: No   Drug use: No   Sexual activity: Not on file  Other Topics Concern   Not on file  Social History Narrative   Right handed   Caffeine 1 every week   One story home with husband   Social Determinants of Health   Financial Resource Strain: Low Risk  (05/17/2021)   Overall Financial Resource Strain (CARDIA)    Difficulty of Paying Living Expenses: Not hard at all  Food Insecurity: No Food Insecurity (05/17/2021)   Hunger Vital Sign    Worried About Running Out of Food in the Last Year: Never true    Ran Out of Food in the Last Year: Never true  Transportation Needs: No Transportation Needs (05/17/2021)   PRAPARE - Hydrologist (Medical): No    Lack of Transportation (Non-Medical): No  Physical Activity: Inactive (05/17/2021)   Exercise Vital Sign    Days of Exercise per Week: 0 days    Minutes of Exercise per Session: 0 min  Stress: Stress Concern Present (05/17/2021)   [place] -  Occupational Stress Questionnaire    Feeling of Stress : Rather much  Social Connections: Unknown (05/17/2021)   Social Connection and Isolation Panel [NHANES]    Frequency of Communication with Friends and Family: More than three times a week    Frequency of Social Gatherings with Friends and Family: More than three times a week    Attends Religious Services: Not on file    Active Member of Clubs or Organizations: Yes    Attends Archivist Meetings: More than 4 times per year    Marital Status: Married  Human resources officer Violence: Not At Risk (05/17/2021)   Humiliation, Afraid, Rape, and Kick questionnaire    Fear of Current or Ex-Partner: No    Emotionally Abused: No    Physically Abused: No    Sexually Abused: No    Family History  Problem Relation Age of Onset   High blood pressure Mother    Atrial fibrillation  Mother    Stroke Father    Heart disease Father    High Cholesterol Father    Atrial fibrillation Sister    Cancer Maternal Grandmother    Leukemia Maternal Grandmother    Other Maternal Grandfather        [place] spotted fever   Stroke Paternal Grandmother     Current Outpatient Medications on File Prior to Visit  Medication Sig Dispense Refill   Calcium Carbonate-Vitamin D (CALCIUM 500 + D PO) Take 1 tablet by mouth 2 (two) times daily.     cyclobenzaprine (FLEXERIL) 5 MG tablet Take 1 tablet (5 mg total) by mouth at bedtime. (Patient taking differently: Take 5 mg by mouth at bedtime as needed for muscle spasms.) 30 tablet 1   ELIQUIS 5 MG TABS tablet TAKE 1 TABLET BY MOUTH TWICE A DAY 180 tablet 1   furosemide (LASIX) 20 MG tablet Take 1 tablet (20 mg total) by mouth daily as needed for edema. 90 tablet 1   loratadine (CLARITIN) 10 MG tablet Take 10 mg by mouth at bedtime.     metoprolol tartrate (LOPRESSOR) 25 MG tablet Take 1 tablet (25 mg total) by mouth 2 (two) times daily. 1 po bid 180 tablet 1   pantoprazole (PROTONIX) 40 MG tablet TAKE 1 TABLET BY MOUTH EVERY DAY 90 tablet 3   potassium chloride (KLOR-CON M) 10 MEQ tablet Take 1 tablet (10 mEq total) by mouth daily. When taking furosemide 90 tablet 1   Vitamin D, Ergocalciferol, (DRISDOL) 1.25 MG (50000 UNIT) CAPS capsule TAKE 1 CAPSULE BY MOUTH ONE TIME PER WEEK 12 capsule 5   Wheat Dextrin (BENEFIBER) POWD Take 1 Scoop by mouth at bedtime.     Propylene Glycol (SYSTANE COMPLETE OP) Place 1 drop into both eyes daily. (Patient not taking: Reported on 09/08/2022)     No current facility-administered medications on file prior to visit.    No Known Allergies     Physical Exam There were no vitals filed for this visit. Estimated body mass index is 37.79 kg/m as calculated from the following:   Height as of this encounter: _0  (1.549 m).   Weight as of this encounter: 200 lb (90.7 kg).  EKG (optional): deferred due to  virtual visit  GENERAL: alert, oriented, no audible sounds of distress full vision exam deferred due to pandemic and/or virtual encounter   PSYCH/NEURO: pleasant and cooperative, no obvious depression or anxiety, speech and thought processing grossly intact, Cognitive function grossly intact  Flowsheet Row Video Visit from 09/08/2022  in [place] at [place]  PHQ-9 Total Score 5           09/08/2022    3:14 PM 05/20/2022    9:14 AM 11/15/2021    9:34 AM 05/17/2021   10:06 AM 02/25/2020   11:09 AM  Depression screen PHQ 2/9  Decreased Interest 0 0 0 0 0  Down, Depressed, Hopeless 0 0 0 0 0  PHQ - 2 Score 0 0 0 0 0  Altered sleeping 0 0   1  Tired, decreased energy _0 Change in appetite 1 0   0  Feeling bad or failure about yourself  0 0   0  Trouble concentrating 2 0   1  Moving slowly or fidgety/restless 0 0   0  Suicidal thoughts 0 0   0  PHQ-9 Score _1 Difficult doing work/chores Somewhat difficult Not difficult at all          01/04/2022    7:46 AM 01/04/2022    9:08 PM 01/05/2022    7:53 AM 03/11/2022   10:02 AM 09/08/2022    3:16 PM  Fall Risk  Falls in the past year?    0 0  Was there an injury with Fall?    0 0  Fall Risk Category Calculator    0 0  Fall Risk Category    Low Low  Patient Fall Risk Level Moderate fall risk Moderate fall risk Moderate fall risk  Low fall risk  Patient at Risk for Falls Due to     No Fall Risks  Fall risk Follow up     Falls evaluation completed     SUMMARY AND PLAN:  Encounter for Medicare annual wellness exam  V  Discussed applicable health maintenance/preventive health measures and advised and referred or ordered per patient preferences:  Health Maintenance  Topic Date Due   Zoster Vaccines- Shingrix (1 of 2) Never done, reports is on her to do list and plans to get this week   DEXA SCAN  10/01/2020, reports PCP ordered but she has had to delay due to taking care of her husband in rehab, she plans to  reschedule in march   COVID-19 Vaccine (6 - 2023-24 season) Discussed due annually   Medicare Annual Wellness (AWV)  09/09/2023   MAMMOGRAM  11/23/2022   COLONOSCOPY (Pts 45-65yr Insurance coverage will need to be confirmed)  03/04/2026   DTaP/Tdap/Td (2 - Td or Tdap) 03/23/2026   Pneumonia Vaccine 72 Years old  Completed   INFLUENZA VACCINE  Completed   HPV VACCINES  Aged Out   Hepatitis C Screening  Discontinued      Education and counseling on the following was provided based on the above review of health and a plan/checklist for the patient, along with additional information discussed, was provided for the patient in the patient instructions :  -Advised on importance of and resources for completing advanced directives. She reports has the info - just needs to get it done and plans to.  -denies hearing issues  -Advised and counseled on maintaining healthy weight and healthy lifestyle - including the importance of a health diet, regular physical activity, social connections and stress management. -Advised and counseled on a whole foods based healthy diet and regular exercise: discussed a heart healthy whole foods based diet at length. A summary of a healthy diet was provided in the Patient Instructions. - Recommended regular exercise and discussed options  within the community.  -Advise yearly dental visits at minimum and regular eye exams   Follow up: see patient instructions     Patient Instructions  I really enjoyed getting to talk with you today! I am available on Tuesdays and Thursdays for virtual visits if you have any questions or concerns, or if I can be of any further assistance.   CHECKLIST FROM ANNUAL WELLNESS VISIT:  -Follow up (please call to schedule if not scheduled after visit):  -Inperson visit with your Primary Doctor office: as planned -yearly for annual wellness visit with primary care office  Here is a list of your preventive care/health maintenance measures  and the plan for each if any are due:  Health Maintenance  Topic Date Due   Zoster Vaccines- Shingrix (1 of 2) Never done   DEXA SCAN  10/01/2020   COVID-19 Vaccine (6 - 2023-24 season) 05/06/2022   MAMMOGRAM  11/23/2022   Medicare Annual Wellness ([place])  09/09/2023   COLONOSCOPY (Pts 45-64yr Insurance coverage will need to be confirmed)  03/04/2026   DTaP/Tdap/Td (2 - Td or Tdap) 03/23/2026   Pneumonia Vaccine 72 Years old  Completed   INFLUENZA VACCINE  Completed   HPV VACCINES  Aged Out   Hepatitis C Screening  Discontinued    -See a dentist at least yearly  -Get your eyes checked and then per your eye specialist's recommendations  -Other issues addressed today:  -I have included below further information regarding a healthy whole foods based diet, physical activity guidelines for adults, stress management and opportunities for social connections. I hope you find this information useful.   -----------------------------------------------------------------------------------------------------------------------------------------------------------------------------------------------------------------------------------------------------------  NUTRITION: -eat real food: lots of colorful vegetables (half the plate) and fruits -5-7 servings of vegetables and fruits per day (fresh or steamed is best), exp. 2 servings of vegetables with lunch and dinner and 2 servings of fruit per day. Berries and greens such as kale and collards are great choices.  -consume on a regular basis: whole grains (make sure first ingredient on label contains the word "whole"), fresh fruits, fish, nuts, seeds, healthy oils (such as olive oil, avocado oil, grape seed oil) -may eat small amounts of dairy and lean meat on occasion, but avoid processed meats such as ham, bacon, lunch meat, etc. -drink water -try to avoid fast food and pre-packaged foods, processed meat -most experts advise limiting sodium to <  23039mper day, should limit further is any chronic conditions such as high blood pressure, heart disease, diabetes, etc. The American Heart Association advised that < 150046ms is ideal -try to avoid foods that contain any ingredients with names you do not recognize  -try to avoid sugar/sweets (except for the natural sugar that occurs in fresh fruit) -try to avoid sweet drinks -try to avoid white rice, white bread, pasta (unless whole grain), white or yellow potatoes  EXERCISE GUIDELINES FOR ADULTS: -if you wish to increase your physical activity, do so gradually and with the approval of your doctor -STOP and seek medical care immediately if you have any chest pain, chest discomfort or trouble breathing when starting or increasing exercise  -move and stretch your body, legs, feet and arms when sitting for long periods -Physical activity guidelines for optimal health in adults: -least 150 minutes per week of aerobic exercise (can talk, but not sing) once approved by your doctor, 20-30 minutes of sustained activity or two 10 minute episodes of sustained activity every day.  -resistance training at least 2 days per week  if approved by your doctor -balance exercises 3+ days per week:   Stand somewhere where you have something sturdy to hold onto if you lose balance.    1) lift up on toes, start with 5x per day and work up to 20x   2) stand and lift on leg straight out to the side so that foot is a few inches of the floor, start with 5x each side and work up to 20x each side   3) stand on one foot, start with 5 seconds each side and work up to 20 seconds on each side  If you need ideas or help with getting more active:  -Silver sneakers https://tools.silversneakers.com  -Walk with a Doc: http://stephens-thompson.biz/  -try to include resistance (weight lifting/strength building) and balance exercises twice per week: or the following link for  ideas: ChessContest.fr  UpdateClothing.com.cy  STRESS MANAGEMENT: -can try meditating, or just sitting quietly with deep breathing while intentionally relaxing all parts of your body for 5 minutes daily -if you need further help with stress, anxiety or depression please follow up with your primary doctor or contact the wonderful folks at [place]: [place]: -options in [place] if you wish to engage in more social and exercise related activities:  -Silver sneakers https://tools.silversneakers.com  -Walk with a Doc: http://stephens-thompson.biz/  -Check out the [place] 50+ section on the [place] [employer] (hiking clubs, book clubs, cards and games, chess, exercise classes, aquatic classes and much more) - see the website for details: https://www.[place].gov/departments/parks-recreation/active-adults50  -YouTube has lots of exercise videos for different ages and abilities as well  -[place] (a variety of indoor and outdoor inperson activities for adults). [phone]. [address]  -Virtual Online Classes (a variety of topics): see seniorplanet.org or call [phone])[phone]  -consider volunteering at a school, hospice center, church, senior center or elsewhere           Lucretia Kern, DO

## 2022-09-08 NOTE — Telephone Encounter (Signed)
Returned call to Pt.  She has been staying at rehab with her husband and was concerned about transmitting data.  She states she was home on 09/05/22 for a few hours, advised it looks like her monitor read her information at that time and sent an updated report.  She states her husband should be home in the next week or 2 and then she will be monitored daily.

## 2022-09-09 NOTE — Progress Notes (Signed)
Carelink Summary Report / Loop Recorder 

## 2022-09-30 ENCOUNTER — Encounter: Payer: Self-pay | Admitting: Cardiology

## 2022-09-30 ENCOUNTER — Ambulatory Visit: Payer: PPO | Attending: Cardiology | Admitting: Cardiology

## 2022-09-30 VITALS — BP 130/70 | HR 60 | Ht 61.0 in | Wt 197.0 lb

## 2022-09-30 DIAGNOSIS — I480 Paroxysmal atrial fibrillation: Secondary | ICD-10-CM | POA: Diagnosis not present

## 2022-09-30 DIAGNOSIS — I639 Cerebral infarction, unspecified: Secondary | ICD-10-CM | POA: Diagnosis not present

## 2022-09-30 NOTE — Progress Notes (Deleted)
Electrophysiology Office Follow up Visit Note:    Date:  09/30/2022   ID:  Tonya Graves, DOB 1951-03-26, MRN [id number]  PCP:  [name], [place] Cardiologist:  Quay Burow, MD  [facility] Electrophysiologist:  Vickie Epley, MD    Interval History:    Tonya Graves is a 72 y.o. female who presents for a follow up visit.  I last saw the patient December 24, 2021 for history of atrial fibrillation and flutter on Eliquis.  We discussed treatment options for her atrial fibrillation during her last appointment.  We discussed antiarrhythmic drugs and catheter ablation and plan to use her loop recorder to monitor the burden of atrial fibrillation.       Past Medical History:  Diagnosis Date   Arthritis    OA   Atrial fibrillation ([place])    Bronchitis    Cataracts, bilateral    Dysrhythmia    history Vtac   GERD (gastroesophageal reflux disease)    Heart palpitations 2016 august   High cholesterol    History of hiatal hernia    Osteopenia    Prediabetes    Stroke [facility])    Urine frequency    UTI (urinary tract infection)    Varicose veins    Ventricular tachycardia (paroxysmal) ([place]) 08/2019   30 day monitor.    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   Procedure: RIGHT TOTAL KNEE ARTHROPLASTY;  Surgeon: Gaynelle Arabian, MD;  Location: [facility];  Service: Orthopedics;  Laterality: Right;   TOTAL KNEE ARTHROPLASTY Left 06/06/2016   Procedure: LEFT TOTAL KNEE ARTHROPLASTY;  Surgeon: Gaynelle Arabian, MD;  Location: [facility];  Service: Orthopedics;  Laterality: Left;   XI ROBOTIC ASSISTED HIATAL HERNIA REPAIR N/A 01/03/2022   Procedure: XI ROBOTIC TAKEDOWN HIATAL HERNIA with fundoplication;  Surgeon: Johnathan Hausen, MD;  Location: [facility];  Service: General;  Laterality: N/A;    Current Medications: No  outpatient medications have been marked as taking for the 09/30/22 encounter (Appointment) with Vickie Epley, MD.     Allergies:   Patient has no known allergies.   Social History   Socioeconomic History   Marital status: Married    Spouse name: Not on file   Number of children: Not on file   Years of education: Not on file   Highest education level: Not on file  Occupational History   Not on file  Tobacco Use   Smoking status: Never   Smokeless tobacco: Never  Vaping Use   Vaping Use: Never used  Substance and Sexual Activity   Alcohol use: No   Drug use: No   Sexual activity: Not on file  Other Topics Concern   Not on file  Social History Narrative   Right handed   Caffeine 1 every week   One story home with husband   Social Determinants of Health   Financial Resource Strain: Low Risk  (05/17/2021)   Overall Financial Resource Strain (CARDIA)    Difficulty of Paying Living Expenses: Not hard at all  Food Insecurity: No Food Insecurity (05/17/2021)   Hunger Vital Sign    Worried About Running Out of Food in the Last Year: Never true    [place] in the Last Year: Never true  Transportation Needs: No Transportation Needs (05/17/2021)   PRAPARE - Hydrologist (Medical): No    Lack of Transportation (Non-Medical): No  Physical Activity: Inactive (05/17/2021)   Exercise Vital Sign    Days of Exercise per Week: 0 days    Minutes of Exercise per Session: 0 min  Stress: Stress Concern Present (05/17/2021)   [place]    Feeling of Stress : Rather much  Social Connections: Unknown (05/17/2021)   Social Connection and Isolation Panel [NHANES]    Frequency of Communication with Friends and Family: More than three times a week    Frequency of Social Gatherings with Friends and Family: More than three times a week    Attends Religious Services: Not on Surveyor, quantity or Organizations: Yes    Attends Music therapist: More than 4 times per year    Marital Status: Married     Family History: The patient's family history includes Atrial fibrillation in her mother and sister; Cancer in her maternal grandmother; Heart disease in her father; High Cholesterol in her father; High blood pressure in her mother; Leukemia in her maternal grandmother; Other in her maternal grandfather; Stroke in her father and paternal grandmother.  ROS:   Please see the history of present illness.    All other systems reviewed and are negative.  EKGs/Labs/Other Studies Reviewed:    The following studies were reviewed today:  Loop recorder interrogations reviewed and showed an episode of A-fib and in October and in December, each lasting greater than 6 hours.    Recent Labs: 05/20/2022: Hemoglobin 11.3; Platelets 211; TSH 2.090 08/02/2022: NT-Pro BNP 391 09/07/2022: ALT 14; BUN 22; Creatinine, Ser 0.87; Potassium 4.2; Sodium 141  Recent Lipid Panel    Component Value Date/Time   CHOL 171 05/20/2022 0931   TRIG 29 05/20/2022 0931   HDL 103 05/20/2022 0931   CHOLHDL 1.7 05/20/2022 0931   LDLCALC 61 05/20/2022 0931    Physical Exam:    VS:  There were no vitals taken for this visit.    Wt Readings from Last 3 Encounters:  09/08/22 200 lb (90.7 kg)  09/07/22 200 lb (90.7 kg)  08/02/22 201 lb 9.6 oz (91.4 kg)     GEN: *** Well nourished, well developed in no acute distress CARDIAC: ***RRR, no murmurs, rubs, gallops RESPIRATORY:  Clear to auscultation without rales, wheezing or rhonchi  PSYCHIATRIC:  Normal affect        ASSESSMENT:    1. Cerebrovascular accident (CVA), unspecified mechanism ([place])   2. PAF (paroxysmal atrial fibrillation) (HCC)   3. Cardioembolic stroke (HCC)    PLAN:    In order of problems listed above:   #Stroke #Paroxysmal atrial fibrillation Low burden of atrial fibrillation.  On oral  anticoagulation.   #History of cardioembolic stroke Continue anticoagulation      Medication Adjustments/Labs and Tests Ordered: Current medicines are reviewed at length with the patient today.  Concerns regarding medicines are outlined above.  No orders of the defined types were placed in this encounter.  No orders of the defined types were placed in this encounter.    Signed, Lars Mage, MD, [facility], [facility] 09/30/2022 5:41 AM    Electrophysiology [facility]

## 2022-09-30 NOTE — Progress Notes (Signed)
Electrophysiology Office Follow up Visit Note:    Date:  09/30/2022   ID:  Tonya Graves, DOB 03-Aug-1951, MRN [id number]  PCP:  [name], [place] Cardiologist:  Quay Burow, MD  [facility] Electrophysiologist:  Vickie Epley, MD    Interval History:    Tonya Graves is a 72 y.o. female who presents for a follow up visit.  I last saw the patient December 24, 2021 for history of atrial fibrillation and flutter on Eliquis.  We discussed treatment options for her atrial fibrillation during her last appointment.  We discussed antiarrhythmic drugs and catheter ablation and plan to use her loop recorder to monitor the burden of atrial fibrillation.  Today, she states she is feeling "so-so." Unfortunately her husband passed away this past 12/06/22; offered my condolences. She complains of recent LE swelling, mostly in her feet. This may be related to sitting in the hospital for long periods as she was with her husband. She monitors her weight daily.  We reviewed her device interrogation revealing her last Afib episode in December, and heart rates as high as 170-180 bpm during atrial fibrillation. Usually she can tell when she is in Afib. She denies any recent episodes.  Remains compliant with Eliquis. No bleeding issues.  She denies any palpitations, chest pain, shortness of breath. No lightheadedness, headaches, syncope, orthopnea, or PND.  Due to elevated liver enzymes her cholesterol medication was discontinued.      Past Medical History:  Diagnosis Date   Arthritis    OA   Atrial fibrillation ([place])    Bronchitis    Cataracts, bilateral    Dysrhythmia    history Vtac   GERD (gastroesophageal reflux disease)    Heart palpitations 2016 august   High cholesterol    History of hiatal hernia    Osteopenia    Prediabetes    Stroke [facility])    Urine frequency    UTI (urinary tract infection)    Varicose veins    Ventricular tachycardia (paroxysmal) ([place])  08/2019   30 day monitor.    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   Procedure: RIGHT TOTAL KNEE ARTHROPLASTY;  Surgeon: Gaynelle Arabian, MD;  [facility];  Service: Orthopedics;  Laterality: Right;   TOTAL KNEE ARTHROPLASTY Left 06/06/2016   Procedure: LEFT TOTAL KNEE ARTHROPLASTY;  Surgeon: Gaynelle Arabian, MD;  [facility];  Service: Orthopedics;  Laterality: Left;   XI ROBOTIC ASSISTED HIATAL HERNIA REPAIR N/A 01/03/2022   Procedure: XI ROBOTIC TAKEDOWN HIATAL HERNIA with fundoplication;  Surgeon: Johnathan Hausen, MD;  [facility];  Service: General;  Laterality: N/A;    Current Medications: Current Meds  Medication Sig   Calcium Carbonate-Vitamin D (CALCIUM 500 + D PO) Take 1 tablet by mouth 2 (two) times daily.   cyclobenzaprine (FLEXERIL) 5 MG tablet Take 1 tablet (5 mg total) by mouth at bedtime. (Patient taking differently: Take 5 mg by mouth at bedtime as needed for muscle spasms.)   ELIQUIS 5 MG TABS tablet TAKE 1 TABLET BY MOUTH TWICE A DAY   furosemide (LASIX) 20 MG tablet Take 1 tablet (20 mg total) by mouth daily as needed for edema.   loratadine (CLARITIN) 10 MG tablet Take 10 mg by mouth at bedtime.  metoprolol tartrate (LOPRESSOR) 25 MG tablet Take 1 tablet (25 mg total) by mouth 2 (two) times daily. 1 po bid   pantoprazole (PROTONIX) 40 MG tablet TAKE 1 TABLET BY MOUTH EVERY DAY   potassium chloride (KLOR-CON M) 10 MEQ tablet Take 1 tablet (10 mEq total) by mouth daily. When taking furosemide   Propylene Glycol (SYSTANE COMPLETE OP) Place 1 drop into both eyes daily.   Vitamin D, Ergocalciferol, (DRISDOL) 1.25 MG (50000 UNIT) CAPS capsule TAKE 1 CAPSULE BY MOUTH ONE TIME PER WEEK   Wheat Dextrin (BENEFIBER) POWD Take 1 Scoop by mouth at bedtime.     Allergies:   Patient has no known allergies.   Social  History   Socioeconomic History   Marital status: Married    Spouse name: Not on file   Number of children: Not on file   Years of education: Not on file   Highest education level: Not on file  Occupational History   Not on file  Tobacco Use   Smoking status: Never   Smokeless tobacco: Never  Vaping Use   Vaping Use: Never used  Substance and Sexual Activity   Alcohol use: No   Drug use: No   Sexual activity: Not on file  Other Topics Concern   Not on file  Social History Narrative   Right handed   Caffeine 1 every week   One story home with husband   Social Determinants of Health   Financial Resource Strain: Low Risk  (05/17/2021)   Overall Financial Resource Strain (CARDIA)    Difficulty of Paying Living Expenses: Not hard at all  Food Insecurity: No Food Insecurity (05/17/2021)   Hunger Vital Sign    Worried About Running Out of Food in the Last Year: Never true    [place] in the Last Year: Never true  Transportation Needs: No Transportation Needs (05/17/2021)   PRAPARE - Hydrologist (Medical): No    Lack of Transportation (Non-Medical): No  Physical Activity: Inactive (05/17/2021)   Exercise Vital Sign    Days of Exercise per Week: 0 days    Minutes of Exercise per Session: 0 min  Stress: Stress Concern Present (05/17/2021)   [place]    Feeling of Stress : Rather much  Social Connections: Unknown (05/17/2021)   Social Connection and Isolation Panel [NHANES]    Frequency of Communication with Friends and Family: More than three times a week    Frequency of Social Gatherings with Friends and Family: More than three times a week    Attends Religious Services: Not on Advertising copywriter or Organizations: Yes    Attends Music therapist: More than 4 times per year    Marital Status: Married     Family History: The patient's family history  includes Atrial fibrillation in her mother and sister; Cancer in her maternal grandmother; Heart disease in her father; High Cholesterol in her father; High blood pressure in her mother; Leukemia in her maternal grandmother; Other in her maternal grandfather; Stroke in her father and paternal grandmother.  ROS:   Please see the history of present illness.    All other systems reviewed and are negative.  EKGs/Labs/Other Studies Reviewed:    The following studies were reviewed today:  Loop recorder interrogations reviewed and showed an episode of A-fib and in October and in December, each lasting greater than 6  hours.    Recent Labs: 05/20/2022: Hemoglobin 11.3; Platelets 211; TSH 2.090 08/02/2022: NT-Pro BNP 391 09/07/2022: ALT 14; BUN 22; Creatinine, Ser 0.87; Potassium 4.2; Sodium 141   Recent Lipid Panel    Component Value Date/Time   CHOL 171 05/20/2022 0931   TRIG 29 05/20/2022 0931   HDL 103 05/20/2022 0931   CHOLHDL 1.7 05/20/2022 0931   LDLCALC 61 05/20/2022 0931    Physical Exam:    VS:  BP 130/70   Pulse 60   Ht '5\' 1"'$  (1.549 m)   Wt 197 lb (89.4 kg)   SpO2 99%   BMI 37.22 kg/m     Wt Readings from Last 3 Encounters:  09/30/22 197 lb (89.4 kg)  09/08/22 200 lb (90.7 kg)  09/07/22 200 lb (90.7 kg)     GEN: Well nourished, well developed in no acute distress CARDIAC: RRR, no murmurs, rubs, gallops.  ILR pocket well healed. PSYCHIATRIC:  Normal affect        ASSESSMENT:    1. Cerebrovascular accident (CVA), unspecified mechanism ([place])   2. PAF (paroxysmal atrial fibrillation) (HCC)   3. Cardioembolic stroke (HCC)    PLAN:    In order of problems listed above:   #Stroke #Paroxysmal atrial fibrillation Low burden of atrial fibrillation.  On oral anticoagulation.  #History of cardioembolic stroke Continue anticoagulation  #Chronic diastolic heart failure Euvolemic today but has had an elevated BNP recently.  Last echo was in 2019.  Will repeat  her echocardiogram to make sure there is been no change in valvular or left ventricular function.  Follow-up 6 months.  Medication Adjustments/Labs and Tests Ordered: Current medicines are reviewed at length with the patient today.  Concerns regarding medicines are outlined above.   No orders of the defined types were placed in this encounter.  No orders of the defined types were placed in this encounter.  I,Mathew Stumpf,acting as a Education administrator for Vickie Epley, MD.,have documented all relevant documentation on the behalf of Vickie Epley, MD,as directed by  Vickie Epley, MD while in the presence of Vickie Epley, MD.  I, Vickie Epley, MD, have reviewed all documentation for this visit. The documentation on 09/30/22 for the exam, diagnosis, procedures, and orders are all accurate and complete.   Signed, Lars Mage, MD, [facility], [facility] 09/30/2022 2:24 PM    Electrophysiology [facility]

## 2022-09-30 NOTE — Patient Instructions (Signed)
Medication Instructions:  Your physician recommends that you continue on your current medications as directed. Please refer to the Current Medication list given to you today.  Testing/Procedures: Your physician has requested that you have an echocardiogram. Echocardiography is a painless test that uses sound waves to create images of your heart. It provides your doctor with information about the size and shape of your heart and how well your heart's chambers and valves are working. This procedure takes approximately one hour. There are no restrictions for this procedure. Please do NOT wear cologne, perfume, aftershave, or lotions (deodorant is allowed). Please arrive 15 minutes prior to your appointment time.  Follow-Up: At [facility], you and your health needs are our priority.  As part of our continuing mission to provide you with exceptional heart care, we have created designated Provider Care Teams.  These Care Teams include your primary Cardiologist (physician) and Advanced Practice Providers (APPs -  Physician Assistants and Nurse Practitioners) who all work together to provide you with the care you need, when you need it.  Your next appointment:   6 month(s)  Provider:   You may see Vickie Epley, MD or one of the following Advanced Practice Providers on your designated Care Team:   Tommye Standard, [place] Beryle Beams" [place], [place], NP

## 2022-09-30 NOTE — Addendum Note (Signed)
Addended by: Bernestine Amass on: 09/30/2022 02:36 PM   Modules accepted: Orders

## 2022-10-04 NOTE — Progress Notes (Signed)
Carelink Summary Report / Loop Recorder

## 2022-10-07 LAB — CUP PACEART REMOTE DEVICE CHECK
Date Time Interrogation Session: 20240201231207
Implantable Pulse Generator Implant Date: 20220214

## 2022-10-10 ENCOUNTER — Ambulatory Visit: Payer: PPO

## 2022-10-10 DIAGNOSIS — I639 Cerebral infarction, unspecified: Secondary | ICD-10-CM | POA: Diagnosis not present

## 2022-10-25 ENCOUNTER — Ambulatory Visit: Payer: PPO | Attending: Cardiovascular Disease | Admitting: Cardiovascular Disease

## 2022-10-25 ENCOUNTER — Encounter: Payer: Self-pay | Admitting: Cardiovascular Disease

## 2022-10-25 VITALS — BP 116/70 | HR 53 | Ht 61.0 in | Wt 196.8 lb

## 2022-10-25 DIAGNOSIS — I480 Paroxysmal atrial fibrillation: Secondary | ICD-10-CM

## 2022-10-25 DIAGNOSIS — E782 Mixed hyperlipidemia: Secondary | ICD-10-CM

## 2022-10-25 DIAGNOSIS — R931 Abnormal findings on diagnostic imaging of heart and coronary circulation: Secondary | ICD-10-CM | POA: Diagnosis not present

## 2022-10-25 LAB — LIPID PANEL
Chol/HDL Ratio: 2.3 ratio (ref 0.0–4.4)
Cholesterol, Total: 234 mg/dL — ABNORMAL HIGH (ref 100–199)
HDL: 102 mg/dL (ref >39)
LDL Chol Calc (NIH): 120 mg/dL — ABNORMAL HIGH (ref 0–99)
Triglycerides: 71 mg/dL (ref 0–149)
VLDL Cholesterol Cal: 12 mg/dL (ref 5–40)

## 2022-10-25 LAB — HEPATIC FUNCTION PANEL
ALT: 14 IU/L (ref 0–32)
AST: 20 IU/L (ref 0–40)
Albumin: 3.9 g/dL (ref 3.9–4.9)
Alkaline Phosphatase: 75 IU/L (ref 44–121)
Bilirubin Total: 0.3 mg/dL (ref 0.0–1.2)
Bilirubin, Direct: <0.1 mg/dL (ref 0.00–0.40)
Total Protein: 6.6 g/dL (ref 6.0–8.5)

## 2022-10-25 NOTE — Assessment & Plan Note (Signed)
History of PAF maintaining sinus rhythm on Eliquis oral anticoagulation.  She has had a breast.  She saw Dr. Lars Mage for evaluation who talked about antiarrhythmic therapy, ablation versus continued medical therapy which she has elected to do.

## 2022-10-25 NOTE — Assessment & Plan Note (Signed)
Tonya Graves calcium score performed/17/23 was 33 total all of which was in the LAD.

## 2022-10-25 NOTE — Progress Notes (Signed)
10/25/2022 Tonya Graves   [birth year]  [id number]  Primary Physician [name], PA-C Primary Cardiologist: [name] MD [name], [place]  HPI:  Tonya Graves is a 72 y.o.   mildly overweight married Caucasian female mother 2, grandmother and 3 grandchildren referred to me by Dr. Tobie Poet for cardiology evaluation because of palpitations.  I last saw her in the office 11/03/2021.  She has seen Dr. Bettina Gavia remotely for similar symptoms that occur during stressful times in her life and was prescribed when necessary metoprolol. She has no cardiac risk factors. She's never had a heart attack or stroke. She has occasional atypical chest pain with does not sound cardiac. Apparently her mother-in-law moved in with them towards the end of December 2018 which caused a lot of stress at that time.  Unfortunately, she has since passed away 12-06-2022 of last year and her mother has subsequent passed away as well.  I did perform an event monitor which showed sinus rhythm with PACs and short atrial runs and a 2D echo that was entirely normal.     She had a stroke on 07/10/2020 and was treated at [facility].  Work-up was unrevealing including 2D echo in 2-week event monitor.  She did have some palpitations in January.    Dr. Sallyanne Kuster implanted a loop recorder at my request on 10/19/2020.   Since I saw her in the office a year ago she has done well.  She unfortunately lost her husband on 09/25/2022 after 64 years of marriage.  She is living alone.  She has seen Dr. Lars Mage, electrophysiology, for further evaluation of her PAF.  She did have a loop recorder implanted.  She is maintaining sinus rhythm on Eliquis.  Dr. Quentin Ore did have discussion regarding antiarrhythmic therapy and catheter ablation but currently plans to only treat her medically at this time.   Current Meds  Medication Sig   Calcium Carbonate-Vitamin D (CALCIUM 500 + D PO) Take 1 tablet by mouth 2 (two) times daily.    cyclobenzaprine (FLEXERIL) 5 MG tablet Take 1 tablet (5 mg total) by mouth at bedtime. (Patient taking differently: Take 5 mg by mouth at bedtime as needed for muscle spasms.)   ELIQUIS 5 MG TABS tablet TAKE 1 TABLET BY MOUTH TWICE A DAY   furosemide (LASIX) 20 MG tablet Take 1 tablet (20 mg total) by mouth daily as needed for edema.   loratadine (CLARITIN) 10 MG tablet Take 10 mg by mouth at bedtime.   metoprolol tartrate (LOPRESSOR) 25 MG tablet Take 1 tablet (25 mg total) by mouth 2 (two) times daily. 1 po bid   pantoprazole (PROTONIX) 40 MG tablet TAKE 1 TABLET BY MOUTH EVERY DAY   potassium chloride (KLOR-CON M) 10 MEQ tablet Take 1 tablet (10 mEq total) by mouth daily. When taking furosemide   Propylene Glycol (SYSTANE COMPLETE OP) Place 1 drop into both eyes daily.   Vitamin D, Ergocalciferol, (DRISDOL) 1.25 MG (50000 UNIT) CAPS capsule TAKE 1 CAPSULE BY MOUTH ONE TIME PER WEEK   Wheat Dextrin (BENEFIBER) POWD Take 1 Scoop by mouth at bedtime.     No Known Allergies  Social History   Socioeconomic History   Marital status: Married    Spouse name: Not on file   Number of children: Not on file   Years of education: Not on file   Highest education level: Not on file  Occupational History   Not on file  Tobacco  Use   Smoking status: Never   Smokeless tobacco: Never  Vaping Use   Vaping Use: Never used  Substance and Sexual Activity   Alcohol use: No   Drug use: No   Sexual activity: Not on file  Other Topics Concern   Not on file  Social History Narrative   Right handed   Caffeine 1 every week   One story home with husband   Social Determinants of Health   Financial Resource Strain: Low Risk  (05/17/2021)   Overall Financial Resource Strain (CARDIA)    Difficulty of Paying Living Expenses: Not hard at all  Food Insecurity: No Food Insecurity (05/17/2021)   Hunger Vital Sign    Worried About Running Out of Food in the Last Year: Never true    Ran Out of Food in the  Last Year: Never true  Transportation Needs: No Transportation Needs (05/17/2021)   PRAPARE - Hydrologist (Medical): No    Lack of Transportation (Non-Medical): No  Physical Activity: Inactive (05/17/2021)   Exercise Vital Sign    Days of Exercise per Week: 0 days    Minutes of Exercise per Session: 0 min  Stress: Stress Concern Present (05/17/2021)   [place]    Feeling of Stress : Rather much  Social Connections: Unknown (05/17/2021)   Social Connection and Isolation Panel [NHANES]    Frequency of Communication with Friends and Family: More than three times a week    Frequency of Social Gatherings with Friends and Family: More than three times a week    Attends Religious Services: Not on file    Active Member of Clubs or Organizations: Yes    Attends Archivist Meetings: More than 4 times per year    Marital Status: Married  Human resources officer Violence: Not At Risk (05/17/2021)   Humiliation, Afraid, Rape, and Kick questionnaire    Fear of Current or Ex-Partner: No    Emotionally Abused: No    Physically Abused: No    Sexually Abused: No     Review of Systems: General: negative for chills, fever, night sweats or weight changes.  Cardiovascular: negative for chest pain, dyspnea on exertion, edema, orthopnea, palpitations, paroxysmal nocturnal dyspnea or shortness of breath Dermatological: negative for rash Respiratory: negative for cough or wheezing Urologic: negative for hematuria Abdominal: negative for nausea, vomiting, diarrhea, bright red blood per rectum, melena, or hematemesis Neurologic: negative for visual changes, syncope, or dizziness All other systems reviewed and are otherwise negative except as noted above.    Blood pressure 116/70, pulse (!) 53, height 5' 1"$  (1.549 m), weight 196 lb 12.8 oz (89.3 kg), SpO2 98 %.  General appearance: alert and no distress Neck:  no adenopathy, no carotid bruit, no JVD, supple, symmetrical, trachea midline, and thyroid not enlarged, symmetric, no tenderness/mass/nodules Lungs: clear to auscultation bilaterally Heart: regular rate and rhythm, S1, S2 normal, no murmur, click, rub or gallop Extremities: extremities normal, atraumatic, no cyanosis or edema Pulses: 2+ and symmetric Skin: Skin color, texture, turgor normal. No rashes or lesions Neurologic: Grossly normal  EKG sinus bradycardia 53 with left axis deviation and LVH voltage with inferolateral T wave inversion.  I personally reviewed this EKG.  ASSESSMENT AND PLAN:   Mixed hyperlipidemia History of hyperlipidemia previously on high-dose statin therapy with lipid profile performed 05/20/2022 revealing total cholesterol 171, LDL 61 and HDL of 103.  This was discontinued because of elevated LFTs.  We will recheck a lipid liver profile today.  PAF (paroxysmal atrial fibrillation) (HCC) History of PAF maintaining sinus rhythm on Eliquis oral anticoagulation.  She has had a breast.  She saw Dr. Lars Mage for evaluation who talked about antiarrhythmic therapy, ablation versus continued medical therapy which she has elected to do.  Elevated coronary artery calcium score Cory calcium score performed/17/23 was 33 total all of which was in the LAD.     [name] MD FACP,FACC,FAHA, [facility] 10/25/2022 11:01 AM

## 2022-10-25 NOTE — Patient Instructions (Signed)
Medication Instructions:  Your physician recommends that you continue on your current medications as directed. Please refer to the Current Medication list given to you today.  *If you need a refill on your cardiac medications before your next appointment, please call your pharmacy*   Lab Work: Your physician recommends that you have labs drawn today: Lipid/liver panel  If you have labs (blood work) drawn today and your tests are completely normal, you will receive your results only by: MyChart Message (if you have MyChart) OR A paper copy in the mail If you have any lab test that is abnormal or we need to change your treatment, we will call you to review the results.   Follow-Up: At [facility], you and your health needs are our priority.  As part of our continuing mission to provide you with exceptional heart care, we have created designated Provider Care Teams.  These Care Teams include your primary Cardiologist (physician) and Advanced Practice Providers (APPs -  Physician Assistants and Nurse Practitioners) who all work together to provide you with the care you need, when you need it.  We recommend signing up for the patient portal called "MyChart".  Sign up information is provided on this After Visit Summary.  MyChart is used to connect with patients for Virtual Visits (Telemedicine).  Patients are able to view lab/test results, encounter notes, upcoming appointments, etc.  Non-urgent messages can be sent to your provider as well.   To learn more about what you can do with MyChart, go to [email or web address].    Your next appointment:   12 month(s)  Provider:   Quay Burow, MD

## 2022-10-25 NOTE — Assessment & Plan Note (Signed)
History of hyperlipidemia previously on high-dose statin therapy with lipid profile performed 05/20/2022 revealing total cholesterol 171, LDL 61 and HDL of 103.  This was discontinued because of elevated LFTs.  We will recheck a lipid liver profile today.

## 2022-10-28 ENCOUNTER — Other Ambulatory Visit: Payer: Self-pay | Admitting: General Practice

## 2022-10-28 ENCOUNTER — Ambulatory Visit (HOSPITAL_COMMUNITY): Payer: PPO | Attending: Internal Medicine

## 2022-10-28 DIAGNOSIS — I480 Paroxysmal atrial fibrillation: Secondary | ICD-10-CM | POA: Diagnosis not present

## 2022-10-28 DIAGNOSIS — I639 Cerebral infarction, unspecified: Secondary | ICD-10-CM | POA: Diagnosis not present

## 2022-10-28 LAB — ECHOCARDIOGRAM COMPLETE
Area-P 1/2: 3.65 cm2
MV M vel: 4.79 m/s
MV Peak grad: 91.8 mmHg
S' Lateral: 3.3 cm

## 2022-10-28 NOTE — Telephone Encounter (Signed)
Prescription refill request for Eliquis received. Indication: Afib  Last office visit: 10/25/22 Gwenlyn Found)  Scr: 0.87 (09/07/22)  Age: 72 Weight: 89.3kg  Appropriate dose. Refill sent

## 2022-11-04 ENCOUNTER — Other Ambulatory Visit: Payer: Self-pay

## 2022-11-04 DIAGNOSIS — E782 Mixed hyperlipidemia: Secondary | ICD-10-CM

## 2022-11-14 ENCOUNTER — Ambulatory Visit: Payer: PPO

## 2022-11-14 DIAGNOSIS — I639 Cerebral infarction, unspecified: Secondary | ICD-10-CM

## 2022-11-15 LAB — CUP PACEART REMOTE DEVICE CHECK
Date Time Interrogation Session: 20240310231721
Implantable Pulse Generator Implant Date: 20220214

## 2022-11-22 ENCOUNTER — Ambulatory Visit (INDEPENDENT_AMBULATORY_CARE_PROVIDER_SITE_OTHER): Payer: PPO | Admitting: Physician Assistant

## 2022-11-22 ENCOUNTER — Encounter: Payer: Self-pay | Admitting: Physician Assistant

## 2022-11-22 VITALS — BP 120/66 | HR 58 | Temp 96.9°F | Ht 61.0 in | Wt 196.8 lb

## 2022-11-22 DIAGNOSIS — E559 Vitamin D deficiency, unspecified: Secondary | ICD-10-CM | POA: Diagnosis not present

## 2022-11-22 DIAGNOSIS — R7303 Prediabetes: Secondary | ICD-10-CM | POA: Diagnosis not present

## 2022-11-22 DIAGNOSIS — E782 Mixed hyperlipidemia: Secondary | ICD-10-CM

## 2022-11-22 DIAGNOSIS — I480 Paroxysmal atrial fibrillation: Secondary | ICD-10-CM | POA: Diagnosis not present

## 2022-11-22 DIAGNOSIS — Z23 Encounter for immunization: Secondary | ICD-10-CM

## 2022-11-22 DIAGNOSIS — N958 Other specified menopausal and perimenopausal disorders: Secondary | ICD-10-CM | POA: Diagnosis not present

## 2022-11-22 MED ORDER — CYCLOBENZAPRINE HCL 5 MG PO TABS: 5.0000 mg | ORAL_TABLET | Freq: Every day | ORAL | 1 refills | Status: DC

## 2022-11-22 NOTE — Progress Notes (Signed)
Carelink Summary Report / Loop Recorder 

## 2022-11-22 NOTE — Progress Notes (Signed)
Established Patient Office Visit  Subjective:  Patient ID: Tonya Graves, female    DOB: [birth year]  Age: 72 y.o. MRN: [id number]  CC:  Chief Complaint  Patient presents with   Hyperlipidemia    HPI Tonya Graves presents for chronic follow up  Pt with history of hyperlipidemia.  She had stopped medication several months ago after having significantly elevated liver enzymes.  Her liver enzymes corrected but she has not restarted medication.  She recently saw cardiology (Dr Gwenlyn Found) who repeated lipid panel on 2/20 and it was noted that chol was 234  and LDL 120 - she states she has appt on 4/5 to talk to pharmacist about starting injectable therapy  Pt with history of GERD - stable on Protonix 40mg  qd  Pt with history of vit D def - taking weekly supplement - due to recheck lab  Pt with history of PAF - currently follows with cardiology and stable on Eliquis 5mg  qd  Pt would like to schedule bone scan - was scheduled but missed appt due to fact husband recently passed away States she is doing ok - does have family and friend support  Past Medical History:  Diagnosis Date   Arthritis    OA   Atrial fibrillation ([place])    Bronchitis    Cataracts, bilateral    Dysrhythmia    history Vtac   GERD (gastroesophageal reflux disease)    Heart palpitations 2016 august   High cholesterol    History of hiatal hernia    Osteopenia    Prediabetes    Stroke [facility])    Urine frequency    UTI (urinary tract infection)    Varicose veins    Ventricular tachycardia (paroxysmal) ([place]) 08/2019   30 day monitor.    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   Procedure: RIGHT TOTAL KNEE ARTHROPLASTY;  Surgeon: Gaynelle Arabian, MD;  Location: [facility];  Service: Orthopedics;  Laterality: Right;   TOTAL KNEE ARTHROPLASTY Left 06/06/2016    Procedure: LEFT TOTAL KNEE ARTHROPLASTY;  Surgeon: Gaynelle Arabian, MD;  Location: [facility];  Service: Orthopedics;  Laterality: Left;   XI ROBOTIC ASSISTED HIATAL HERNIA REPAIR N/A 01/03/2022   Procedure: XI ROBOTIC TAKEDOWN HIATAL HERNIA with fundoplication;  Surgeon: Johnathan Hausen, MD;  Location: [facility];  Service: General;  Laterality: N/A;    Family History  Problem Relation Age of Onset   High blood pressure Mother    Atrial fibrillation Mother    Stroke Father    Heart disease Father    High Cholesterol Father    Atrial fibrillation Sister    Cancer Maternal Grandmother    Leukemia Maternal Grandmother    Other Maternal Grandfather        [place] spotted fever   Stroke Paternal Grandmother     Social History   Socioeconomic History   Marital status: Married    Spouse name: Not on file   Number of children: Not on file   Years of education: Not on file   Highest education level: Not on file  Occupational History   Not on file  Tobacco Use   Smoking status: Never   Smokeless tobacco: Never  Vaping Use   Vaping Use: Never used  Substance and Sexual Activity  Alcohol use: No   Drug use: No   Sexual activity: Not on file  Other Topics Concern   Not on file  Social History Narrative   Right handed   Caffeine 1 every week   One story home with husband   Social Determinants of Health   Financial Resource Strain: Low Risk  (05/17/2021)   Overall Financial Resource Strain (CARDIA)    Difficulty of Paying Living Expenses: Not hard at all  Food Insecurity: No Food Insecurity (05/17/2021)   Hunger Vital Sign    Worried About Running Out of Food in the Last Year: Never true    Ran Out of Food in the Last Year: Never true  Transportation Needs: No Transportation Needs (05/17/2021)   PRAPARE - Hydrologist (Medical): No    Lack of Transportation (Non-Medical): No  Physical Activity: Inactive (05/17/2021)   Exercise Vital Sign    Days of  Exercise per Week: 0 days    Minutes of Exercise per Session: 0 min  Stress: Stress Concern Present (05/17/2021)   [place]    Feeling of Stress : Rather much  Social Connections: Unknown (05/17/2021)   Social Connection and Isolation Panel [NHANES]    Frequency of Communication with Friends and Family: More than three times a week    Frequency of Social Gatherings with Friends and Family: More than three times a week    Attends Religious Services: Not on file    Active Member of Clubs or Organizations: Yes    Attends Archivist Meetings: More than 4 times per year    Marital Status: Married  Human resources officer Violence: Not At Risk (05/17/2021)   Humiliation, Afraid, Rape, and Kick questionnaire    Fear of Current or Ex-Partner: No    Emotionally Abused: No    Physically Abused: No    Sexually Abused: No     Current Outpatient Medications:    Calcium Carbonate-Vitamin D (CALCIUM 500 + D PO), Take 1 tablet by mouth 2 (two) times daily., Disp: , Rfl:    ELIQUIS 5 MG TABS tablet, TAKE 1 TABLET BY MOUTH TWICE A DAY, Disp: 180 tablet, Rfl: 1   furosemide (LASIX) 20 MG tablet, Take 1 tablet (20 mg total) by mouth daily as needed for edema., Disp: 90 tablet, Rfl: 1   loratadine (CLARITIN) 10 MG tablet, Take 10 mg by mouth at bedtime., Disp: , Rfl:    metoprolol tartrate (LOPRESSOR) 25 MG tablet, Take 1 tablet (25 mg total) by mouth 2 (two) times daily. 1 po bid, Disp: 180 tablet, Rfl: 1   pantoprazole (PROTONIX) 40 MG tablet, TAKE 1 TABLET BY MOUTH EVERY DAY, Disp: 90 tablet, Rfl: 3   potassium chloride (KLOR-CON M) 10 MEQ tablet, Take 1 tablet (10 mEq total) by mouth daily. When taking furosemide, Disp: 90 tablet, Rfl: 1   Propylene Glycol (SYSTANE COMPLETE OP), Place 1 drop into both eyes daily., Disp: , Rfl:    Vitamin D, Ergocalciferol, (DRISDOL) 1.25 MG (50000 UNIT) CAPS capsule, TAKE 1 CAPSULE BY MOUTH ONE TIME  PER WEEK, Disp: 12 capsule, Rfl: 5   Wheat Dextrin (BENEFIBER) POWD, Take 1 Scoop by mouth at bedtime., Disp: , Rfl:    cyclobenzaprine (FLEXERIL) 5 MG tablet, Take 1 tablet (5 mg total) by mouth at bedtime., Disp: 30 tablet, Rfl: 1   No Known Allergies  CONSTITUTIONAL: Negative for chills, fatigue, fever,   CARDIOVASCULAR: Negative for  chest pain, dizziness, palpitations and pedal edema.  RESPIRATORY: Negative for recent cough and dyspnea.  GASTROINTESTINAL: Negative for abdominal pain, acid reflux symptoms, constipation, diarrhea, nausea and vomiting.  INTEGUMENTARY: Negative for rash.  NEUROLOGICAL: Negative for dizziness and headaches.      Objective:    PHYSICAL EXAM:   VS: BP 120/66 (BP Location: Right Arm, Patient Position: Sitting, Cuff Size: Large)   Pulse (!) 58   Temp (!) 96.9 F (36.1 C) (Temporal)   Ht 5\' 1"  (1.549 m)   Wt 196 lb 12.8 oz (89.3 kg)   SpO2 100%   BMI 37.19 kg/m   GEN: Well nourished, well developed, in no acute distress  Cardiac: RRR; no murmurs, rubs, or gallops,no edema -  Respiratory:  normal respiratory rate and pattern with no distress - normal breath sounds with no rales, rhonchi, wheezes or rubs Skin: warm and dry, no rash  Psych: euthymic mood, appropriate affect and demeanor     Health Maintenance Due  Topic Date Due   Zoster Vaccines- Shingrix (1 of 2) Never done   DEXA SCAN  10/01/2020    There are no preventive care reminders to display for this patient.  Lab Results  Component Value Date   TSH 2.090 05/20/2022   Lab Results  Component Value Date   WBC 3.8 05/20/2022   HGB 11.3 05/20/2022   HCT 34.8 05/20/2022   MCV 88 05/20/2022   PLT 211 05/20/2022   Lab Results  Component Value Date   NA 141 09/07/2022   K 4.2 09/07/2022   CO2 25 09/07/2022   GLUCOSE 95 09/07/2022   BUN 22 09/07/2022   CREATININE 0.87 09/07/2022   BILITOT 0.3 10/25/2022   ALKPHOS 75 10/25/2022   AST 20 10/25/2022   ALT 14 10/25/2022    PROT 6.6 10/25/2022   ALBUMIN 3.9 10/25/2022   CALCIUM 9.1 09/07/2022   ANIONGAP 5 01/05/2022   EGFR 72 09/07/2022   Lab Results  Component Value Date   CHOL 234 (H) 10/25/2022   Lab Results  Component Value Date   HDL 102 10/25/2022   Lab Results  Component Value Date   LDLCALC 120 (H) 10/25/2022   Lab Results  Component Value Date   TRIG 71 10/25/2022   Lab Results  Component Value Date   CHOLHDL 2.3 10/25/2022   Lab Results  Component Value Date   HGBA1C 6.1 (H) 12/16/2021      Assessment & Plan:   Problem List Items Addressed This Visit       Cardiovascular and Mediastinum   PAF (paroxysmal atrial fibrillation) ([place])   Relevant Orders   CBC with Differential/Platelet   Comprehensive metabolic panel   TSH Continue med Follow up with cardiology     Other   Mixed hyperlipidemia - Primary   Relevant Orders   Comprehensive metabolic panel Follow up with Dr Gwenlyn Found   Vitamin D insufficiency   Relevant Orders   VITAMIN D 25 Hydroxy (Vit-D Deficiency, Fractures)   Other Visit Diagnoses     Prediabetes       Relevant Orders   Hemoglobin A1c Watch diet   Encounter for immunization       Relevant Orders   Pfizer Fall 2023 Covid-19 Vaccine 59yrs and older (Completed)   Other specified menopausal and perimenopausal disorders       Relevant Orders   DG Bone Density       Meds ordered this encounter  Medications   cyclobenzaprine (FLEXERIL) 5 MG tablet  Sig: Take 1 tablet (5 mg total) by mouth at bedtime.    Dispense:  30 tablet    Refill:  1    Order Specific Question:   Supervising Provider    AnswerShelton Silvas    Follow-up: Return in about 6 months (around 05/25/2023) for chronic fasting follow up.    SARA R Moncerrat Burnstein, PA-C

## 2022-11-23 LAB — CBC WITH DIFFERENTIAL/PLATELET
Basophils Absolute: 0 10*3/uL (ref 0.0–0.2)
Basos: 1 %
EOS (ABSOLUTE): 0.2 10*3/uL (ref 0.0–0.4)
Eos: 4 %
Hematocrit: 36.5 % (ref 34.0–46.6)
Hemoglobin: 12.2 g/dL (ref 11.1–15.9)
Immature Grans (Abs): 0 10*3/uL (ref 0.0–0.1)
Immature Granulocytes: 0 %
Lymphocytes Absolute: 1.4 10*3/uL (ref 0.7–3.1)
Lymphs: 31 %
MCH: 28 pg (ref 26.6–33.0)
MCHC: 33.4 g/dL (ref 31.5–35.7)
MCV: 84 fL (ref 79–97)
Monocytes Absolute: 0.5 10*3/uL (ref 0.1–0.9)
Monocytes: 10 %
Neutrophils Absolute: 2.4 10*3/uL (ref 1.4–7.0)
Neutrophils: 54 %
Platelets: 228 10*3/uL (ref 150–450)
RBC: 4.35 x10E6/uL (ref 3.77–5.28)
RDW: 14.4 % (ref 11.7–15.4)
WBC: 4.4 10*3/uL (ref 3.4–10.8)

## 2022-11-23 LAB — COMPREHENSIVE METABOLIC PANEL
ALT: 12 IU/L (ref 0–32)
AST: 17 IU/L (ref 0–40)
Albumin/Globulin Ratio: 1.6 (ref 1.2–2.2)
Albumin: 4.1 g/dL (ref 3.9–4.9)
Alkaline Phosphatase: 80 IU/L (ref 44–121)
BUN/Creatinine Ratio: 26 (ref 12–28)
BUN: 22 mg/dL (ref 8–27)
Bilirubin Total: 0.3 mg/dL (ref 0.0–1.2)
CO2: 23 mmol/L (ref 20–29)
Calcium: 9.3 mg/dL (ref 8.7–10.3)
Chloride: 103 mmol/L (ref 96–106)
Creatinine, Ser: 0.84 mg/dL (ref 0.57–1.00)
Globulin, Total: 2.5 g/dL (ref 1.5–4.5)
Glucose: 100 mg/dL — ABNORMAL HIGH (ref 70–99)
Potassium: 4.7 mmol/L (ref 3.5–5.2)
Sodium: 141 mmol/L (ref 134–144)
Total Protein: 6.6 g/dL (ref 6.0–8.5)
eGFR: 75 mL/min/{1.73_m2} (ref >59)

## 2022-11-23 LAB — HEMOGLOBIN A1C
Est. average glucose Bld gHb Est-mCnc: 120 mg/dL
Hgb A1c MFr Bld: 5.8 % — ABNORMAL HIGH (ref 4.8–5.6)

## 2022-11-23 LAB — VITAMIN D 25 HYDROXY (VIT D DEFICIENCY, FRACTURES): Vit D, 25-Hydroxy: 61.1 ng/mL (ref 30.0–100.0)

## 2022-11-23 LAB — TSH: TSH: 2.43 u[IU]/mL (ref 0.450–4.500)

## 2022-11-24 ENCOUNTER — Telehealth: Payer: Self-pay

## 2022-11-24 NOTE — Telephone Encounter (Signed)
Patient called stating that she had received a covid shot at her last appointment and that at the injection site she had some redness and it spread across her arm 2-3 inches wide and was raised 1/4 inches off the arm. Spoke with patient's provider and told the patient that this is a common reaction to this vaccine and that she recommends the patient to apply a cold compress to the area and that should help and if she has any questions or concerns to give [place] a call back.

## 2022-11-25 NOTE — Telephone Encounter (Signed)
Patient called again this morning stating that the injection site is still swelled and red and itching . I asked her if the redness has increased or is worse, she states no that it is not and the ice packs have helped. She states that she just wanted sally to know how it was now and see if she recommends anything different before the weekend.

## 2022-11-25 NOTE — Telephone Encounter (Signed)
Patient informed. 

## 2022-11-28 DIAGNOSIS — Z01419 Encounter for gynecological examination (general) (routine) without abnormal findings: Secondary | ICD-10-CM | POA: Diagnosis not present

## 2022-11-28 DIAGNOSIS — Z1231 Encounter for screening mammogram for malignant neoplasm of breast: Secondary | ICD-10-CM | POA: Diagnosis not present

## 2022-11-28 DIAGNOSIS — Z124 Encounter for screening for malignant neoplasm of cervix: Secondary | ICD-10-CM | POA: Diagnosis not present

## 2022-11-28 DIAGNOSIS — Z6836 Body mass index (BMI) 36.0-36.9, adult: Secondary | ICD-10-CM | POA: Diagnosis not present

## 2022-11-28 LAB — HM PAP SMEAR

## 2022-11-30 DIAGNOSIS — Z961 Presence of intraocular lens: Secondary | ICD-10-CM | POA: Diagnosis not present

## 2022-11-30 DIAGNOSIS — H53461 Homonymous bilateral field defects, right side: Secondary | ICD-10-CM | POA: Diagnosis not present

## 2022-11-30 LAB — HM MAMMOGRAPHY

## 2022-12-05 ENCOUNTER — Encounter: Payer: Self-pay | Admitting: Physician Assistant

## 2022-12-08 ENCOUNTER — Encounter: Payer: Self-pay | Admitting: Physician Assistant

## 2022-12-09 ENCOUNTER — Ambulatory Visit: Payer: PPO | Attending: Cardiovascular Disease | Admitting: Pharmacist Clinician (PhC)/ Clinical Pharmacy Specialist

## 2022-12-09 ENCOUNTER — Encounter: Payer: Self-pay | Admitting: Pharmacist Clinician (PhC)/ Clinical Pharmacy Specialist

## 2022-12-09 ENCOUNTER — Other Ambulatory Visit (HOSPITAL_COMMUNITY): Payer: Self-pay

## 2022-12-09 ENCOUNTER — Telehealth: Payer: Self-pay | Admitting: Pharmacist Clinician (PhC)/ Clinical Pharmacy Specialist

## 2022-12-09 DIAGNOSIS — E782 Mixed hyperlipidemia: Secondary | ICD-10-CM

## 2022-12-09 NOTE — Progress Notes (Signed)
Office Visit    Patient Name: Tonya Graves Date of Encounter: 12/09/2022  Primary Care Provider:  Marianne Sofia, PA-C Primary Cardiologist:  Nanetta Batty, MD  Chief Complaint    Hyperlipidemia   Significant Past Medical History   CAD Calcium score 33 = 58th percentile  CVA 11/21 - loop recorder implanted after  Pre DM A1c 5.8 (down from 6.1)  AF Paroxysmal, CHADS2-VASc = 5, on Eliquis  CHF Chronic diastolic     No Known Allergies  History of Present Illness    Tonya Graves is a 72 y.o. female patient of Dr Allyson Sabal, in the office today to discuss options for cholesterol lowering.  She has a history ov CVA back in 2021 and has recovered well.  She took atorvastatin for about 2 years, then had a an increase in AST and ALT to 1074 and 921 respectively.   Atorvastatin was discontinued and she has been advised to avoid all statin drugs.    Insurance Carrier:  Manufacturing engineer Plan 1 ($47/mo $94/3 months;  133.50/mo in coverage gap)  LDL Cholesterol goal:  LDL < 55  Current Medications:   none  Previously tried:  atorvastatin 80 mg - elevated liver enzymes  Family Hx:  AF in mother and sister, heart disease, high cholesterol and stroke in father, 2 children healthy  Social Hx: Tobacco:no Alcohol:  no  Diet:    mix of home and eating out; eating out is mix of restaurant and fast food, she lost her husband earlier this year and has not had much of an appetite, but notes that it is starting to come back.    Exercise: keeps up yard   Adherence Assessment  Do you ever forget to take your medication? [] Yes [x] No  Do you ever skip doses due to side effects? [] Yes [x] No  Do you have trouble affording your medicines? [] Yes [x] No  Are you ever unable to pick up your medication due to transportation difficulties? [] Yes [x] No  Do you ever stop taking your medications because you don't believe they are helping? [] Yes [x] No   Adherence strategy: 7 day pill  minder  Accessory Clinical Findings   Lab Results  Component Value Date   CHOL 234 (H) 10/25/2022   HDL 102 10/25/2022   LDLCALC 120 (H) 10/25/2022   TRIG 71 10/25/2022   CHOLHDL 2.3 10/25/2022    Lab Results  Component Value Date   ALT 12 11/22/2022   AST 17 11/22/2022   ALKPHOS 80 11/22/2022   BILITOT 0.3 11/22/2022   Lab Results  Component Value Date   CREATININE 0.84 11/22/2022   BUN 22 11/22/2022   NA 141 11/22/2022   K 4.7 11/22/2022   CL 103 11/22/2022   CO2 23 11/22/2022   Lab Results  Component Value Date   HGBA1C 5.8 (H) 11/22/2022    Home Medications    Current Outpatient Medications  Medication Sig Dispense Refill   Calcium Carbonate-Vitamin D (CALCIUM 500 + D PO) Take 1 tablet by mouth 2 (two) times daily.     cyclobenzaprine (FLEXERIL) 5 MG tablet Take 1 tablet (5 mg total) by mouth at bedtime. 30 tablet 1   ELIQUIS 5 MG TABS tablet TAKE 1 TABLET BY MOUTH TWICE A DAY 180 tablet 1   furosemide (LASIX) 20 MG tablet Take 1 tablet (20 mg total) by mouth daily as needed for edema. 90 tablet 1   loratadine (CLARITIN) 10 MG tablet Take 10 mg by mouth at bedtime.  metoprolol tartrate (LOPRESSOR) 25 MG tablet Take 1 tablet (25 mg total) by mouth 2 (two) times daily. 1 po bid 180 tablet 1   pantoprazole (PROTONIX) 40 MG tablet TAKE 1 TABLET BY MOUTH EVERY DAY 90 tablet 3   potassium chloride (KLOR-CON M) 10 MEQ tablet Take 1 tablet (10 mEq total) by mouth daily. When taking furosemide 90 tablet 1   Propylene Glycol (SYSTANE COMPLETE OP) Place 1 drop into both eyes daily.     Vitamin D, Ergocalciferol, (DRISDOL) 1.25 MG (50000 UNIT) CAPS capsule TAKE 1 CAPSULE BY MOUTH ONE TIME PER WEEK 12 capsule 5   Wheat Dextrin (BENEFIBER) POWD Take 1 Scoop by mouth at bedtime.     No current facility-administered medications for this visit.     Assessment & Plan    Mixed hyperlipidemia Assessment: Patient with ASCVD not at LDL goal of < 55 Most recent LDL 120 on  10/25/22 Not able to tolerate statins secondary to elevated LFT's to > 5 x UNL Reviewed options for lowering LDL cholesterol, including ezetimibe, PCSK-9 inhibitors, bempedoic acid and inclisiran.  Discussed mechanisms of action, dosing, side effects, potential decreases in LDL cholesterol and costs.  Also reviewed potential options for patient assistance.  Plan: Patient agreeable to starting Repatha 140 mg q14d Repeat labs after:  3 months Lipid Liver function Patient was given information on [employer] grant - will call patient to set up account once Repatha approved   Phillips HayKristin Lenya Sterne, PharmD CPP [facility] [address]  [place], [place] [id number] [phone]  12/09/2022, 11:57 AM

## 2022-12-09 NOTE — Patient Instructions (Signed)
Your Results:             Your most recent labs Goal  Total Cholesterol 234 < 200  Triglycerides 71 < 150  HDL (happy/good cholesterol) 102 > 40  LDL (lousy/bad cholesterol 120 < 70   Medication changes:  We will start the process to get Repatha covered by your insurance.  Once the prior authorization is complete, I will call/send a MyChart message to let you know and confirm pharmacy information.   You will take one injection every 14 days  Lab orders:  We want to repeat labs after 2-3 months.  We will send you a lab order to remind you once we get closer to that time.    Patient Assistance:  The [employer] foundation offers assistance to help pay for medication copays.  They will cover copays for all cholesterol lowering meds, including statins, fibrates, omega-3 oils, ezetimibe, Repatha, Praluent, Nexletol, Nexlizet.  The cards are usually good for $2,500 or 12 months, whichever comes first. Go to healthwellfoundation.org Click on "Apply Now" Answer questions as to whom is applying (patient or representative) Your disease fund will be "hypercholesterolemia - Medicare access" Select the cholesterol medication you need assistance with (Repatha, Praluent, Nexlizet...) They will ask question about qualifying diagnosis - you can mark "yes"; and do you have insurance coverage.   When they ask what type of assistance you are interested in - "copay assistance" When you submit, the approval is usually within minutes.  You will need to print the card information from the site You will need to show this information to your pharmacy, they will bill your Medicare Part D plan first -then bill [employer] --for the copay.   You can also call them at [phone], although the hold times can be quite long.   Thank you for choosing [facility]

## 2022-12-09 NOTE — Assessment & Plan Note (Signed)
Assessment: Patient with ASCVD not at LDL goal of < 55 Most recent LDL 120 on 10/25/22 Not able to tolerate statins secondary to elevated LFT's to > 5 x UNL Reviewed options for lowering LDL cholesterol, including ezetimibe, PCSK-9 inhibitors, bempedoic acid and inclisiran.  Discussed mechanisms of action, dosing, side effects, potential decreases in LDL cholesterol and costs.  Also reviewed potential options for patient assistance.  Plan: Patient agreeable to starting Repatha 140 mg q14d Repeat labs after:  3 months Lipid Liver function Patient was given information on [employer] - will call patient to set up account once Repatha approved

## 2022-12-09 NOTE — Telephone Encounter (Signed)
Please start PA for Repatha 140 mg q14 d   

## 2022-12-12 ENCOUNTER — Other Ambulatory Visit (HOSPITAL_COMMUNITY): Payer: Self-pay

## 2022-12-12 ENCOUNTER — Telehealth: Payer: Self-pay

## 2022-12-12 NOTE — Telephone Encounter (Signed)
Pharmacy Patient Advocate Encounter   Received notification from [employer] that prior authorization for REPATHA is needed.    PA FAXED on 12/12/22  Status is pending  Haze Rushing, CPhT Pharmacy Patient Advocate Specialist Direct Number: [phone] Fax: [phone]

## 2022-12-13 MED ORDER — REPATHA SURECLICK 140 MG/ML ~~LOC~~ SOAJ: 140.0000 mg | SUBCUTANEOUS | 3 refills | Status: DC

## 2022-12-13 NOTE — Telephone Encounter (Signed)
Pharmacy Patient Advocate Encounter  Prior Authorization for REPATHA has been approved.    Effective dates: 12/12/22 through 06/13/23  Tonya Graves, CPhT Pharmacy Patient Advocate Specialist Direct Number: [phone] Fax: [phone]

## 2022-12-13 NOTE — Telephone Encounter (Signed)
PA for Repatha approved  [employer] grant approved  ID [id number] Exp 11/12/23  [facility] for patient to call and get grant information.  Was also sent to [facility] with prescription

## 2022-12-14 NOTE — Telephone Encounter (Signed)
Shared information with patient.  Will order labs to be repeated in 3 months, mail order to patient at that time

## 2022-12-19 ENCOUNTER — Ambulatory Visit (INDEPENDENT_AMBULATORY_CARE_PROVIDER_SITE_OTHER): Payer: PPO

## 2022-12-19 DIAGNOSIS — I639 Cerebral infarction, unspecified: Secondary | ICD-10-CM | POA: Diagnosis not present

## 2022-12-19 LAB — CUP PACEART REMOTE DEVICE CHECK
Date Time Interrogation Session: 20240414231629
Implantable Pulse Generator Implant Date: 20220214

## 2022-12-21 NOTE — Progress Notes (Signed)
Carelink Summary Report / Loop Recorder 

## 2023-01-19 ENCOUNTER — Other Ambulatory Visit: Payer: Self-pay

## 2023-01-19 MED ORDER — VITAMIN D (ERGOCALCIFEROL) 1.25 MG (50000 UNIT) PO CAPS: ORAL_CAPSULE | ORAL | 5 refills | Status: DC

## 2023-01-20 NOTE — Progress Notes (Signed)
Carelink Summary Report / Loop Recorder 

## 2023-01-23 ENCOUNTER — Ambulatory Visit (INDEPENDENT_AMBULATORY_CARE_PROVIDER_SITE_OTHER): Payer: PPO

## 2023-01-23 DIAGNOSIS — I639 Cerebral infarction, unspecified: Secondary | ICD-10-CM | POA: Diagnosis not present

## 2023-01-23 LAB — CUP PACEART REMOTE DEVICE CHECK
Date Time Interrogation Session: 20240519231449
Implantable Pulse Generator Implant Date: 20220214

## 2023-01-28 ENCOUNTER — Other Ambulatory Visit: Payer: Self-pay | Admitting: Physician Assistant

## 2023-01-28 DIAGNOSIS — I4729 Other ventricular tachycardia: Secondary | ICD-10-CM

## 2023-02-01 DIAGNOSIS — R7989 Other specified abnormal findings of blood chemistry: Secondary | ICD-10-CM | POA: Diagnosis not present

## 2023-02-01 DIAGNOSIS — K5790 Diverticulosis of intestine, part unspecified, without perforation or abscess without bleeding: Secondary | ICD-10-CM | POA: Diagnosis not present

## 2023-02-01 DIAGNOSIS — D649 Anemia, unspecified: Secondary | ICD-10-CM | POA: Diagnosis not present

## 2023-02-01 DIAGNOSIS — K219 Gastro-esophageal reflux disease without esophagitis: Secondary | ICD-10-CM | POA: Diagnosis not present

## 2023-02-01 DIAGNOSIS — R748 Abnormal levels of other serum enzymes: Secondary | ICD-10-CM | POA: Diagnosis not present

## 2023-02-03 ENCOUNTER — Encounter: Payer: Self-pay | Admitting: Physician Assistant

## 2023-02-03 DIAGNOSIS — N959 Unspecified menopausal and perimenopausal disorder: Secondary | ICD-10-CM | POA: Diagnosis not present

## 2023-02-03 DIAGNOSIS — M8580 Other specified disorders of bone density and structure, unspecified site: Secondary | ICD-10-CM | POA: Diagnosis not present

## 2023-02-03 DIAGNOSIS — M8589 Other specified disorders of bone density and structure, multiple sites: Secondary | ICD-10-CM | POA: Diagnosis not present

## 2023-02-03 LAB — HM DEXA SCAN

## 2023-02-17 NOTE — Progress Notes (Signed)
Carelink Summary Report / Loop Recorder 

## 2023-02-27 ENCOUNTER — Ambulatory Visit (INDEPENDENT_AMBULATORY_CARE_PROVIDER_SITE_OTHER): Payer: PPO

## 2023-02-27 DIAGNOSIS — I639 Cerebral infarction, unspecified: Secondary | ICD-10-CM

## 2023-02-27 LAB — CUP PACEART REMOTE DEVICE CHECK
Date Time Interrogation Session: 20240623231101
Implantable Pulse Generator Implant Date: 20220214

## 2023-03-13 DIAGNOSIS — K219 Gastro-esophageal reflux disease without esophagitis: Secondary | ICD-10-CM | POA: Diagnosis not present

## 2023-03-13 DIAGNOSIS — R748 Abnormal levels of other serum enzymes: Secondary | ICD-10-CM | POA: Diagnosis not present

## 2023-03-13 DIAGNOSIS — K5790 Diverticulosis of intestine, part unspecified, without perforation or abscess without bleeding: Secondary | ICD-10-CM | POA: Diagnosis not present

## 2023-03-13 DIAGNOSIS — D649 Anemia, unspecified: Secondary | ICD-10-CM | POA: Diagnosis not present

## 2023-03-16 NOTE — Progress Notes (Signed)
Carelink Summary Report / Loop Recorder 

## 2023-04-03 ENCOUNTER — Ambulatory Visit: Payer: PPO

## 2023-04-03 ENCOUNTER — Other Ambulatory Visit: Payer: Self-pay | Admitting: Cardiovascular Disease

## 2023-04-03 DIAGNOSIS — I639 Cerebral infarction, unspecified: Secondary | ICD-10-CM

## 2023-04-03 DIAGNOSIS — I480 Paroxysmal atrial fibrillation: Secondary | ICD-10-CM

## 2023-04-03 NOTE — Telephone Encounter (Signed)
Prescription refill request for Eliquis received. Indication:afib Last office visit:4/24 Scr:0.84  3/24 Age: 72 Weight:89.3  kg  Prescription refilled

## 2023-04-06 ENCOUNTER — Ambulatory Visit: Payer: PPO | Attending: Cardiology | Admitting: Cardiology

## 2023-04-06 ENCOUNTER — Encounter: Payer: Self-pay | Admitting: Cardiology

## 2023-04-06 VITALS — BP 132/70 | HR 60 | Ht 61.0 in | Wt 203.0 lb

## 2023-04-06 DIAGNOSIS — I480 Paroxysmal atrial fibrillation: Secondary | ICD-10-CM | POA: Diagnosis not present

## 2023-04-06 DIAGNOSIS — I4892 Unspecified atrial flutter: Secondary | ICD-10-CM | POA: Diagnosis not present

## 2023-04-06 DIAGNOSIS — I639 Cerebral infarction, unspecified: Secondary | ICD-10-CM

## 2023-04-06 NOTE — Progress Notes (Signed)
  Electrophysiology Office Follow up Visit Note:    Date:  04/06/2023   ID:  Tonya Graves, DOB 11-16-1950, MRN [id number]  PCP:  Marianne Sofia, PA-C  [facility] Cardiologist:  Nanetta Batty, MD  [facility] Electrophysiologist:  Lanier Prude, MD    Interval History:    Tonya Graves is a 72 y.o. female who presents for a follow up visit.   Last seen September 30, 2022 for atrial fibrillation.  She is on Eliquis for stroke prophylaxis.  After our last appointment we repeated her echocardiogram.  We had previously discussed antiarrhythmics or catheter ablation for her atrial fibrillation.  She has been doing well without recurrence of atrial fibrillation.  She checks her blood pressure frequently at home and is typically in the 130s systolic.  She gets out to do yard work and work around [employer] but does not have a regular exercise routine.     Past medical, surgical, social and family history were reviewed.  ROS:   Please see the history of present illness.    All other systems reviewed and are negative.  EKGs/Labs/Other Studies Reviewed:    The following studies were reviewed today:  October 28, 2022 echo EF 55-60 RV function normal Mild MR Moderate TR Mild AI Moderate PI       Physical Exam:    VS:  BP 132/70   Pulse 60   Ht 5\' 1"  (1.549 m)   Wt 203 lb (92.1 kg)   SpO2 99%   BMI 38.36 kg/m     Wt Readings from Last 3 Encounters:  04/06/23 203 lb (92.1 kg)  11/22/22 196 lb 12.8 oz (89.3 kg)  10/25/22 196 lb 12.8 oz (89.3 kg)     GEN:  Well nourished, well developed in no acute distress.  Obese CARDIAC: RRR, no murmurs, rubs, gallops RESPIRATORY:  Clear to auscultation without rales, wheezing or rhonchi       ASSESSMENT:    1. Cerebrovascular accident (CVA), unspecified mechanism (HCC)   2. PAF (paroxysmal atrial fibrillation) (HCC)   3. Atrial flutter, unspecified type (HCC)    PLAN:    In order of problems listed  above:   #CVA #Paroxysmal atrial fibrillation Continue Eliquis for secondary prophylaxis  Follow-up with me in 1 year      Signed, Steffanie Dunn, MD, [facility], [facility] 04/06/2023 10:35 AM    Electrophysiology [facility]

## 2023-04-06 NOTE — Patient Instructions (Signed)
Medication Instructions:  Your physician recommends that you continue on your current medications as directed. Please refer to the Current Medication list given to you today.  *If you need a refill on your cardiac medications before your next appointment, please call your pharmacy*  Follow-Up: At [facility], you and your health needs are our priority.  As part of our continuing mission to provide you with exceptional heart care, we have created designated Provider Care Teams.  These Care Teams include your primary Cardiologist (physician) and Advanced Practice Providers (APPs -  Physician Assistants and Nurse Practitioners) who all work together to provide you with the care you need, when you need it.  Your next appointment:   1 year(s)  Provider:   Cameron Lambert, MD    

## 2023-04-11 ENCOUNTER — Encounter: Payer: Self-pay | Admitting: Physician Assistant

## 2023-04-11 ENCOUNTER — Ambulatory Visit (INDEPENDENT_AMBULATORY_CARE_PROVIDER_SITE_OTHER): Payer: PPO | Admitting: Physician Assistant

## 2023-04-11 VITALS — BP 122/64 | HR 51 | Temp 97.5°F | Ht 61.0 in | Wt 205.0 lb

## 2023-04-11 DIAGNOSIS — N760 Acute vaginitis: Secondary | ICD-10-CM | POA: Diagnosis not present

## 2023-04-11 DIAGNOSIS — N491 Inflammatory disorders of spermatic cord, tunica vaginalis and vas deferens: Secondary | ICD-10-CM

## 2023-04-11 MED ORDER — CEPHALEXIN 500 MG PO CAPS
500.0000 mg | ORAL_CAPSULE | Freq: Two times a day (BID) | ORAL | 0 refills | Status: DC
Start: 2023-04-11 — End: 2023-05-18

## 2023-04-11 NOTE — Progress Notes (Signed)
Acute Office Visit  Subjective:    Patient ID: Tonya Graves, female    DOB: 09-Jun-1951, 72 y.o.   MRN: [id number]  Chief Complaint  Patient presents with   Vaginal Pain    HPI: Patient is in today for complaints of bump on labia.  She states it started last week after mowing in the heat.  Over the weekend it got worse and was painful on Sunday - yesterday woke up and it had actually ruptured and drained some pus and blood.  Today is feeling better and not painful.  Only minimal drainage Denies fever/malaise Denies urine or other vaginal symptoms   Current Outpatient Medications:    Calcium Carbonate-Vitamin D (CALCIUM 500 + D PO), Take 1 tablet by mouth 2 (two) times daily., Disp: , Rfl:    cephALEXin (KEFLEX) 500 MG capsule, Take 1 capsule (500 mg total) by mouth 2 (two) times daily., Disp: 20 capsule, Rfl: 0   cyclobenzaprine (FLEXERIL) 5 MG tablet, Take 1 tablet (5 mg total) by mouth at bedtime., Disp: 30 tablet, Rfl: 1   ELIQUIS 5 MG TABS tablet, TAKE 1 TABLET BY MOUTH TWICE A DAY, Disp: 180 tablet, Rfl: 1   Evolocumab (REPATHA SURECLICK) 140 MG/ML SOAJ, Inject 140 mg into the skin every 14 (fourteen) days., Disp: 6 mL, Rfl: 3   famotidine (PEPCID) 40 MG tablet, Take 40 mg by mouth daily., Disp: , Rfl:    furosemide (LASIX) 20 MG tablet, Take 1 tablet (20 mg total) by mouth daily as needed for edema., Disp: 90 tablet, Rfl: 1   loratadine (CLARITIN) 10 MG tablet, Take 10 mg by mouth at bedtime., Disp: , Rfl:    metoprolol tartrate (LOPRESSOR) 25 MG tablet, TAKE 1 TABLET (25 MG TOTAL) BY MOUTH 2 (TWO) TIMES DAILY., Disp: 180 tablet, Rfl: 1   nystatin cream (MYCOSTATIN), APPLY TO AFFECTED AREA TWICE A DAY, Disp: , Rfl:    pantoprazole (PROTONIX) 40 MG tablet, TAKE 1 TABLET BY MOUTH EVERY DAY, Disp: 90 tablet, Rfl: 3   potassium chloride (KLOR-CON M) 10 MEQ tablet, Take 1 tablet (10 mEq total) by mouth daily. When taking furosemide, Disp: 90 tablet, Rfl: 1   Propylene Glycol  (SYSTANE COMPLETE OP), Place 1 drop into both eyes daily., Disp: , Rfl:    triamcinolone cream (KENALOG) 0.1 %, APPLY THIN COAT TO AFFECTED AREA TWICE A DAY, Disp: , Rfl:    Vitamin D, Ergocalciferol, (DRISDOL) 1.25 MG (50000 UNIT) CAPS capsule, TAKE 1 CAPSULE BY MOUTH ONE TIME PER WEEK, Disp: 12 capsule, Rfl: 5   Wheat Dextrin (BENEFIBER) POWD, Take 1 Scoop by mouth at bedtime., Disp: , Rfl:   No Known Allergies  ROS CONSTITUTIONAL: Negative for chills, fatigue, fever, GU - see HPI Skin - see HPI     Objective:    PHYSICAL EXAM:   BP 122/64 (BP Location: Left Arm, Patient Position: Sitting, Cuff Size: Normal)   Pulse (!) 51   Temp (!) 97.5 F (36.4 C) (Temporal)   Ht 5\' 1"  (1.549 m)   Wt 205 lb (93 kg)   SpO2 100%   BMI 38.73 kg/m     GEN: Well nourished, well developed, in no acute distress  Cardiac: RRR; no murmurs,  Respiratory:  normal respiratory rate and pattern with no distress - normal breath sounds with no rales, rhonchi, wheezes or rubs GU - external genitalia shows small ruptured abscess inside of upper vaginal wall -- no induration noted     Assessment &  Plan:    Abscess of vagina -     Cephalexin; Take 1 capsule (500 mg total) by mouth 2 (two) times daily.  Dispense: 20 capsule; Refill: 0 Recommend warm sitz baths    Follow-up: Return if symptoms worsen or fail to improve.  An After Visit Summary was printed and given to the patient.  Jettie Pagan [facility] [phone]

## 2023-04-13 DIAGNOSIS — N760 Acute vaginitis: Secondary | ICD-10-CM | POA: Insufficient documentation

## 2023-04-18 NOTE — Progress Notes (Signed)
Carelink Summary Report / Loop Recorder 

## 2023-04-27 ENCOUNTER — Telehealth: Payer: Self-pay | Admitting: Cardiovascular Disease

## 2023-04-27 NOTE — Telephone Encounter (Signed)
New Message:     Tonya Graves would like to talk to someone who can help her with getting patient assistance for her Eliquis please.

## 2023-04-27 NOTE — Telephone Encounter (Signed)
Returned call to patient in regards to patient assistance for Eliquis. Pt would like for [place] to mail it to her. Advised pt it you be dropped in the mail today. Pt verbalized understanding.

## 2023-04-28 ENCOUNTER — Other Ambulatory Visit: Payer: Self-pay | Admitting: Physician Assistant

## 2023-04-28 DIAGNOSIS — I4729 Other ventricular tachycardia: Secondary | ICD-10-CM

## 2023-05-04 LAB — CUP PACEART REMOTE DEVICE CHECK
Date Time Interrogation Session: 20240828231303
Implantable Pulse Generator Implant Date: 20220214

## 2023-05-09 ENCOUNTER — Ambulatory Visit (INDEPENDENT_AMBULATORY_CARE_PROVIDER_SITE_OTHER): Payer: PPO

## 2023-05-09 DIAGNOSIS — I639 Cerebral infarction, unspecified: Secondary | ICD-10-CM

## 2023-05-17 NOTE — Progress Notes (Signed)
Carelink Summary Report / Loop Recorder 

## 2023-05-18 ENCOUNTER — Ambulatory Visit (INDEPENDENT_AMBULATORY_CARE_PROVIDER_SITE_OTHER): Payer: PPO

## 2023-05-18 ENCOUNTER — Encounter: Payer: Self-pay | Admitting: Podiatry

## 2023-05-18 ENCOUNTER — Other Ambulatory Visit: Payer: Self-pay | Admitting: Podiatry

## 2023-05-18 ENCOUNTER — Ambulatory Visit: Payer: PPO | Admitting: Podiatry

## 2023-05-18 DIAGNOSIS — M76819 Anterior tibial syndrome, unspecified leg: Secondary | ICD-10-CM

## 2023-05-18 DIAGNOSIS — M76821 Posterior tibial tendinitis, right leg: Secondary | ICD-10-CM

## 2023-05-18 DIAGNOSIS — M76822 Posterior tibial tendinitis, left leg: Secondary | ICD-10-CM

## 2023-05-18 DIAGNOSIS — Q666 Other congenital valgus deformities of feet: Secondary | ICD-10-CM

## 2023-05-18 DIAGNOSIS — M722 Plantar fascial fibromatosis: Secondary | ICD-10-CM

## 2023-05-18 DIAGNOSIS — M19079 Primary osteoarthritis, unspecified ankle and foot: Secondary | ICD-10-CM

## 2023-05-18 MED ORDER — METHYLPREDNISOLONE 4 MG PO TBPK: ORAL_TABLET | ORAL | 0 refills | Status: DC

## 2023-05-18 MED ORDER — TRIAMCINOLONE ACETONIDE 40 MG/ML IJ SUSP
40.0000 mg | Freq: Once | INTRAMUSCULAR | Status: AC
Start: 2023-05-18 — End: 2023-05-18
  Administered 2023-05-18: 40 mg

## 2023-05-21 NOTE — Progress Notes (Signed)
Subjective:  Patient ID: Tonya Graves, female    DOB: [birth year],  MRN: [id number] HPI Chief Complaint  Patient presents with   Foot Pain    Medial and dorsal midfoot/sometimes medial ankle bilateral - aching x 2 years, feet hurt any time walking a lot, tried multiple shoes-no help   Nail Problem    Toenails - concerned about the thickness and discoloration   New Patient (Initial Visit)    72 y.o. female presents with the above complaint.   ROS: Denies fever chills nausea vomit muscle aches pains calf pain back pain chest pain shortness of breath.  Past Medical History:  Diagnosis Date   Arthritis    OA   Atrial fibrillation (HCC)    Bronchitis    Cataracts, bilateral    Dysrhythmia    history Vtac   GERD (gastroesophageal reflux disease)    Heart palpitations 2016 august   High cholesterol    History of hiatal hernia    Osteopenia    Prediabetes    Stroke [facility])    Urine frequency    UTI (urinary tract infection)    Varicose veins    Ventricular tachycardia (paroxysmal) (HCC) 08/2019   30 day monitor.   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   Procedure: RIGHT TOTAL KNEE ARTHROPLASTY;  Surgeon: Ollen Gross, MD;  Location: [facility];  Service: Orthopedics;  Laterality: Right;   TOTAL KNEE ARTHROPLASTY Left 06/06/2016   Procedure: LEFT TOTAL KNEE ARTHROPLASTY;  Surgeon: Ollen Gross, MD;  Location: [facility];  Service: Orthopedics;  Laterality: Left;   XI ROBOTIC ASSISTED HIATAL HERNIA REPAIR N/A 01/03/2022   Procedure: XI ROBOTIC TAKEDOWN HIATAL HERNIA with fundoplication;  Surgeon: Luretha Murphy, MD;  Location: [facility];  Service: General;  Laterality: N/A;    Current Outpatient Medications:    methylPREDNISolone (MEDROL DOSEPAK) 4 MG TBPK tablet, 6 day dose pack - take as directed, Disp: 21 tablet, Rfl: 0   Calcium  Carbonate-Vitamin D (CALCIUM 500 + D PO), Take 1 tablet by mouth 2 (two) times daily., Disp: , Rfl:    cyclobenzaprine (FLEXERIL) 5 MG tablet, Take 1 tablet (5 mg total) by mouth at bedtime., Disp: 30 tablet, Rfl: 1   ELIQUIS 5 MG TABS tablet, TAKE 1 TABLET BY MOUTH TWICE A DAY, Disp: 180 tablet, Rfl: 1   Evolocumab (REPATHA SURECLICK) 140 MG/ML SOAJ, Inject 140 mg into the skin every 14 (fourteen) days., Disp: 6 mL, Rfl: 3   famotidine (PEPCID) 40 MG tablet, Take 40 mg by mouth daily., Disp: , Rfl:    furosemide (LASIX) 20 MG tablet, Take 1 tablet (20 mg total) by mouth daily as needed for edema., Disp: 90 tablet, Rfl: 1   loratadine (CLARITIN) 10 MG tablet, Take 10 mg by mouth at bedtime., Disp: , Rfl:    metoprolol tartrate (LOPRESSOR) 25 MG tablet, TAKE 1 TABLET BY MOUTH TWICE A DAY, Disp: 180 tablet, Rfl: 1   nystatin cream (MYCOSTATIN), APPLY TO AFFECTED AREA TWICE A DAY, Disp: , Rfl:    pantoprazole (PROTONIX) 40 MG tablet, TAKE 1 TABLET BY MOUTH EVERY DAY, Disp: 90 tablet, Rfl: 3   potassium chloride (KLOR-CON M) 10 MEQ tablet, Take 1 tablet (10 mEq total) by mouth daily. When taking furosemide, Disp: 90 tablet, Rfl: 1  Propylene Glycol (SYSTANE COMPLETE OP), Place 1 drop into both eyes daily., Disp: , Rfl:    triamcinolone cream (KENALOG) 0.1 %, APPLY THIN COAT TO AFFECTED AREA TWICE A DAY, Disp: , Rfl:    Vitamin D, Ergocalciferol, (DRISDOL) 1.25 MG (50000 UNIT) CAPS capsule, TAKE 1 CAPSULE BY MOUTH ONE TIME PER WEEK, Disp: 12 capsule, Rfl: 5   Wheat Dextrin (BENEFIBER) POWD, Take 1 Scoop by mouth at bedtime., Disp: , Rfl:   No Known Allergies Review of Systems Objective:  There were no vitals filed for this visit.  General: Well developed, nourished, in no acute distress, alert and oriented x3   Dermatological: Skin is warm, dry and supple bilateral. Nails x 10 are well maintained; remaining integument appears unremarkable at this time. There are no open sores, no preulcerative  lesions, no rash or signs of infection present.  Nails have been cut short but they do appear to be dystrophic.  Vascular: Dorsalis Pedis artery and Posterior Tibial artery pedal pulses are 2/4 bilateral with immedate capillary fill time. Pedal hair growth present. No varicosities and no lower extremity edema present bilateral.   Neruologic: Grossly intact via light touch bilateral. Vibratory intact via tuning fork bilateral. Protective threshold with Semmes Wienstein monofilament intact to all pedal sites bilateral. Patellar and Achilles deep tendon reflexes 2+ bilateral. No Babinski or clonus noted bilateral.   Musculoskeletal: No gross boney pedal deformities bilateral. No pain, crepitus, or limitation noted with foot and ankle range of motion bilateral. Muscular strength 5/5 in all groups tested bilateral.  Pain on palpation medial calcaneal tubercle of the bilateral heels.  She also has pain on palpation of the tibialis anterior tendon with fluctuance at its insertion posterior tibial tendon flexion at its insertion.  She also has osteoarthritic changes to the forefoot.  Gait: Unassisted, Nonantalgic.    Radiographs:  Radiographs taken today demonstrate an osseously mature individual with demineralized bone throughout.  Significant osteoarthritic changes midfoot right greater than left.  Starting at the midtarsal joint including the tarsometatarsal joints.  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  Nail dystrophy bilateral.  Plan: Injected the bilateral heels today 20 mg Kenalog milligrams Marcaine point of maximal tenderness.  Started her on methylprednisolone.  Will not give her NSAIDs because of her kidney and liver disease.  Will follow-up with her in 1 month see how she is doing also to take samples of the nails.     Kadin Canipe T.  [place], [place]

## 2023-06-01 ENCOUNTER — Encounter: Payer: Self-pay | Admitting: Physician Assistant

## 2023-06-01 ENCOUNTER — Ambulatory Visit: Payer: PPO | Admitting: Physician Assistant

## 2023-06-01 VITALS — BP 112/66 | HR 65 | Temp 96.8°F | Ht 61.0 in | Wt 200.6 lb

## 2023-06-01 DIAGNOSIS — E559 Vitamin D deficiency, unspecified: Secondary | ICD-10-CM | POA: Diagnosis not present

## 2023-06-01 DIAGNOSIS — E782 Mixed hyperlipidemia: Secondary | ICD-10-CM

## 2023-06-01 DIAGNOSIS — M545 Low back pain, unspecified: Secondary | ICD-10-CM

## 2023-06-01 DIAGNOSIS — R7303 Prediabetes: Secondary | ICD-10-CM

## 2023-06-01 DIAGNOSIS — Z23 Encounter for immunization: Secondary | ICD-10-CM

## 2023-06-01 DIAGNOSIS — I480 Paroxysmal atrial fibrillation: Secondary | ICD-10-CM

## 2023-06-01 DIAGNOSIS — G8929 Other chronic pain: Secondary | ICD-10-CM | POA: Diagnosis not present

## 2023-06-01 NOTE — Progress Notes (Signed)
Established Patient Office Visit  Subjective:  Patient ID: Tonya Graves, female    DOB: 1951-01-08  Age: 72 y.o. MRN: [id number]  CC:  Chief Complaint  Patient presents with   Medical Management of Chronic Issues    HPI [name] presents for chronic follow up  Pt with history of hyperlipidemia.  Pt had episode with elevated liver enzymes so stopped oral statin treatment - she is now on Repatha - due to recheck lipid panel  Pt with history of GERD - stable on Protonix 40mg  qd  Pt with history of vit D def - taking weekly supplement - due to recheck lab  Pt with history of PAF - currently follows with cardiology and stable on Eliquis 5mg  qd She recently had appt with Dr Lalla Brothers   Pt states she has had issues with low back pain and has a history of scolisis- in the near future would like referral to specialist but will call back when she is ready for appt  Pt would like flu vaccine today  Past Medical History:  Diagnosis Date   Arthritis    OA   Atrial fibrillation (HCC)    Bronchitis    Cataracts, bilateral    Dysrhythmia    history Vtac   GERD (gastroesophageal reflux disease)    Heart palpitations 2016 august   High cholesterol    History of hiatal hernia    Osteopenia    Prediabetes    Stroke [facility])    Urine frequency    UTI (urinary tract infection)    Varicose veins    Ventricular tachycardia (paroxysmal) (HCC) 08/2019   30 day monitor.    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   Procedure: RIGHT TOTAL KNEE ARTHROPLASTY;  Surgeon: Ollen Gross, MD;  Location: [facility];  Service: Orthopedics;  Laterality: Right;   TOTAL KNEE ARTHROPLASTY Left 06/06/2016   Procedure: LEFT TOTAL KNEE ARTHROPLASTY;  Surgeon: Ollen Gross, MD;  Location: [facility];  Service: Orthopedics;  Laterality: Left;   XI ROBOTIC  ASSISTED HIATAL HERNIA REPAIR N/A 01/03/2022   Procedure: XI ROBOTIC TAKEDOWN HIATAL HERNIA with fundoplication;  Surgeon: Luretha Murphy, MD;  Location: [facility];  Service: General;  Laterality: N/A;    Family History  Problem Relation Age of Onset   High blood pressure Mother    Atrial fibrillation Mother    Stroke Father    Heart disease Father    High Cholesterol Father    Atrial fibrillation Sister    Cancer Maternal Grandmother    Leukemia Maternal Grandmother    Other Maternal Grandfather        [place]y [place] spotted fever   Stroke Paternal Grandmother     Social History   Socioeconomic History   Marital status: Married    Spouse name: Not on file   Number of children: Not on file   Years of education: Not on file   Highest education level: Not on file  Occupational History   Not on file  Tobacco Use   Smoking status: Never   Smokeless tobacco: Never  Vaping Use   Vaping status: Never Used  Substance and Sexual Activity   Alcohol use: No   Drug use: No   Sexual activity: Not on file  Other Topics  Concern   Not on file  Social History Narrative   Right handed   Caffeine 1 every week   One story home with husband   Social Determinants of Health   Financial Resource Strain: Low Risk  (04/11/2023)   Overall Financial Resource Strain (CARDIA)    Difficulty of Paying Living Expenses: Not hard at all  Food Insecurity: No Food Insecurity (04/11/2023)   Hunger Vital Sign    Worried About Running Out of Food in the Last Year: Never true    Ran Out of Food in the Last Year: Never true  Transportation Needs: No Transportation Needs (04/11/2023)   PRAPARE - Administrator, Civil Service (Medical): No    Lack of Transportation (Non-Medical): No  Physical Activity: Inactive (04/11/2023)   Exercise Vital Sign    Days of Exercise per Week: 0 days    Minutes of Exercise per Session: 0 min  Stress: No Stress Concern Present (04/11/2023)   [employer] of  Occupational Health - Occupational Stress Questionnaire    Feeling of Stress : Not at all  Social Connections: Socially Integrated (04/11/2023)   Social Connection and Isolation Panel [NHANES]    Frequency of Communication with Friends and Family: More than three times a week    Frequency of Social Gatherings with Friends and Family: More than three times a week    Attends Religious Services: More than 4 times per year    Active Member of [employer] or Organizations: Yes    Attends Engineer, structural: More than 4 times per year    Marital Status: Married  Catering manager Violence: Not At Risk (04/11/2023)   Humiliation, Afraid, Rape, and Kick questionnaire    Fear of Current or Ex-Partner: No    Emotionally Abused: No    Physically Abused: No    Sexually Abused: No     Current Outpatient Medications:    Calcium Carbonate-Vitamin D (CALCIUM 500 + D PO), Take 1 tablet by mouth 2 (two) times daily., Disp: , Rfl:    cyclobenzaprine (FLEXERIL) 5 MG tablet, Take 1 tablet (5 mg total) by mouth at bedtime., Disp: 30 tablet, Rfl: 1   ELIQUIS 5 MG TABS tablet, TAKE 1 TABLET BY MOUTH TWICE A DAY, Disp: 180 tablet, Rfl: 1   Evolocumab (REPATHA SURECLICK) 140 MG/ML SOAJ, Inject 140 mg into the skin every 14 (fourteen) days., Disp: 6 mL, Rfl: 3   famotidine (PEPCID) 40 MG tablet, Take 40 mg by mouth daily., Disp: , Rfl:    furosemide (LASIX) 20 MG tablet, Take 1 tablet (20 mg total) by mouth daily as needed for edema., Disp: 90 tablet, Rfl: 1   loratadine (CLARITIN) 10 MG tablet, Take 10 mg by mouth at bedtime., Disp: , Rfl:    metoprolol tartrate (LOPRESSOR) 25 MG tablet, TAKE 1 TABLET BY MOUTH TWICE A DAY, Disp: 180 tablet, Rfl: 1   nystatin cream (MYCOSTATIN), APPLY TO AFFECTED AREA TWICE A DAY, Disp: , Rfl:    pantoprazole (PROTONIX) 40 MG tablet, TAKE 1 TABLET BY MOUTH EVERY DAY, Disp: 90 tablet, Rfl: 3   potassium chloride (KLOR-CON M) 10 MEQ tablet, Take 1 tablet (10 mEq total) by mouth  daily. When taking furosemide, Disp: 90 tablet, Rfl: 1   Propylene Glycol (SYSTANE COMPLETE OP), Place 1 drop into both eyes daily., Disp: , Rfl:    triamcinolone cream (KENALOG) 0.1 %, APPLY THIN COAT TO AFFECTED AREA TWICE A DAY, Disp: , Rfl:    Vitamin  D, Ergocalciferol, (DRISDOL) 1.25 MG (50000 UNIT) CAPS capsule, TAKE 1 CAPSULE BY MOUTH ONE TIME PER WEEK, Disp: 12 capsule, Rfl: 5   Wheat Dextrin (BENEFIBER) POWD, Take 1 Scoop by mouth at bedtime., Disp: , Rfl:    No Known Allergies  CONSTITUTIONAL: Negative for chills, fatigue, fever, unintentional weight gain and unintentional weight loss.  E/N/T: Negative for ear pain, nasal congestion and sore throat.  CARDIOVASCULAR: Negative for chest pain, dizziness, palpitations and pedal edema.  RESPIRATORY: Negative for recent cough and dyspnea.  GASTROINTESTINAL: Negative for abdominal pain, acid reflux symptoms, constipation, diarrhea, nausea and vomiting.  MSK: see HPI INTEGUMENTARY: Negative for rash.  NEUROLOGICAL: Negative for dizziness and headaches.  PSYCHIATRIC: Negative for sleep disturbance and to question depression screen.  Negative for depression, negative for anhedonia.        Objective:  PHYSICAL EXAM:   VS: BP 112/66 (BP Location: Left Arm, Patient Position: Sitting, Cuff Size: Large)   Pulse 65   Temp (!) 96.8 F (36 C) (Temporal)   Ht 5\' 1"  (1.549 m)   Wt 200 lb 9.6 oz (91 kg)   SpO2 99%   BMI 37.90 kg/m   GEN: Well nourished, well developed, in no acute distress  Cardiac: RRR; no murmurs, rubs, or gallops,no edema -  Respiratory:  normal respiratory rate and pattern with no distress - normal breath sounds with no rales, rhonchi, wheezes or rubs  MS: no deformity or atrophy  Skin: warm and dry, no rash   Psych: euthymic mood, appropriate affect and demeanor   Health Maintenance Due  Topic Date Due   Zoster Vaccines- Shingrix (1 of 2) Never done   MAMMOGRAM  11/23/2022   INFLUENZA VACCINE  04/06/2023     There are no preventive care reminders to display for this patient.  Lab Results  Component Value Date   TSH 2.430 11/22/2022   Lab Results  Component Value Date   WBC 4.4 11/22/2022   HGB 12.2 11/22/2022   HCT 36.5 11/22/2022   MCV 84 11/22/2022   PLT 228 11/22/2022   Lab Results  Component Value Date   NA 141 11/22/2022   K 4.7 11/22/2022   CO2 23 11/22/2022   GLUCOSE 100 (H) 11/22/2022   BUN 22 11/22/2022   CREATININE 0.84 11/22/2022   BILITOT 0.3 11/22/2022   ALKPHOS 80 11/22/2022   AST 17 11/22/2022   ALT 12 11/22/2022   PROT 6.6 11/22/2022   ALBUMIN 4.1 11/22/2022   CALCIUM 9.3 11/22/2022   ANIONGAP 5 01/05/2022   EGFR 75 11/22/2022   Lab Results  Component Value Date   CHOL 234 (H) 10/25/2022   Lab Results  Component Value Date   HDL 102 10/25/2022   Lab Results  Component Value Date   LDLCALC 120 (H) 10/25/2022   Lab Results  Component Value Date   TRIG 71 10/25/2022   Lab Results  Component Value Date   CHOLHDL 2.3 10/25/2022   Lab Results  Component Value Date   HGBA1C 5.8 (H) 11/22/2022      Assessment & Plan:   Problem List Items Addressed This Visit       Cardiovascular and Mediastinum   PAF (paroxysmal atrial fibrillation) (HCC)   Relevant Orders   CBC with Differential/Platelet   Comprehensive metabolic panel   TSH Continue med Follow up with cardiology     Other   Mixed hyperlipidemia - Primary   Relevant Orders   Comprehensive metabolic panel Lipid panel pending Continue  Follow up with Dr Allyson Sabal   Vitamin D insufficiency   Relevant Orders   VITAMIN D 25 Hydroxy (Vit-D Deficiency, Fractures)   Other Visit Diagnoses     Prediabetes       Relevant Orders   Hemoglobin A1c Watch diet   Encounter for immunization       Trivalent fluad given      Low back pain Pt to call back when she would like referral done to specialist at her request      GERD Continue protonix         No orders of the defined  types were placed in this encounter.   Follow-up: Return in about 6 months (around 11/29/2023) for chronic fasting follow-up.    SARA R Klare Criss, PA-C

## 2023-06-02 LAB — COMPREHENSIVE METABOLIC PANEL
ALT: 12 [IU]/L (ref 0–32)
AST: 17 [IU]/L (ref 0–40)
Albumin: 3.7 g/dL — ABNORMAL LOW (ref 3.8–4.8)
Alkaline Phosphatase: 60 [IU]/L (ref 44–121)
BUN/Creatinine Ratio: 23 (ref 12–28)
BUN: 23 mg/dL (ref 8–27)
Bilirubin Total: 0.2 mg/dL (ref 0.0–1.2)
CO2: 24 mmol/L (ref 20–29)
Calcium: 9.2 mg/dL (ref 8.7–10.3)
Chloride: 105 mmol/L (ref 96–106)
Creatinine, Ser: 0.99 mg/dL (ref 0.57–1.00)
Globulin, Total: 2.7 g/dL (ref 1.5–4.5)
Glucose: 100 mg/dL — ABNORMAL HIGH (ref 70–99)
Potassium: 4.9 mmol/L (ref 3.5–5.2)
Sodium: 139 mmol/L (ref 134–144)
Total Protein: 6.4 g/dL (ref 6.0–8.5)
eGFR: 61 mL/min/{1.73_m2} (ref >59)

## 2023-06-02 LAB — CBC WITH DIFFERENTIAL/PLATELET
Basophils Absolute: 0 10*3/uL (ref 0.0–0.2)
Basos: 0 %
EOS (ABSOLUTE): 0 10*3/uL (ref 0.0–0.4)
Eos: 1 %
Hematocrit: 36.8 % (ref 34.0–46.6)
Hemoglobin: 11.7 g/dL (ref 11.1–15.9)
Immature Grans (Abs): 0 10*3/uL (ref 0.0–0.1)
Immature Granulocytes: 0 %
Lymphocytes Absolute: 1.6 10*3/uL (ref 0.7–3.1)
Lymphs: 31 %
MCH: 27.7 pg (ref 26.6–33.0)
MCHC: 31.8 g/dL (ref 31.5–35.7)
MCV: 87 fL (ref 79–97)
Monocytes Absolute: 0.5 10*3/uL (ref 0.1–0.9)
Monocytes: 10 %
Neutrophils Absolute: 2.9 10*3/uL (ref 1.4–7.0)
Neutrophils: 58 %
Platelets: 256 10*3/uL (ref 150–450)
RBC: 4.22 x10E6/uL (ref 3.77–5.28)
RDW: 13.9 % (ref 11.7–15.4)
WBC: 5 10*3/uL (ref 3.4–10.8)

## 2023-06-02 LAB — LIPID PANEL
Chol/HDL Ratio: 1.7 {ratio} (ref 0.0–4.4)
Cholesterol, Total: 182 mg/dL (ref 100–199)
HDL: 108 mg/dL (ref >39)
LDL Chol Calc (NIH): 64 mg/dL (ref 0–99)
Triglycerides: 49 mg/dL (ref 0–149)
VLDL Cholesterol Cal: 10 mg/dL (ref 5–40)

## 2023-06-02 LAB — TSH: TSH: 1.72 u[IU]/mL (ref 0.450–4.500)

## 2023-06-02 LAB — HEMOGLOBIN A1C
Est. average glucose Bld gHb Est-mCnc: 126 mg/dL
Hgb A1c MFr Bld: 6 % — ABNORMAL HIGH (ref 4.8–5.6)

## 2023-06-02 LAB — VITAMIN D 25 HYDROXY (VIT D DEFICIENCY, FRACTURES): Vit D, 25-Hydroxy: 84.5 ng/mL (ref 30.0–100.0)

## 2023-06-04 DIAGNOSIS — S61216A Laceration without foreign body of right little finger without damage to nail, initial encounter: Secondary | ICD-10-CM | POA: Diagnosis not present

## 2023-06-05 NOTE — Progress Notes (Unsigned)
Acute Office Visit  Subjective:    Patient ID: Tonya Graves, female    DOB: 11/12/1950, 72 y.o.   MRN: [id number]  Chief Complaint  Patient presents with   Laceration    HPI: Patient is in today for laceration on her right lateral 5th digit. Patient states she cut it at home while cooking chili on the lid of a can. Was seen at [facility] on Sunday, was given Tdap and they glued her finger. Patient thinks her finger may be infected. States her finger is sore. Patient took Tylenol the first night and it didn't help at all, but last night it helped the pain. Patient denies fever, purulent drainage or inability to move the finger.   Past Medical History:  Diagnosis Date   Arthritis    OA   Atrial fibrillation (HCC)    Bronchitis    Cataracts, bilateral    Dysrhythmia    history Vtac   GERD (gastroesophageal reflux disease)    Heart palpitations 2016 august   High cholesterol    History of hiatal hernia    Osteopenia    Prediabetes    Stroke [facility])    Urine frequency    UTI (urinary tract infection)    Varicose veins    Ventricular tachycardia (paroxysmal) (HCC) 08/2019   30 day monitor.    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   Procedure: RIGHT TOTAL KNEE ARTHROPLASTY;  Surgeon: Ollen Gross, MD;  Location: [facility];  Service: Orthopedics;  Laterality: Right;   TOTAL KNEE ARTHROPLASTY Left 06/06/2016   Procedure: LEFT TOTAL KNEE ARTHROPLASTY;  Surgeon: Ollen Gross, MD;  Location: [facility];  Service: Orthopedics;  Laterality: Left;   XI ROBOTIC ASSISTED HIATAL HERNIA REPAIR N/A 01/03/2022   Procedure: XI ROBOTIC TAKEDOWN HIATAL HERNIA with fundoplication;  Surgeon: Luretha Murphy, MD;  Location: [facility];  Service: General;  Laterality: N/A;    Family History  Problem Relation Age of Onset   High blood pressure Mother    Atrial  fibrillation Mother    Stroke Father    Heart disease Father    High Cholesterol Father    Atrial fibrillation Sister    Cancer Maternal Grandmother    Leukemia Maternal Grandmother    Other Maternal Grandfather        rocky [place] spotted fever   Stroke Paternal Grandmother     Social History   Socioeconomic History   Marital status: Married    Spouse name: Not on file   Number of children: Not on file   Years of education: Not on file   Highest education level: Not on file  Occupational History   Not on file  Tobacco Use   Smoking status: Never   Smokeless tobacco: Never  Vaping Use   Vaping status: Never Used  Substance and Sexual Activity   Alcohol use: No   Drug use: No   Sexual activity: Not on file  Other Topics Concern   Not on file  Social History Narrative   Right handed   Caffeine 1 every week   One story home with husband   Social Determinants of Health   Financial Resource Strain: Low Risk  (04/11/2023)   Overall Financial Resource Strain (CARDIA)    Difficulty of Paying Living  Expenses: Not hard at all  Food Insecurity: No Food Insecurity (04/11/2023)   Hunger Vital Sign    Worried About Running Out of Food in the Last Year: Never true    Ran Out of Food in the Last Year: Never true  Transportation Needs: No Transportation Needs (04/11/2023)   PRAPARE - Administrator, Civil Service (Medical): No    Lack of Transportation (Non-Medical): No  Physical Activity: Inactive (04/11/2023)   Exercise Vital Sign    Days of Exercise per Week: 0 days    Minutes of Exercise per Session: 0 min  Stress: No Stress Concern Present (04/11/2023)   [employer] of Occupational Health - Occupational Stress Questionnaire    Feeling of Stress : Not at all  Social Connections: Socially Integrated (04/11/2023)   Social Connection and Isolation Panel [NHANES]    Frequency of Communication with Friends and Family: More than three times a week    Frequency of Social  Gatherings with Friends and Family: More than three times a week    Attends Religious Services: More than 4 times per year    Active Member of [employer] or Organizations: Yes    Attends Engineer, structural: More than 4 times per year    Marital Status: Married  Catering manager Violence: Not At Risk (04/11/2023)   Humiliation, Afraid, Rape, and Kick questionnaire    Fear of Current or Ex-Partner: No    Emotionally Abused: No    Physically Abused: No    Sexually Abused: No    Outpatient Medications Prior to Visit  Medication Sig Dispense Refill   Calcium Carbonate-Vitamin D (CALCIUM 500 + D PO) Take 1 tablet by mouth 2 (two) times daily.     cyclobenzaprine (FLEXERIL) 5 MG tablet Take 1 tablet (5 mg total) by mouth at bedtime. 30 tablet 1   ELIQUIS 5 MG TABS tablet TAKE 1 TABLET BY MOUTH TWICE A DAY 180 tablet 1   Evolocumab (REPATHA SURECLICK) 140 MG/ML SOAJ Inject 140 mg into the skin every 14 (fourteen) days. 6 mL 3   famotidine (PEPCID) 40 MG tablet Take 40 mg by mouth daily.     furosemide (LASIX) 20 MG tablet Take 1 tablet (20 mg total) by mouth daily as needed for edema. 90 tablet 1   loratadine (CLARITIN) 10 MG tablet Take 10 mg by mouth at bedtime.     metoprolol tartrate (LOPRESSOR) 25 MG tablet TAKE 1 TABLET BY MOUTH TWICE A DAY 180 tablet 1   nystatin cream (MYCOSTATIN) APPLY TO AFFECTED AREA TWICE A DAY     pantoprazole (PROTONIX) 40 MG tablet TAKE 1 TABLET BY MOUTH EVERY DAY 90 tablet 3   potassium chloride (KLOR-CON M) 10 MEQ tablet Take 1 tablet (10 mEq total) by mouth daily. When taking furosemide 90 tablet 1   Propylene Glycol (SYSTANE COMPLETE OP) Place 1 drop into both eyes daily.     triamcinolone cream (KENALOG) 0.1 % APPLY THIN COAT TO AFFECTED AREA TWICE A DAY     Vitamin D, Ergocalciferol, (DRISDOL) 1.25 MG (50000 UNIT) CAPS capsule TAKE 1 CAPSULE BY MOUTH ONE TIME PER WEEK 12 capsule 5   Wheat Dextrin (BENEFIBER) POWD Take 1 Scoop by mouth at bedtime.      No facility-administered medications prior to visit.    No Known Allergies  Review of Systems  Constitutional:  Negative for chills, fatigue and fever.  Respiratory:  Negative for shortness of breath.   Cardiovascular:  Negative for  chest pain.  Skin:  Positive for wound (right little finger).       Objective:        06/06/2023    8:53 AM 06/01/2023    8:55 AM 04/11/2023    3:34 PM  Vitals with BMI  Height 5\' 1"  5\' 1"  5\' 1"   Weight 203 lbs 200 lbs 10 oz 205 lbs  BMI 38.38 37.92 38.75  Systolic 124 112 161  Diastolic 68 66 64  Pulse 62 65 51    No data found.   Physical Exam Constitutional:      General: She is not in acute distress.    Appearance: Normal appearance. She is not ill-appearing.  Cardiovascular:     Rate and Rhythm: Normal rate and regular rhythm.     Heart sounds: Normal heart sounds.  Pulmonary:     Effort: Pulmonary effort is normal.     Breath sounds: Normal breath sounds. No wheezing.  Musculoskeletal:     Right hand: Laceration (right little finger) present.  Skin:    Capillary Refill: Capillary refill takes less than 2 seconds.     Findings: Erythema present.  Neurological:     Mental Status: She is alert. Mental status is at baseline.  Psychiatric:        Mood and Affect: Mood normal.         Lab Results  Component Value Date   TSH 1.720 06/01/2023   Lab Results  Component Value Date   WBC 5.0 06/01/2023   HGB 11.7 06/01/2023   HCT 36.8 06/01/2023   MCV 87 06/01/2023   PLT 256 06/01/2023   Lab Results  Component Value Date   NA 139 06/01/2023   K 4.9 06/01/2023   CO2 24 06/01/2023   GLUCOSE 100 (H) 06/01/2023   BUN 23 06/01/2023   CREATININE 0.99 06/01/2023   BILITOT 0.2 06/01/2023   ALKPHOS 60 06/01/2023   AST 17 06/01/2023   ALT 12 06/01/2023   PROT 6.4 06/01/2023   ALBUMIN 3.7 (L) 06/01/2023   CALCIUM 9.2 06/01/2023   ANIONGAP 5 01/05/2022   EGFR 61 06/01/2023   Lab Results  Component Value Date   CHOL  182 06/01/2023   Lab Results  Component Value Date   HDL 108 06/01/2023   Lab Results  Component Value Date   LDLCALC 64 06/01/2023   Lab Results  Component Value Date   TRIG 49 06/01/2023   Lab Results  Component Value Date   CHOLHDL 1.7 06/01/2023   Lab Results  Component Value Date   HGBA1C 6.0 (H) 06/01/2023       Assessment & Plan:  Laceration of right little finger without foreign body without damage to nail, subsequent encounter Assessment & Plan: Acute Keflex 500 mg by mouth TWICE A DAY for 7 days Continue to follow Skin Glue instructions You may take a shower or a bath, but try to keep the wound dry. Do not soak the wound in water. After you take a shower or a bath, pat the wound dry with a clean towel. Do not rub the wound. Do not do any activities that will make you sweat a lot until the skin glue has fallen off. Do not apply liquid, cream, or ointment medicine to your wound while the skin glue is still on. If a bandage is placed over the wound, do not put tape right on top of the skin glue. Do not pick at the glue. The skin glue usually stays on  for 5-10 days. Then, it falls off the skin.  Orders: -     Cephalexin; Take 1 capsule (500 mg total) by mouth 2 (two) times daily for 7 days.  Dispense: 14 capsule; Refill: 0     Meds ordered this encounter  Medications   cephALEXin (KEFLEX) 500 MG capsule    Sig: Take 1 capsule (500 mg total) by mouth 2 (two) times daily for 7 days.    Dispense:  14 capsule    Refill:  0    No orders of the defined types were placed in this encounter.    Follow-up: Return in about 1 week (around 06/13/2023), or if symptoms worsen or fail to improve.  An After Visit Summary was printed and given to the patient.  Total time spent on today's visit was greater than 30 minutes, including both face-to-face time and nonface-to-face time personally spent on review of chart (labs and imaging), discussing labs and goals, discussing  further work-up, treatment options, referrals to specialist if needed, reviewing outside records if pertinent, answering patient's questions, and coordinating care.   Lajuana Matte, FNP [facility] [phone]

## 2023-06-06 ENCOUNTER — Encounter: Payer: Self-pay | Admitting: Family Medicine

## 2023-06-06 ENCOUNTER — Ambulatory Visit: Payer: PPO | Admitting: Family Medicine

## 2023-06-06 VITALS — BP 124/68 | HR 62 | Temp 96.7°F | Ht 61.0 in | Wt 203.0 lb

## 2023-06-06 DIAGNOSIS — S61216D Laceration without foreign body of right little finger without damage to nail, subsequent encounter: Secondary | ICD-10-CM

## 2023-06-06 DIAGNOSIS — S61216A Laceration without foreign body of right little finger without damage to nail, initial encounter: Secondary | ICD-10-CM | POA: Insufficient documentation

## 2023-06-06 LAB — CUP PACEART REMOTE DEVICE CHECK
Date Time Interrogation Session: 20240930231829
Implantable Pulse Generator Implant Date: 20220214

## 2023-06-06 MED ORDER — CEPHALEXIN 500 MG PO CAPS: 500.0000 mg | ORAL_CAPSULE | Freq: Two times a day (BID) | ORAL | 0 refills | Status: AC

## 2023-06-06 NOTE — Assessment & Plan Note (Addendum)
Acute Keflex 500 mg by mouth TWICE A DAY for 7 days Continue to follow Skin Glue instructions You may take a shower or a bath, but try to keep the wound dry. Do not soak the wound in water. After you take a shower or a bath, pat the wound dry with a clean towel. Do not rub the wound. Do not do any activities that will make you sweat a lot until the skin glue has fallen off. Do not apply liquid, cream, or ointment medicine to your wound while the skin glue is still on. If a bandage is placed over the wound, do not put tape right on top of the skin glue. Do not pick at the glue. The skin glue usually stays on for 5-10 days. Then, it falls off the skin.

## 2023-06-09 ENCOUNTER — Encounter: Payer: Self-pay | Admitting: Family Medicine

## 2023-06-09 ENCOUNTER — Ambulatory Visit (INDEPENDENT_AMBULATORY_CARE_PROVIDER_SITE_OTHER): Payer: PPO | Admitting: Family Medicine

## 2023-06-09 VITALS — BP 128/74 | HR 60 | Temp 97.2°F | Ht 61.0 in | Wt 203.0 lb

## 2023-06-09 DIAGNOSIS — S61216D Laceration without foreign body of right little finger without damage to nail, subsequent encounter: Secondary | ICD-10-CM | POA: Diagnosis not present

## 2023-06-09 NOTE — Progress Notes (Signed)
Acute Office Visit  Subjective:    Patient ID: Tonya Graves, female    DOB: 1951-07-09, 72 y.o.   MRN: [id number]  Chief Complaint  Patient presents with   Laceration    HPI: Patient is in today for recheck of laceration on right lateral 5th finger (pinky). Patient was concerned when her glove leaked and water caused the glue on her finger to come off. States her finger is not sore, redness and swelling have gone down tremendously. Patient denies fever, purulent drainage or increased redness. She said it is still tender but that overall it is much better.   Past Medical History:  Diagnosis Date   Arthritis    OA   Atrial fibrillation (HCC)    Bronchitis    Cataracts, bilateral    Dysrhythmia    history Vtac   GERD (gastroesophageal reflux disease)    Heart palpitations 2016 august   High cholesterol    History of hiatal hernia    Osteopenia    Prediabetes    Stroke [facility])    Urine frequency    UTI (urinary tract infection)    Varicose veins    Ventricular tachycardia (paroxysmal) (HCC) 08/2019   30 day monitor.    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   Procedure: RIGHT TOTAL KNEE ARTHROPLASTY;  Surgeon: Ollen Gross, MD;  Location: [facility];  Service: Orthopedics;  Laterality: Right;   TOTAL KNEE ARTHROPLASTY Left 06/06/2016   Procedure: LEFT TOTAL KNEE ARTHROPLASTY;  Surgeon: Ollen Gross, MD;  Location: [facility];  Service: Orthopedics;  Laterality: Left;   XI ROBOTIC ASSISTED HIATAL HERNIA REPAIR N/A 01/03/2022   Procedure: XI ROBOTIC TAKEDOWN HIATAL HERNIA with fundoplication;  Surgeon: Luretha Murphy, MD;  Location: [facility];  Service: General;  Laterality: N/A;    Family History  Problem Relation Age of Onset   High blood pressure Mother    Atrial fibrillation Mother    Stroke Father    Heart disease Father     High Cholesterol Father    Atrial fibrillation Sister    Cancer Maternal Grandmother    Leukemia Maternal Grandmother    Other Maternal Grandfather        rocky [place] spotted fever   Stroke Paternal Grandmother     Social History   Socioeconomic History   Marital status: Married    Spouse name: Not on file   Number of children: Not on file   Years of education: Not on file   Highest education level: Not on file  Occupational History   Not on file  Tobacco Use   Smoking status: Never   Smokeless tobacco: Never  Vaping Use   Vaping status: Never Used  Substance and Sexual Activity   Alcohol use: No   Drug use: No   Sexual activity: Not on file  Other Topics Concern   Not on file  Social History Narrative   Right handed   Caffeine 1 every week   One story home with husband   Social Determinants of Health   Financial Resource Strain: Low Risk  (04/11/2023)   Overall Financial Resource Strain (CARDIA)    Difficulty of Paying Living Expenses: Not hard at all  Food Insecurity: No Food Insecurity (04/11/2023)   Hunger Vital Sign  Worried About Programme researcher, broadcasting/film/video in the Last Year: Never true    Ran Out of Food in the Last Year: Never true  Transportation Needs: No Transportation Needs (04/11/2023)   PRAPARE - Administrator, Civil Service (Medical): No    Lack of Transportation (Non-Medical): No  Physical Activity: Inactive (04/11/2023)   Exercise Vital Sign    Days of Exercise per Week: 0 days    Minutes of Exercise per Session: 0 min  Stress: No Stress Concern Present (04/11/2023)   [employer] of Occupational Health - Occupational Stress Questionnaire    Feeling of Stress : Not at all  Social Connections: Socially Integrated (04/11/2023)   Social Connection and Isolation Panel [NHANES]    Frequency of Communication with Friends and Family: More than three times a week    Frequency of Social Gatherings with Friends and Family: More than three times a week     Attends Religious Services: More than 4 times per year    Active Member of [employer] or Organizations: Yes    Attends Engineer, structural: More than 4 times per year    Marital Status: Married  Catering manager Violence: Not At Risk (04/11/2023)   Humiliation, Afraid, Rape, and Kick questionnaire    Fear of Current or Ex-Partner: No    Emotionally Abused: No    Physically Abused: No    Sexually Abused: No    Outpatient Medications Prior to Visit  Medication Sig Dispense Refill   Calcium Carbonate-Vitamin D (CALCIUM 500 + D PO) Take 1 tablet by mouth 2 (two) times daily.     cephALEXin (KEFLEX) 500 MG capsule Take 1 capsule (500 mg total) by mouth 2 (two) times daily for 7 days. 14 capsule 0   cyclobenzaprine (FLEXERIL) 5 MG tablet Take 1 tablet (5 mg total) by mouth at bedtime. 30 tablet 1   ELIQUIS 5 MG TABS tablet TAKE 1 TABLET BY MOUTH TWICE A DAY 180 tablet 1   Evolocumab (REPATHA SURECLICK) 140 MG/ML SOAJ Inject 140 mg into the skin every 14 (fourteen) days. 6 mL 3   famotidine (PEPCID) 40 MG tablet Take 40 mg by mouth daily.     furosemide (LASIX) 20 MG tablet Take 1 tablet (20 mg total) by mouth daily as needed for edema. 90 tablet 1   loratadine (CLARITIN) 10 MG tablet Take 10 mg by mouth at bedtime.     metoprolol tartrate (LOPRESSOR) 25 MG tablet TAKE 1 TABLET BY MOUTH TWICE A DAY 180 tablet 1   nystatin cream (MYCOSTATIN) APPLY TO AFFECTED AREA TWICE A DAY     pantoprazole (PROTONIX) 40 MG tablet TAKE 1 TABLET BY MOUTH EVERY DAY 90 tablet 3   potassium chloride (KLOR-CON M) 10 MEQ tablet Take 1 tablet (10 mEq total) by mouth daily. When taking furosemide 90 tablet 1   Propylene Glycol (SYSTANE COMPLETE OP) Place 1 drop into both eyes daily.     triamcinolone cream (KENALOG) 0.1 % APPLY THIN COAT TO AFFECTED AREA TWICE A DAY     Vitamin D, Ergocalciferol, (DRISDOL) 1.25 MG (50000 UNIT) CAPS capsule TAKE 1 CAPSULE BY MOUTH ONE TIME PER WEEK 12 capsule 5   Wheat Dextrin  (BENEFIBER) POWD Take 1 Scoop by mouth at bedtime.     No facility-administered medications prior to visit.    No Known Allergies  Review of Systems  Constitutional:  Negative for fever.  HENT:  Negative for congestion and sinus pain.   Respiratory:  Negative for cough and shortness of breath.   Cardiovascular:  Negative for chest pain.  Gastrointestinal:  Negative for abdominal pain, constipation, nausea and vomiting.  Musculoskeletal:  Negative for arthralgias.  Skin:  Positive for wound.  Neurological:  Negative for weakness and numbness.  Psychiatric/Behavioral:  The patient is not nervous/anxious.        Objective:        06/09/2023   10:17 AM 06/06/2023    8:53 AM 06/01/2023    8:55 AM  Vitals with BMI  Height 5\' 1"  5\' 1"  5\' 1"   Weight 203 lbs 203 lbs 200 lbs 10 oz  BMI 38.38 38.38 37.92  Systolic 128 124 829  Diastolic 74 68 66  Pulse 60 62 65    No data found.   Physical Exam Constitutional:      General: She is not in acute distress.    Appearance: Normal appearance. She is obese. She is not ill-appearing.  Cardiovascular:     Rate and Rhythm: Normal rate and regular rhythm.     Heart sounds: Normal heart sounds.  Pulmonary:     Effort: Pulmonary effort is normal.     Breath sounds: Normal breath sounds.  Skin:    Findings: Wound (right little finger) present.  Neurological:     Mental Status: She is alert. Mental status is at baseline.  Psychiatric:        Mood and Affect: Mood normal.        Behavior: Behavior normal.     Lab Results  Component Value Date   TSH 1.720 06/01/2023   Lab Results  Component Value Date   WBC 5.0 06/01/2023   HGB 11.7 06/01/2023   HCT 36.8 06/01/2023   MCV 87 06/01/2023   PLT 256 06/01/2023   Lab Results  Component Value Date   NA 139 06/01/2023   K 4.9 06/01/2023   CO2 24 06/01/2023   GLUCOSE 100 (H) 06/01/2023   BUN 23 06/01/2023   CREATININE 0.99 06/01/2023   BILITOT 0.2 06/01/2023   ALKPHOS 60  06/01/2023   AST 17 06/01/2023   ALT 12 06/01/2023   PROT 6.4 06/01/2023   ALBUMIN 3.7 (L) 06/01/2023   CALCIUM 9.2 06/01/2023   ANIONGAP 5 01/05/2022   EGFR 61 06/01/2023   Lab Results  Component Value Date   CHOL 182 06/01/2023   Lab Results  Component Value Date   HDL 108 06/01/2023   Lab Results  Component Value Date   LDLCALC 64 06/01/2023   Lab Results  Component Value Date   TRIG 49 06/01/2023   Lab Results  Component Value Date   CHOLHDL 1.7 06/01/2023   Lab Results  Component Value Date   HGBA1C 6.0 (H) 06/01/2023       Assessment & Plan:  Laceration of right little finger without foreign body without damage to nail, subsequent encounter Assessment & Plan: Improving  Continue antibiotic and finish the full course of Keflex FU for fever, increased swelling and/or redness or purulent drainage.        Follow-up: Return scheduled appointment with Gerre Pebbles, PA 11/30/23.  An After Visit Summary was printed and given to the patient.  Total time spent on today's visit was greater than 20 minutes, including both face-to-face time and nonface-to-face time personally spent on review of chart (labs and imaging), discussing labs and goals, discussing further work-up, treatment options, referrals to specialist if needed, reviewing outside records if pertinent, answering patient's questions, and coordinating care.  Lajuana Matte, FNP [facility] [phone]

## 2023-06-09 NOTE — Assessment & Plan Note (Signed)
Improving  Continue antibiotic and finish the full course of Keflex FU for fever, increased swelling and/or redness or purulent drainage.

## 2023-06-12 ENCOUNTER — Ambulatory Visit (INDEPENDENT_AMBULATORY_CARE_PROVIDER_SITE_OTHER): Payer: PPO

## 2023-06-12 DIAGNOSIS — I639 Cerebral infarction, unspecified: Secondary | ICD-10-CM | POA: Diagnosis not present

## 2023-06-13 ENCOUNTER — Ambulatory Visit: Payer: PPO | Admitting: Family Medicine

## 2023-06-14 DIAGNOSIS — L900 Lichen sclerosus et atrophicus: Secondary | ICD-10-CM | POA: Diagnosis not present

## 2023-06-14 DIAGNOSIS — N950 Postmenopausal bleeding: Secondary | ICD-10-CM | POA: Diagnosis not present

## 2023-06-15 ENCOUNTER — Ambulatory Visit: Payer: PPO | Admitting: Physician Assistant

## 2023-06-15 ENCOUNTER — Encounter: Payer: Self-pay | Admitting: Physician Assistant

## 2023-06-15 VITALS — BP 120/68 | HR 67 | Temp 97.7°F | Ht 61.0 in | Wt 205.2 lb

## 2023-06-15 DIAGNOSIS — H6121 Impacted cerumen, right ear: Secondary | ICD-10-CM

## 2023-06-15 DIAGNOSIS — H6090 Unspecified otitis externa, unspecified ear: Secondary | ICD-10-CM | POA: Insufficient documentation

## 2023-06-15 DIAGNOSIS — H60591 Other noninfective acute otitis externa, right ear: Secondary | ICD-10-CM | POA: Diagnosis not present

## 2023-06-15 MED ORDER — OFLOXACIN 0.3 % OT SOLN: 5.0000 [drp] | Freq: Every day | OTIC | 0 refills | Status: DC

## 2023-06-15 NOTE — Progress Notes (Signed)
Acute Office Visit  Subjective:    Patient ID: Tonya Graves, female    DOB: [birth year], 72 y.o.   MRN: [id number]  Chief Complaint  Patient presents with   Ear Pain    HPI: Patient is in today for right ear feeling stopped up and clogged.  Says she is having decreased hearing in right ear as well.  Denies any pain, congestion or fever   Current Outpatient Medications:    Calcium Carbonate-Vitamin D (CALCIUM 500 + D PO), Take 1 tablet by mouth 2 (two) times daily., Disp: , Rfl:    clobetasol cream (TEMOVATE) 0.05 %, SMARTSIG:1 Topical Daily, Disp: , Rfl:    cyclobenzaprine (FLEXERIL) 5 MG tablet, Take 1 tablet (5 mg total) by mouth at bedtime., Disp: 30 tablet, Rfl: 1   ELIQUIS 5 MG TABS tablet, TAKE 1 TABLET BY MOUTH TWICE A DAY, Disp: 180 tablet, Rfl: 1   Evolocumab (REPATHA SURECLICK) 140 MG/ML SOAJ, Inject 140 mg into the skin every 14 (fourteen) days., Disp: 6 mL, Rfl: 3   famotidine (PEPCID) 40 MG tablet, Take 40 mg by mouth daily., Disp: , Rfl:    furosemide (LASIX) 20 MG tablet, Take 1 tablet (20 mg total) by mouth daily as needed for edema., Disp: 90 tablet, Rfl: 1   loratadine (CLARITIN) 10 MG tablet, Take 10 mg by mouth at bedtime., Disp: , Rfl:    metoprolol tartrate (LOPRESSOR) 25 MG tablet, TAKE 1 TABLET BY MOUTH TWICE A DAY, Disp: 180 tablet, Rfl: 1   ofloxacin (FLOXIN) 0.3 % OTIC solution, Place 5 drops into the right ear daily., Disp: 5 mL, Rfl: 0   pantoprazole (PROTONIX) 40 MG tablet, TAKE 1 TABLET BY MOUTH EVERY DAY, Disp: 90 tablet, Rfl: 3   potassium chloride (KLOR-CON M) 10 MEQ tablet, Take 1 tablet (10 mEq total) by mouth daily. When taking furosemide, Disp: 90 tablet, Rfl: 1   Propylene Glycol (SYSTANE COMPLETE OP), Place 1 drop into both eyes daily., Disp: , Rfl:    Vitamin D, Ergocalciferol, (DRISDOL) 1.25 MG (50000 UNIT) CAPS capsule, TAKE 1 CAPSULE BY MOUTH ONE TIME PER WEEK, Disp: 12 capsule, Rfl: 5   Wheat Dextrin (BENEFIBER) POWD, Take 1 Scoop by  mouth at bedtime., Disp: , Rfl:   No Known Allergies  ROS CONSTITUTIONAL: Negative for chills, fatigue, fever,  ENT - see HPI CARDIOVASCULAR: Negative for chest pain, dizziness, palpitations  RESPIRATORY: Negative for recent cough and dyspnea.       Objective:    PHYSICAL EXAM:   BP 120/68 (BP Location: Left Arm, Patient Position: Sitting, Cuff Size: Large)   Pulse 67   Temp 97.7 F (36.5 C) (Temporal)   Ht 5\' 1"  (1.549 m)   Wt 205 lb 3.2 oz (93.1 kg)   SpO2 98%   BMI 38.77 kg/m    GEN: Well nourished, well developed, in no acute distress  HEENT: left TM and canal normal - right canal obscured by cerumen and after water irrigation is noted to have irritated canal with mild swelling Cardiac: RRR; no murmurs, rubs, or gallops,no edema - no significant varicosities Respiratory:  normal respiratory rate and pattern with no distress - normal breath sounds with no rales, rhonchi, wheezes or rubs      Assessment & Plan:    Impacted cerumen of right ear  Other noninfective acute otitis externa of right ear  Other orders -     Ofloxacin; Place 5 drops into the right ear daily.  Dispense: 5  mL; Refill: 0     Follow-up: Return if symptoms worsen or fail to improve.  An After Visit Summary was printed and given to the patient.  Jettie Pagan [facility] [phone]

## 2023-06-20 ENCOUNTER — Encounter: Payer: Self-pay | Admitting: Podiatry

## 2023-06-20 ENCOUNTER — Ambulatory Visit: Payer: PPO | Admitting: Podiatry

## 2023-06-20 DIAGNOSIS — M76821 Posterior tibial tendinitis, right leg: Secondary | ICD-10-CM | POA: Diagnosis not present

## 2023-06-20 DIAGNOSIS — A499 Bacterial infection, unspecified: Secondary | ICD-10-CM | POA: Diagnosis not present

## 2023-06-20 DIAGNOSIS — L608 Other nail disorders: Secondary | ICD-10-CM | POA: Diagnosis not present

## 2023-06-20 DIAGNOSIS — M76822 Posterior tibial tendinitis, left leg: Secondary | ICD-10-CM

## 2023-06-20 DIAGNOSIS — M76819 Anterior tibial syndrome, unspecified leg: Secondary | ICD-10-CM | POA: Diagnosis not present

## 2023-06-20 DIAGNOSIS — L603 Nail dystrophy: Secondary | ICD-10-CM | POA: Diagnosis not present

## 2023-06-20 NOTE — Progress Notes (Signed)
She presents today states that she is doing better as she was but she was doing great for a while she purchased a new shoes that really slow down the progression of the tendinitis from recurring.  She also states that her toenails are long and painful.  Objective: Vital signs are stable alert and oriented x 3.  Pulses are palpable.  Toenails are long incurvated thick dystrophic possibly mycotic.  She still has tenderness on palpation of the posterior tibial tendon bilaterally.  Tenderness on inversion against resistance.  Assessment: Posterior tibial tendinitis bilateral probably about 50% resolved.  Nail dystrophy bilateral  Plan: Debridement of toenails versus samples to be sent for pathologic evaluation.  Injected 10 mg of Kenalog bilateral posterior tibial tendon area making sure not to inject into the tendon itself.

## 2023-06-23 NOTE — Progress Notes (Signed)
Carelink Summary Report / Loop Recorder 

## 2023-07-11 LAB — CUP PACEART REMOTE DEVICE CHECK
Date Time Interrogation Session: 20241102230018
Implantable Pulse Generator Implant Date: 20220214

## 2023-07-17 ENCOUNTER — Other Ambulatory Visit: Payer: Self-pay | Admitting: Podiatry

## 2023-07-17 ENCOUNTER — Ambulatory Visit (INDEPENDENT_AMBULATORY_CARE_PROVIDER_SITE_OTHER): Payer: PPO

## 2023-07-17 DIAGNOSIS — I639 Cerebral infarction, unspecified: Secondary | ICD-10-CM

## 2023-07-18 ENCOUNTER — Other Ambulatory Visit: Payer: Self-pay | Admitting: Podiatry

## 2023-07-18 MED ORDER — NEOMYCIN-POLYMYXIN-HC 1 % OT SOLN: OTIC | 3 refills | Status: DC

## 2023-07-21 ENCOUNTER — Telehealth: Payer: Self-pay

## 2023-07-21 NOTE — Telephone Encounter (Signed)
Called and spoke to patient and advised. She would like to cancel her appointment on 07/25/23.  Thanks

## 2023-07-21 NOTE — Telephone Encounter (Signed)
-----   Message from Lottie Rater sent at 07/18/2023  3:08 PM EST ----- Can you call her and let her know... I'll send in Rx  Dr. Al Corpus has reviewed your fungal culture results and it shows no fungus, but it does show bacterial colonization. He recommends trying a cortisporin otic solution that will help kill that bacteria. This is a prescription that I will send into your pharmacy. Try to keep the nails cut short and filed thin. This will help the appearance of the toenail and allow the medication to penetrate the nail better. If you have an appointment already scheduled, you can cancel it. I would schedule a follow up in 3 months to check the toenails.  Any other questions just let me know! Have a great day! ----- Message ----- From: Elinor Parkinson, [place] Sent: 07/18/2023  12:14 PM EST To: [employer], PM[facility]  Negative fungus

## 2023-07-25 ENCOUNTER — Ambulatory Visit: Payer: PPO | Admitting: Podiatry

## 2023-07-27 DIAGNOSIS — R109 Unspecified abdominal pain: Secondary | ICD-10-CM | POA: Diagnosis not present

## 2023-07-27 DIAGNOSIS — K5790 Diverticulosis of intestine, part unspecified, without perforation or abscess without bleeding: Secondary | ICD-10-CM | POA: Diagnosis not present

## 2023-07-27 DIAGNOSIS — D649 Anemia, unspecified: Secondary | ICD-10-CM | POA: Diagnosis not present

## 2023-07-27 DIAGNOSIS — K219 Gastro-esophageal reflux disease without esophagitis: Secondary | ICD-10-CM | POA: Diagnosis not present

## 2023-07-27 DIAGNOSIS — R748 Abnormal levels of other serum enzymes: Secondary | ICD-10-CM | POA: Diagnosis not present

## 2023-08-09 NOTE — Progress Notes (Signed)
Carelink Summary Report / Loop Recorder 

## 2023-08-10 DIAGNOSIS — R1011 Right upper quadrant pain: Secondary | ICD-10-CM | POA: Diagnosis not present

## 2023-08-11 ENCOUNTER — Ambulatory Visit (INDEPENDENT_AMBULATORY_CARE_PROVIDER_SITE_OTHER): Payer: PPO

## 2023-08-11 DIAGNOSIS — I639 Cerebral infarction, unspecified: Secondary | ICD-10-CM

## 2023-08-11 LAB — CUP PACEART REMOTE DEVICE CHECK
Date Time Interrogation Session: 20241205230343
Implantable Pulse Generator Implant Date: 20220214

## 2023-08-13 DIAGNOSIS — R1011 Right upper quadrant pain: Secondary | ICD-10-CM | POA: Diagnosis not present

## 2023-08-21 ENCOUNTER — Ambulatory Visit: Payer: PPO

## 2023-08-22 ENCOUNTER — Other Ambulatory Visit: Payer: Self-pay | Admitting: Physician Assistant

## 2023-08-28 ENCOUNTER — Telehealth: Payer: Self-pay | Admitting: Cardiovascular Disease

## 2023-08-28 ENCOUNTER — Other Ambulatory Visit: Payer: Self-pay | Admitting: Pharmacist

## 2023-08-28 MED ORDER — REPATHA SURECLICK 140 MG/ML ~~LOC~~ SOAJ: 140.0000 mg | SUBCUTANEOUS | 3 refills | Status: AC

## 2023-08-28 MED ORDER — REPATHA SURECLICK 140 MG/ML ~~LOC~~ SOAJ: 140.0000 mg | SUBCUTANEOUS | 3 refills | Status: DC

## 2023-08-28 NOTE — Telephone Encounter (Signed)
*  STAT* If patient is at the pharmacy, call can be transferred to refill team.   1. Which medications need to be refilled? (please list name of each medication and dose if known) Evolocumab (REPATHA SURECLICK) 140 MG/ML SOAJ   2. Which pharmacy/location (including street and city if local pharmacy) is medication to be sent to?  [id number] - [place], [place] - [address]      3. Do they need a 30 day or 90 day supply? 90 day    Pt is out of medication

## 2023-09-02 DIAGNOSIS — N3000 Acute cystitis without hematuria: Secondary | ICD-10-CM | POA: Diagnosis not present

## 2023-09-02 DIAGNOSIS — N3090 Cystitis, unspecified without hematuria: Secondary | ICD-10-CM | POA: Diagnosis not present

## 2023-09-04 ENCOUNTER — Telehealth: Payer: Self-pay | Admitting: Cardiovascular Disease

## 2023-09-04 NOTE — Telephone Encounter (Signed)
Caller stated they will need patient's last 2 current labs showing LDL levels faxed to fax# [phone].  Request ID# [id number].

## 2023-09-05 ENCOUNTER — Other Ambulatory Visit (HOSPITAL_COMMUNITY): Payer: Self-pay

## 2023-09-05 NOTE — Telephone Encounter (Signed)
 Called and spoke to patient and lab work faxed as ordered to [employer] @ [phone].

## 2023-09-11 ENCOUNTER — Other Ambulatory Visit (HOSPITAL_COMMUNITY): Payer: Self-pay

## 2023-09-11 ENCOUNTER — Telehealth: Payer: Self-pay

## 2023-09-11 NOTE — Telephone Encounter (Signed)
 Copied from CRM [id number]. Topic: Clinical - Medical Advice >> Sep 11, 2023 11:14 AM Delon DASEN wrote: Reason for CRM: symptoms of UTI are getting worse, burning when urinating is worse, worried about secondary infection please call the patient  [phone]

## 2023-09-11 NOTE — Telephone Encounter (Signed)
 Called patient and she stated she was treated at the urgent [facility] on the 28 th and she was doing better but is now having burning with urination. Offer patient earlier appointment with another provider stated she feel she will be okay waiting till tomorrow to see Camie Moats.  Called [facility] and requested records.

## 2023-09-12 ENCOUNTER — Ambulatory Visit: Payer: PPO | Admitting: Physician Assistant

## 2023-09-12 ENCOUNTER — Encounter: Payer: Self-pay | Admitting: Physician Assistant

## 2023-09-12 VITALS — BP 152/72 | HR 56 | Temp 97.6°F | Ht 61.0 in | Wt 203.0 lb

## 2023-09-12 DIAGNOSIS — R600 Localized edema: Secondary | ICD-10-CM | POA: Diagnosis not present

## 2023-09-12 DIAGNOSIS — N3001 Acute cystitis with hematuria: Secondary | ICD-10-CM

## 2023-09-12 LAB — POCT URINALYSIS DIP (CLINITEK)
Bilirubin, UA: NEGATIVE
Glucose, UA: NEGATIVE mg/dL
Ketones, POC UA: NEGATIVE mg/dL
Nitrite, UA: NEGATIVE
POC PROTEIN,UA: NEGATIVE
Spec Grav, UA: 1.015 (ref 1.010–1.025)
Urobilinogen, UA: NEGATIVE U/dL
pH, UA: 6 (ref 5.0–8.0)

## 2023-09-12 MED ORDER — POTASSIUM CHLORIDE CRYS ER 10 MEQ PO TBCR: 10.0000 meq | EXTENDED_RELEASE_TABLET | Freq: Every day | ORAL | 1 refills | Status: AC

## 2023-09-12 MED ORDER — FLUCONAZOLE 150 MG PO TABS: 150.0000 mg | ORAL_TABLET | Freq: Every day | ORAL | 1 refills | Status: DC

## 2023-09-12 MED ORDER — FUROSEMIDE 20 MG PO TABS
20.0000 mg | ORAL_TABLET | Freq: Every day | ORAL | 1 refills | Status: AC | PRN
Start: 2023-09-12 — End: ?

## 2023-09-12 MED ORDER — NITROFURANTOIN MONOHYD MACRO 100 MG PO CAPS: 100.0000 mg | ORAL_CAPSULE | Freq: Two times a day (BID) | ORAL | 0 refills | Status: DC

## 2023-09-12 NOTE — Progress Notes (Signed)
 Acute Office Visit  Subjective:    Patient ID: Tonya Graves, female    DOB: 07/21/1951, 73 y.o.   MRN: [id number]  Chief Complaint  Patient presents with   Follow-up    HPI: Patient is in today for complaints of uti She was seen at Urgent Care and treated with omnicef  - states she is still having urine frequency and urgency despite taking meds Also with some mild external vaginal burning   Current Outpatient Medications:    Calcium  Carbonate-Vitamin D  (CALCIUM  500 + D PO), Take 1 tablet by mouth 2 (two) times daily., Disp: , Rfl:    clobetasol cream (TEMOVATE) 0.05 %, SMARTSIG:1 Topical Daily, Disp: , Rfl:    cyclobenzaprine  (FLEXERIL ) 5 MG tablet, Take 1 tablet (5 mg total) by mouth at bedtime., Disp: 30 tablet, Rfl: 1   dicyclomine (BENTYL) 20 MG tablet, Take 10 mg by mouth every 6 (six) hours as needed., Disp: , Rfl:    ELIQUIS  5 MG TABS tablet, TAKE 1 TABLET BY MOUTH TWICE A DAY, Disp: 180 tablet, Rfl: 1   Evolocumab  (REPATHA  SURECLICK) 140 MG/ML SOAJ, Inject 140 mg into the skin every 14 (fourteen) days., Disp: 6 mL, Rfl: 3   famotidine (PEPCID) 40 MG tablet, Take 40 mg by mouth daily., Disp: , Rfl:    fluconazole  (DIFLUCAN ) 150 MG tablet, Take 1 tablet (150 mg total) by mouth daily., Disp: 1 tablet, Rfl: 1   loratadine (CLARITIN) 10 MG tablet, Take 10 mg by mouth at bedtime., Disp: , Rfl:    metoprolol  tartrate (LOPRESSOR ) 25 MG tablet, TAKE 1 TABLET BY MOUTH TWICE A DAY, Disp: 180 tablet, Rfl: 1   NEOMYCIN -POLYMYXIN-HYDROCORTISONE  (CORTISPORIN) 1 % SOLN OTIC solution, Apply 1-2 drops to toe BID, Disp: 10 mL, Rfl: 3   nitrofurantoin , macrocrystal-monohydrate, (MACROBID ) 100 MG capsule, Take 1 capsule (100 mg total) by mouth 2 (two) times daily., Disp: 20 capsule, Rfl: 0   ofloxacin  (FLOXIN ) 0.3 % OTIC solution, Place 5 drops into the right ear daily., Disp: 5 mL, Rfl: 0   pantoprazole  (PROTONIX ) 40 MG tablet, TAKE 1 TABLET BY MOUTH EVERY DAY, Disp: 90 tablet, Rfl: 1    Propylene Glycol (SYSTANE COMPLETE OP), Place 1 drop into both eyes daily., Disp: , Rfl:    Vitamin D , Ergocalciferol , (DRISDOL ) 1.25 MG (50000 UNIT) CAPS capsule, TAKE 1 CAPSULE BY MOUTH ONE TIME PER WEEK, Disp: 12 capsule, Rfl: 5   Wheat Dextrin (BENEFIBER) POWD, Take 1 Scoop by mouth at bedtime., Disp: , Rfl:    furosemide  (LASIX ) 20 MG tablet, Take 1 tablet (20 mg total) by mouth daily as needed for edema., Disp: 90 tablet, Rfl: 1   potassium chloride  (KLOR-CON  M) 10 MEQ tablet, Take 1 tablet (10 mEq total) by mouth daily. When taking furosemide , Disp: 90 tablet, Rfl: 1  No Known Allergies  ROS CONSTITUTIONAL: Negative for chills, fatigue, fever,  CARDIOVASCULAR: Negative for chest pain, RESPIRATORY: Negative for recent cough and dyspnea.  GASTROINTESTINAL: Negative for abdominal pain, acid reflux symptoms, constipation, diarrhea, nausea and vomiting.  GU - see HPI      Objective:    PHYSICAL EXAM:   BP (!) 152/72 (BP Location: Left Arm, Patient Position: Sitting)   Pulse (!) 56   Temp 97.6 F (36.4 C) (Temporal)   Ht 5' 1 (1.549 m)   Wt 203 lb (92.1 kg)   SpO2 96%   BMI 38.36 kg/m    GEN: Well nourished, well developed, in no acute distress  Cardiac:  RRR; no murmurs,  Respiratory:  normal respiratory rate and pattern with no distress - normal breath sounds with no rales, rhonchi, wheezes or rubs  Skin: warm and dry, no rash  Neuro:  Alert and Oriented x 3,- CN II-Xii grossly intact Psych: euthymic mood, appropriate affect and demeanor Office Visit on 09/12/2023  Component Date Value Ref Range Status   Glucose, UA 09/12/2023 negative  negative mg/dL Final   Bilirubin, UA 98/92/7974 negative  negative Final   Ketones, POC UA 09/12/2023 negative  negative mg/dL Final   Spec Grav, UA 98/92/7974 1.015  1.010 - 1.025 Final   Blood, UA 09/12/2023 trace-intact (A)  negative Final   pH, UA 09/12/2023 6.0  5.0 - 8.0 Final   POC PROTEIN,UA 09/12/2023 negative  negative,  trace Final   Urobilinogen, UA 09/12/2023 negative  0.2 or 1.0 E.U./dL Final   Nitrite, UA 98/92/7974 Negative  Negative Final   Leukocytes, UA 09/12/2023 Small (1+) (A)  Negative Final       Assessment & Plan:    Acute hemorrhagic cystitis -     POCT URINALYSIS DIP (CLINITEK) -     Urine Culture -     Nitrofurantoin  Monohyd Macro; Take 1 capsule (100 mg total) by mouth 2 (two) times daily.  Dispense: 20 capsule; Refill: 0  Localized edema -     Furosemide ; Take 1 tablet (20 mg total) by mouth daily as needed for edema.  Dispense: 90 tablet; Refill: 1 -     Potassium Chloride  Crys ER; Take 1 tablet (10 mEq total) by mouth daily. When taking furosemide   Dispense: 90 tablet; Refill: 1  Other orders -     Fluconazole ; Take 1 tablet (150 mg total) by mouth daily.  Dispense: 1 tablet; Refill: 1     Follow-up: Return in about 3 weeks (around 10/03/2023) for nurse visit re-check UA.  An After Visit Summary was printed and given to the patient.  [facility] [phone]

## 2023-09-14 LAB — URINE CULTURE: Organism ID, Bacteria: NO GROWTH

## 2023-09-15 ENCOUNTER — Ambulatory Visit: Payer: PPO

## 2023-09-15 DIAGNOSIS — I639 Cerebral infarction, unspecified: Secondary | ICD-10-CM

## 2023-09-15 LAB — CUP PACEART REMOTE DEVICE CHECK
Date Time Interrogation Session: 20250109230428
Implantable Pulse Generator Implant Date: 20220214

## 2023-10-03 ENCOUNTER — Ambulatory Visit (INDEPENDENT_AMBULATORY_CARE_PROVIDER_SITE_OTHER): Payer: PPO

## 2023-10-03 DIAGNOSIS — N3001 Acute cystitis with hematuria: Secondary | ICD-10-CM

## 2023-10-03 LAB — POCT URINALYSIS DIP (CLINITEK)
Bilirubin, UA: NEGATIVE
Blood, UA: NEGATIVE
Glucose, UA: NEGATIVE mg/dL
Ketones, POC UA: NEGATIVE mg/dL
Leukocytes, UA: NEGATIVE
Nitrite, UA: NEGATIVE
POC PROTEIN,UA: NEGATIVE
Spec Grav, UA: 1.025 (ref 1.010–1.025)
Urobilinogen, UA: NEGATIVE U/dL
pH, UA: 5.5 (ref 5.0–8.0)

## 2023-10-10 ENCOUNTER — Encounter: Payer: Self-pay | Admitting: Family Medicine

## 2023-10-10 ENCOUNTER — Ambulatory Visit: Payer: PPO | Admitting: Family Medicine

## 2023-10-10 VITALS — BP 140/97 | HR 62

## 2023-10-10 DIAGNOSIS — Z0000 Encounter for general adult medical examination without abnormal findings: Secondary | ICD-10-CM

## 2023-10-10 NOTE — Progress Notes (Signed)
 Subjective:   Tonya Graves is a 73 y.o. female who presents for Medicare Annual (Subsequent) preventive examination.  Visit Complete: Virtual I connected with  Tonya Graves on 10/10/23 by a audio enabled telemedicine application and verified that I am speaking with the correct person using two identifiers.  Patient Location: Home  Provider Location: Office/Clinic  I discussed the limitations of evaluation and management by telemedicine. The patient expressed understanding and agreed to proceed.  Vital Signs: Because this visit was a virtual/telehealth visit, some criteria may be missing or patient reported. Any vitals not documented were not able to be obtained and vitals that have been documented are patient reported.  Patient Medicare AWV questionnaire was completed by the patient on 10/10/2023 ; I have confirmed that all information answered by patient is correct and no changes since this date.  Cardiac Risk Factors include: none     Objective:    Today's Vitals   10/10/23 1025  BP: (!) 140/97  Pulse: 62  PainSc: 0-No pain   There is no height or weight on file to calculate BMI.     03/11/2022   10:02 AM 01/03/2022    7:33 PM 12/16/2021   10:12 AM 05/17/2021   10:10 AM 01/25/2021    1:12 PM 08/17/2020   12:54 PM 06/06/2016   12:00 PM  Advanced Directives  Does Patient Have a Medical Advance Directive? No No No No No No No  Would patient like information on creating a medical advance directive?  No - Patient declined Yes (MAU/Ambulatory/Procedural Areas - Information given) No - Patient declined   No - patient declined information    Current Medications (verified) Outpatient Encounter Medications as of 10/10/2023  Medication Sig   Calcium  Carbonate-Vitamin D  (CALCIUM  500 + D PO) Take 1 tablet by mouth 2 (two) times daily.   clobetasol cream (TEMOVATE) 0.05 % SMARTSIG:1 Topical Daily   cyclobenzaprine  (FLEXERIL ) 5 MG tablet Take 1 tablet (5 mg total) by mouth at  bedtime.   dicyclomine (BENTYL) 20 MG tablet Take 10 mg by mouth every 6 (six) hours as needed.   ELIQUIS  5 MG TABS tablet TAKE 1 TABLET BY MOUTH TWICE A DAY   Evolocumab  (REPATHA  SURECLICK) 140 MG/ML SOAJ Inject 140 mg into the skin every 14 (fourteen) days.   famotidine (PEPCID) 40 MG tablet Take 40 mg by mouth daily.   furosemide  (LASIX ) 20 MG tablet Take 1 tablet (20 mg total) by mouth daily as needed for edema.   loratadine (CLARITIN) 10 MG tablet Take 10 mg by mouth at bedtime.   metoprolol  tartrate (LOPRESSOR ) 25 MG tablet TAKE 1 TABLET BY MOUTH TWICE A DAY   NEOMYCIN -POLYMYXIN-HYDROCORTISONE  (CORTISPORIN) 1 % SOLN OTIC solution Apply 1-2 drops to toe BID   pantoprazole  (PROTONIX ) 40 MG tablet TAKE 1 TABLET BY MOUTH EVERY DAY   potassium chloride  (KLOR-CON  M) 10 MEQ tablet Take 1 tablet (10 mEq total) by mouth daily. When taking furosemide    Propylene Glycol (SYSTANE COMPLETE OP) Place 1 drop into both eyes daily.   Vitamin D , Ergocalciferol , (DRISDOL ) 1.25 MG (50000 UNIT) CAPS capsule TAKE 1 CAPSULE BY MOUTH ONE TIME PER WEEK   Wheat Dextrin (BENEFIBER) POWD Take 1 Scoop by mouth at bedtime.   [DISCONTINUED] fluconazole  (DIFLUCAN ) 150 MG tablet Take 1 tablet (150 mg total) by mouth daily.   [DISCONTINUED] nitrofurantoin , macrocrystal-monohydrate, (MACROBID ) 100 MG capsule Take 1 capsule (100 mg total) by mouth 2 (two) times daily.   [DISCONTINUED] ofloxacin  (FLOXIN ) 0.3 %  OTIC solution Place 5 drops into the right ear daily.   No facility-administered encounter medications on file as of 10/10/2023.    Allergies (verified) Patient has no known allergies.   History: Past Medical History:  Diagnosis Date   Arthritis    OA   Atrial fibrillation (HCC)    Bronchitis    Cataracts, bilateral    Dysrhythmia    history Vtac   GERD (gastroesophageal reflux disease)    Heart palpitations 2016 august   High cholesterol    History of hiatal hernia    Osteopenia    Prediabetes     Stroke [facility])    Urine frequency    UTI (urinary tract infection)    Varicose veins    Ventricular tachycardia (paroxysmal) (HCC) 08/2019   30 day monitor.   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   Procedure: RIGHT TOTAL KNEE ARTHROPLASTY;  Surgeon: Dempsey Moan, MD;  Location: [facility];  Service: Orthopedics;  Laterality: Right;   TOTAL KNEE ARTHROPLASTY Left 06/06/2016   Procedure: LEFT TOTAL KNEE ARTHROPLASTY;  Surgeon: Dempsey Moan, MD;  Location: [facility];  Service: Orthopedics;  Laterality: Left;   XI ROBOTIC ASSISTED HIATAL HERNIA REPAIR N/A 01/03/2022   Procedure: XI ROBOTIC TAKEDOWN HIATAL HERNIA with fundoplication;  Surgeon: Gladis Cough, MD;  Location: [facility];  Service: General;  Laterality: N/A;   Family History  Problem Relation Age of Onset   High blood pressure Mother    Atrial fibrillation Mother    Stroke Father    Heart disease Father    High Cholesterol Father    Atrial fibrillation Sister    Cancer Maternal Grandmother    Leukemia Maternal Grandmother    Other Maternal Grandfather        [place] spotted fever   Stroke Paternal Grandmother    Social History   Socioeconomic History   Marital status: Married    Spouse name: Not on file   Number of children: Not on file   Years of education: Not on file   Highest education level: Not on file  Occupational History   Not on file  Tobacco Use   Smoking status: Never   Smokeless tobacco: Never  Vaping Use   Vaping status: Never Used  Substance and Sexual Activity   Alcohol use: No   Drug use: No   Sexual activity: Not on file  Other Topics Concern   Not on file  Social History Narrative   Right handed   Caffeine 1 every week   One story home with husband   Social Drivers of Corporate Investment Banker Strain: Low Risk  (04/11/2023)   Overall  Financial Resource Strain (CARDIA)    Difficulty of Paying Living Expenses: Not hard at all  Food Insecurity: No Food Insecurity (04/11/2023)   Hunger Vital Sign    Worried About Running Out of Food in the Last Year: Never true    Ran Out of Food in the Last Year: Never true  Transportation Needs: No Transportation Needs (04/11/2023)   PRAPARE - Administrator, Civil Service (Medical): No    Lack of Transportation (Non-Medical): No  Physical Activity: Inactive (04/11/2023)   Exercise Vital Sign    Days of Exercise per Week: 0 days  Minutes of Exercise per Session: 0 min  Stress: No Stress Concern Present (04/11/2023)   [employer] of Occupational Health - Occupational Stress Questionnaire    Feeling of Stress : Not at all  Social Connections: Socially Integrated (04/11/2023)   Social Connection and Isolation Panel [NHANES]    Frequency of Communication with Friends and Family: More than three times a week    Frequency of Social Gatherings with Friends and Family: More than three times a week    Attends Religious Services: More than 4 times per year    Active Member of [employer] or Organizations: Yes    Attends Engineer, Structural: More than 4 times per year    Marital Status: Married    Tobacco Counseling Counseling given: Not Answered   Clinical Intake:  Pre-visit preparation completed: Yes  Pain : No/denies pain Pain Score: 0-No pain     Diabetes: No  How often do you need to have someone help you when you read instructions, pamphlets, or other written materials from your doctor or pharmacy?: 1 - Never  Interpreter Needed?: No      Activities of Daily Living    10/10/2023   10:27 AM  In your present state of health, do you have any difficulty performing the following activities:  Hearing? 0  Vision? 0  Difficulty concentrating or making decisions? 0  Walking or climbing stairs? 1  Comment stairs due to knees  Dressing or bathing? 0  Doing  errands, shopping? 0  Preparing Food and eating ? N  Using the Toilet? N  In the past six months, have you accidently leaked urine? Y  Comment due to UTI  Do you have problems with loss of bowel control? N  Managing your Medications? N  Managing your Finances? N  Housekeeping or managing your Housekeeping? N    Patient Care Team: Nicholaus Credit, DEVONNA as PCP - General (Physician Assistant) Court Dorn PARAS, MD as PCP - Cardiology (Cardiology) Cindie Ole DASEN, MD as PCP - Electrophysiology (Cardiology) Skeet Juliene SAUNDERS, DO as Consulting Physician (Neurology)  Indicate any recent Medical Services you may have received from other than [facility] providers in the past year (date may be approximate).     Assessment:   This is a routine wellness examination for Gabriellia.  Hearing/Vision screen No results found.   Goals Addressed             This Visit's Progress    Set My Weight Loss Goal       Follow Up Date 10/09/2024    - set weight loss goal    Why is this important?   Losing only 5 to 15 percent of your weight makes a big difference in your health.    Notes:  Patient plans to eat healthy and work on losing weight for next year.      Depression Screen    10/10/2023   10:36 AM 06/01/2023    8:59 AM 04/11/2023    3:44 PM 11/22/2022    9:23 AM 09/08/2022    3:14 PM 05/20/2022    9:14 AM 11/15/2021    9:34 AM  PHQ 2/9 Scores  PHQ - 2 Score 0 0 0 0 0 0 0  PHQ- 9 Score  0 0  5 3     Fall Risk    10/10/2023   10:36 AM 04/11/2023    3:44 PM 11/22/2022    9:23 AM 09/08/2022    3:16 PM 03/11/2022  10:02 AM  Fall Risk   Falls in the past year? 1 0 0 0 0  Number falls in past yr: 0 0 0 0 0  Injury with Fall? 0 0 0 0 0  Risk for fall due to : No Fall Risks  No Fall Risks No Fall Risks   Follow up Falls evaluation completed Falls evaluation completed Falls evaluation completed Falls evaluation completed     MEDICARE RISK AT HOME:    TIMED UP AND GO:  Was the test performed?  No     Cognitive Function:        10/10/2023   10:37 AM 05/17/2021   10:11 AM  6CIT Screen  What Year? 0 points 0 points  What month? 0 points 0 points  What time? 0 points 0 points  Count back from 20 0 points 0 points  Months in reverse 0 points 0 points  Repeat phrase 0 points 0 points  Total Score 0 points 0 points    Immunizations Immunization History  Administered Date(s) Administered   Fluad Quad(high Dose 65+) 05/12/2020, 05/17/2021, 05/20/2022   Fluad Trivalent(High Dose 65+) 06/01/2023   Influenza-Unspecified 06/26/2019   Moderna Covid-19 Vaccine Bivalent Booster 78yrs & up 08/31/2021   Moderna SARS-COV2 Booster Vaccination 09/15/2020, 03/22/2021   Moderna Sars-Covid-2 Vaccination 10/31/2019, 11/29/2019   Pfizer(Comirnaty)Fall Seasonal Vaccine 12 years and older 11/22/2022   Pneumococcal Conjugate-13 03/28/2017   Pneumococcal Polysaccharide-23 06/13/2018, 07/13/2020   Tdap 03/23/2016, 06/04/2023   Zoster Recombinant(Shingrix) 07/11/2023    TDAP status: Up to date  Flu Vaccine status: Up to date  Pneumococcal vaccine status: Up to date  Covid-19 vaccine status: Declined, Education has been provided regarding the importance of this vaccine but patient still declined. Advised may receive this vaccine at local pharmacy or Health Dept.or vaccine clinic. Aware to provide a copy of the vaccination record if obtained from local pharmacy or Health Dept. Verbalized acceptance and understanding.  Qualifies for Shingles Vaccine? Yes   Zostavax completed No   Shingrix Completed?: Yes  Screening Tests Health Maintenance  Topic Date Due   COVID-19 Vaccine (7 - 2024-25 season) 05/07/2023   Zoster Vaccines- Shingrix (2 of 2) 09/05/2023   MAMMOGRAM  11/30/2023   Medicare Annual Wellness (AWV)  10/09/2024   DEXA SCAN  02/02/2025   Colonoscopy  03/04/2026   DTaP/Tdap/Td (3 - Td or Tdap) 06/03/2033   Pneumonia Vaccine 47+ Years old  Completed   INFLUENZA VACCINE  Completed    HPV VACCINES  Aged Out   Hepatitis C Screening  Discontinued    Health Maintenance  Health Maintenance Due  Topic Date Due   COVID-19 Vaccine (7 - 2024-25 season) 05/07/2023   Zoster Vaccines- Shingrix (2 of 2) 09/05/2023    Colorectal cancer screening: Type of screening: Colonoscopy. Completed 03/04/2021. Repeat every 5 years  Mammogram status: Completed 11/30/22. Repeat every year  Bone Density status: Completed 12/04/2022. Results reflect: Bone density results: OSTEOPENIA. Repeat every 2 years.  Lung Cancer Screening: (Low Dose CT Chest recommended if Age 72-80 years, 20 pack-year currently smoking OR have quit w/in 15years.) does not qualify.    Additional Screening:  Hepatitis C Screening: does not qualify  Vision Screening: Recommended annual ophthalmology exams for early detection of glaucoma and other disorders of the eye. Is the patient up to date with their annual eye exam?  Yes  Who is the provider or what is the name of the office in which the patient attends annual eye exams? [facility], [place], [place] If pt is not established with a provider, would they like to be referred to a provider to establish care? No .   Dental Screening: Recommended annual dental exams for proper oral hygiene  Community Resource Referral / Chronic Care Management: CRR required this visit?  No   CCM required this visit?  No    Plan:     I have personally reviewed and noted the following in the patient's chart:   Medical and social history Use of alcohol, tobacco or illicit drugs  Current medications and supplements including opioid prescriptions. Patient is not currently taking opioid prescriptions. Functional ability and status Nutritional status Physical activity Advanced directives List of other physicians Hospitalizations, surgeries, and ER visits in previous 12 months Vitals Screenings to include cognitive, depression, and falls Referrals and appointments  In  addition, I have reviewed and discussed with patient certain preventive protocols, quality metrics, and best practice recommendations. A written personalized care plan for preventive services as well as general preventive health recommendations were provided to patient.     Harrie Cedar, FNP [facility] [phone]   10/10/2023   After Visit Summary: (Declined) Due to this being a telephonic visit, with patients personalized plan was offered to patient but patient Declined AVS at this time

## 2023-10-17 ENCOUNTER — Ambulatory Visit: Payer: Self-pay | Admitting: Physician Assistant

## 2023-10-17 NOTE — Telephone Encounter (Signed)
  Chief Complaint: blood in stool Symptoms: bloody stool Frequency: 2 times  Disposition: [] ED /[] Urgent Care (no appt availability in office) / [x] Appointment(In office/virtual)/ []  [facility]/ [] Home Care/ [] Refused Recommended Disposition /[] [place] [facility]/ []  Follow-up with PCP Additional Notes: Pt calling with complaints of "bright red blood at the beginning of having a BM.' Pt states it happened once 2 weeks and then once more over the weekend. Minimal blood on tissue. Pt has had several BM since with no bleeding. Pt denies any clots. Pt does say she has lower stomach discomfort while using the bathroom. Pt feels she is constipated and stated " I'm not good at drinking enough." Pt stated no blood in toilet and  undergarments. Pt denies dizziness. Pt is on Eliquis. Per protocol, pt can be seen within 3 days. Pt has PCP tomorrow at 1440. RN gave care advice on what to look for with increased bleeding symptoms. Pt verbalized understanding.           Copied from CRM [id number]. Topic: Clinical - Red Word Triage >> Oct 17, 2023  4:41 PM Martha Clan wrote: Red Word that prompted transfer to Nurse Triage: Patient has blood in her stool Reason for Disposition  MILD rectal bleeding (more than just a few drops or streaks)  Answer Assessment - Initial Assessment Questions 1. APPEARANCE of BLOOD: "What color is it?" "Is it passed separately, on the surface of the stool, or mixed in with the stool?"      Bright red mixed with stool 2. AMOUNT: "How much blood was passed?"      Noticeable  3. FREQUENCY: "How many times has blood been passed with the stools?"      2 times 4. ONSET: "When was the blood first seen in the stools?" (Days or weeks)      2 week ago  5. DIARRHEA: "Is there also some diarrhea?" If Yes, ask: "How many diarrhea stools in the past 24 hours?"      No  6. CONSTIPATION: "Do you have constipation?" If Yes, ask: "How bad is it?"     Yes- goes daily  7.  RECURRENT SYMPTOMS: "Have you had blood in your stools before?" If Yes, ask: "When was the last time?" and "What happened that time?"      no 8. BLOOD THINNERS: "Do you take any blood thinners?" (e.g., Coumadin/warfarin, Pradaxa/dabigatran, aspirin)     Yes-eliquis  9. OTHER SYMPTOMS: "Do you have any other symptoms?"  (e.g., abdomen pain, vomiting, dizziness, fever)     Lower stomach pain  Protocols used: Rectal Bleeding-A-AH

## 2023-10-18 ENCOUNTER — Other Ambulatory Visit: Payer: Self-pay | Admitting: Physician Assistant

## 2023-10-18 ENCOUNTER — Encounter: Payer: Self-pay | Admitting: Physician Assistant

## 2023-10-18 ENCOUNTER — Ambulatory Visit (INDEPENDENT_AMBULATORY_CARE_PROVIDER_SITE_OTHER): Payer: PPO | Admitting: Physician Assistant

## 2023-10-18 VITALS — BP 134/82 | HR 67 | Temp 97.2°F | Resp 18 | Ht 61.0 in | Wt 204.8 lb

## 2023-10-18 DIAGNOSIS — K640 First degree hemorrhoids: Secondary | ICD-10-CM

## 2023-10-18 DIAGNOSIS — K625 Hemorrhage of anus and rectum: Secondary | ICD-10-CM | POA: Diagnosis not present

## 2023-10-18 MED ORDER — HYDROCORT-PRAMOXINE (PERIANAL) 1-1 % EX FOAM: 1.0000 | Freq: Two times a day (BID) | CUTANEOUS | 1 refills | Status: DC

## 2023-10-18 NOTE — Progress Notes (Signed)
Acute Office Visit  Subjective:    Patient ID: Tonya Graves, female    DOB: 06-Mar-1951, 73 y.o.   MRN: [id number]  Chief Complaint  Patient presents with   Rectal Bleeding    HPI: Patient is in today for complaints of rectal bleeding Pt states 2 weeks ago she noted bright red blood in her bowel movement.  Again this weekend she noted blood in bm as well.  She did have some lower abdominal pain this weekend but not now.  Denies nausea or vomiting.  Denies urine symptoms She does stay constipated a lot and noted blood after having to strain She does have well water Pt states she feels discomfort at rectum when having bm but no known hemorrhoids Pt is up to date on colonoscopy  Current Outpatient Medications:    Calcium Carbonate-Vitamin D (CALCIUM 500 + D PO), Take 1 tablet by mouth 2 (two) times daily., Disp: , Rfl:    clobetasol cream (TEMOVATE) 0.05 %, SMARTSIG:1 Topical Daily, Disp: , Rfl:    cyclobenzaprine (FLEXERIL) 5 MG tablet, Take 1 tablet (5 mg total) by mouth at bedtime., Disp: 30 tablet, Rfl: 1   dicyclomine (BENTYL) 20 MG tablet, Take 10 mg by mouth every 6 (six) hours as needed., Disp: , Rfl:    ELIQUIS 5 MG TABS tablet, TAKE 1 TABLET BY MOUTH TWICE A DAY, Disp: 180 tablet, Rfl: 1   Evolocumab (REPATHA SURECLICK) 140 MG/ML SOAJ, Inject 140 mg into the skin every 14 (fourteen) days., Disp: 6 mL, Rfl: 3   famotidine (PEPCID) 40 MG tablet, Take 40 mg by mouth daily., Disp: , Rfl:    furosemide (LASIX) 20 MG tablet, Take 1 tablet (20 mg total) by mouth daily as needed for edema., Disp: 90 tablet, Rfl: 1   hydrocortisone-pramoxine (PROCTOFOAM-HC) rectal foam, Place 1 applicator rectally 2 (two) times daily., Disp: 16.3 g, Rfl: 1   loratadine (CLARITIN) 10 MG tablet, Take 10 mg by mouth at bedtime., Disp: , Rfl:    metoprolol tartrate (LOPRESSOR) 25 MG tablet, TAKE 1 TABLET BY MOUTH TWICE A DAY, Disp: 180 tablet, Rfl: 1   NEOMYCIN-POLYMYXIN-HYDROCORTISONE (CORTISPORIN) 1 %  SOLN OTIC solution, Apply 1-2 drops to toe BID, Disp: 10 mL, Rfl: 3   pantoprazole (PROTONIX) 40 MG tablet, TAKE 1 TABLET BY MOUTH EVERY DAY, Disp: 90 tablet, Rfl: 1   potassium chloride (KLOR-CON M) 10 MEQ tablet, Take 1 tablet (10 mEq total) by mouth daily. When taking furosemide, Disp: 90 tablet, Rfl: 1   Propylene Glycol (SYSTANE COMPLETE OP), Place 1 drop into both eyes daily., Disp: , Rfl:    Vitamin D, Ergocalciferol, (DRISDOL) 1.25 MG (50000 UNIT) CAPS capsule, TAKE 1 CAPSULE BY MOUTH ONE TIME PER WEEK, Disp: 12 capsule, Rfl: 5   Wheat Dextrin (BENEFIBER) POWD, Take 1 Scoop by mouth at bedtime., Disp: , Rfl:   No Known Allergies  ROS CONSTITUTIONAL: Negative for chills, fatigue, fever,   GASTROINTESTINAL: Negative for abdominal pain, acid reflux symptoms, constipation, diarrhea, nausea and vomiting.  GU - negative      Objective:    PHYSICAL EXAM:   BP 134/82 (BP Location: Left Arm, Patient Position: Sitting, Cuff Size: Large)   Pulse 67   Temp (!) 97.2 F (36.2 C) (Temporal)   Resp 18   Ht 5\' 1"  (1.549 m)   Wt 204 lb 12.8 oz (92.9 kg)   SpO2 100%   BMI 38.70 kg/m    GEN: Well nourished, well developed, in no  acute distress  Cardiac: RRR; no murmurs,  Respiratory:  normal respiratory rate and pattern with no distress - normal breath sounds with no rales, rhonchi, wheezes or rubs GI: normal bowel sounds, no masses or tenderness - small external hemorrhoid noted (not bleeding or thrombosed)      Assessment & Plan:    Rectal bleeding -     Cdiff NAA+O+P+Stool Culture -     Hydrocort-Pramoxine (Perianal); Place 1 applicator rectally 2 (two) times daily.  Dispense: 16.3 g; Refill: 1  Grade I hemorrhoids -     Hydrocort-Pramoxine (Perianal); Place 1 applicator rectally 2 (two) times daily.  Dispense: 16.3 g; Refill: 1 Recommend stool softeners  If symptoms persist or worsen will refer to GI for further evaluation    Follow-up: Return if symptoms worsen or fail  to improve.  An After Visit Summary was printed and given to the patient.  Jettie Pagan [facility] [phone]

## 2023-10-19 ENCOUNTER — Telehealth: Payer: Self-pay

## 2023-10-19 DIAGNOSIS — K625 Hemorrhage of anus and rectum: Secondary | ICD-10-CM | POA: Diagnosis not present

## 2023-10-19 NOTE — Telephone Encounter (Signed)
PROCTOFOAM HC rectal foam  The patient came out of the office today stating that the pharmacy told her that they did not have a prescription for her. I reached out to [facility] and was informed that this medication is not on the formulary, and she would have to pay out of pocket for this. Patient was notified.  I spoke with Dr. Sedalia Muta who recommended Preparation H Twice a day. Patient should notice a difference after two to three days at most a week. She can use this inside of her if needed she can use an applicator for this as well

## 2023-10-20 ENCOUNTER — Ambulatory Visit (INDEPENDENT_AMBULATORY_CARE_PROVIDER_SITE_OTHER): Payer: PPO

## 2023-10-20 DIAGNOSIS — I639 Cerebral infarction, unspecified: Secondary | ICD-10-CM | POA: Diagnosis not present

## 2023-10-20 LAB — CUP PACEART REMOTE DEVICE CHECK
Date Time Interrogation Session: 20250213230246
Implantable Pulse Generator Implant Date: 20220214

## 2023-10-20 NOTE — Progress Notes (Signed)
Carelink Summary Report / Loop Recorder

## 2023-10-24 ENCOUNTER — Encounter: Payer: Self-pay | Admitting: Cardiovascular Disease

## 2023-10-24 ENCOUNTER — Ambulatory Visit: Payer: PPO | Attending: Cardiovascular Disease | Admitting: Cardiovascular Disease

## 2023-10-24 ENCOUNTER — Other Ambulatory Visit: Payer: Self-pay | Admitting: Cardiovascular Disease

## 2023-10-24 ENCOUNTER — Other Ambulatory Visit: Payer: Self-pay | Admitting: Physician Assistant

## 2023-10-24 VITALS — BP 155/74 | HR 59 | Ht 61.0 in | Wt 203.2 lb

## 2023-10-24 DIAGNOSIS — R931 Abnormal findings on diagnostic imaging of heart and coronary circulation: Secondary | ICD-10-CM

## 2023-10-24 DIAGNOSIS — R002 Palpitations: Secondary | ICD-10-CM

## 2023-10-24 DIAGNOSIS — E782 Mixed hyperlipidemia: Secondary | ICD-10-CM | POA: Diagnosis not present

## 2023-10-24 DIAGNOSIS — I480 Paroxysmal atrial fibrillation: Secondary | ICD-10-CM

## 2023-10-24 LAB — CDIFF NAA+O+P+STOOL CULTURE
E coli, Shiga toxin Assay: NEGATIVE
Toxigenic C. Difficile by PCR: NEGATIVE

## 2023-10-24 NOTE — Telephone Encounter (Signed)
Prescription refill request for Eliquis received. Indication: CVA Last office visit: 10/24/23  Erlene Quan MD Scr:  0.99 on 06/01/23  Epic Age: 73 Weight: 92.2kg  Based on above findings Eliquis 5mg  twice daily is the appropriate dose.  Refill approved.

## 2023-10-24 NOTE — Patient Instructions (Signed)

## 2023-10-24 NOTE — Assessment & Plan Note (Signed)
History of palpitations status post loop recorder implantation by Dr. Royann Shivers at my request 10/19/2020.  She does have a history of PAF as well as PACs with short atrial runs.  I did refer her to Dr. Lalla Brothers who did not feel that any intervention was required at this time.  She is on Eliquis oral anticoagulation.

## 2023-10-24 NOTE — Assessment & Plan Note (Signed)
Elevated coronary calcium score of 33, the vast majority of which was in the LAD performed 12/20/2021.  She is asymptomatic.  She is at goal with regards to secondary prevention on Repatha.

## 2023-10-24 NOTE — Progress Notes (Signed)
10/24/2023 Tonya Graves   July 23, 1951  [id number]  Primary Physician Marianne Sofia, PA-C Primary Cardiologist: Runell Gess MD Nicholes Calamity, [place]  HPI:  Tonya Graves is a 73 y.o.  mildly overweight married Caucasian female mother 2, grandmother and 3 grandchildren referred to me by Dr. Sedalia Muta for cardiology evaluation because of palpitations.  I last saw her in the office 10/25/2022.  She has seen Dr. Dulce Sellar remotely for similar symptoms that occur during stressful times in her life and was prescribed when necessary metoprolol. She has no cardiac risk factors. She's never had a heart attack or stroke. She has occasional atypical chest pain with does not sound cardiac. Apparently her mother-in-law moved in with them towards the end of December 2018 which caused a lot of stress at that time.  Unfortunately, she has since passed away 04-Dec-2023 of last year and her mother has subsequent passed away as well.  I did perform an event monitor which showed sinus rhythm with PACs and short atrial runs and a 2D echo that was entirely normal.     She had a stroke on 07/10/2020 and was treated at [facility].  Work-up was unrevealing including 2D echo in 2-week event monitor.  She did have some palpitations in January.    Dr. Royann Shivers implanted a loop recorder at my request on 10/19/2020.   She unfortunately lost her husband on 09/25/2022 after 45 years of marriage.  She is living alone.  She has seen Dr. Steffanie Dunn, electrophysiology, for further evaluation of her PAF.  She did have a loop recorder implanted.  She is maintaining sinus rhythm on Eliquis.  Dr. Lalla Brothers did have discussion regarding antiarrhythmic therapy and catheter ablation but currently plans to only treat her medically at this time.  Since I saw her her ago she is remained stable.  She is pretty active in church and doing things with her grandchildren.  She denies chest pain or shortness of breath.   Current Meds  Medication  Sig   Calcium Carbonate-Vitamin D (CALCIUM 500 + D PO) Take 1 tablet by mouth 2 (two) times daily.   clobetasol cream (TEMOVATE) 0.05 % SMARTSIG:1 Topical Daily   cyclobenzaprine (FLEXERIL) 5 MG tablet Take 1 tablet (5 mg total) by mouth at bedtime.   dicyclomine (BENTYL) 20 MG tablet Take 10 mg by mouth every 6 (six) hours as needed.   ELIQUIS 5 MG TABS tablet TAKE 1 TABLET BY MOUTH TWICE A DAY   Evolocumab (REPATHA SURECLICK) 140 MG/ML SOAJ Inject 140 mg into the skin every 14 (fourteen) days.   famotidine (PEPCID) 40 MG tablet Take 40 mg by mouth daily.   furosemide (LASIX) 20 MG tablet Take 1 tablet (20 mg total) by mouth daily as needed for edema.   loratadine (CLARITIN) 10 MG tablet Take 10 mg by mouth at bedtime.   metoprolol tartrate (LOPRESSOR) 25 MG tablet TAKE 1 TABLET BY MOUTH TWICE A DAY   NEOMYCIN-POLYMYXIN-HYDROCORTISONE (CORTISPORIN) 1 % SOLN OTIC solution Apply 1-2 drops to toe BID   pantoprazole (PROTONIX) 40 MG tablet TAKE 1 TABLET BY MOUTH EVERY DAY   potassium chloride (KLOR-CON M) 10 MEQ tablet Take 1 tablet (10 mEq total) by mouth daily. When taking furosemide   PROCTOFOAM HC rectal foam PLACE 1 APPLICATOR RECTALLY 2 TIMES DAILY.   Propylene Glycol (SYSTANE COMPLETE OP) Place 1 drop into both eyes daily.   Vitamin D, Ergocalciferol, (DRISDOL) 1.25 MG (50000 UNIT) CAPS capsule TAKE  1 CAPSULE BY MOUTH ONE TIME PER WEEK   Wheat Dextrin (BENEFIBER) POWD Take 1 Scoop by mouth at bedtime.     No Known Allergies  Social History   Socioeconomic History   Marital status: Married    Spouse name: Not on file   Number of children: Not on file   Years of education: Not on file   Highest education level: Not on file  Occupational History   Not on file  Tobacco Use   Smoking status: Never   Smokeless tobacco: Never  Vaping Use   Vaping status: Never Used  Substance and Sexual Activity   Alcohol use: No   Drug use: No   Sexual activity: Not Currently    Partners: Male     Comment: wodowed  Other Topics Concern   Not on file  Social History Narrative   Right handed   Caffeine 1 every week   One story home with husband   Social Drivers of Corporate investment banker Strain: Low Risk  (04/11/2023)   Overall Financial Resource Strain (CARDIA)    Difficulty of Paying Living Expenses: Not hard at all  Food Insecurity: No Food Insecurity (04/11/2023)   Hunger Vital Sign    Worried About Running Out of Food in the Last Year: Never true    Ran Out of Food in the Last Year: Never true  Transportation Needs: No Transportation Needs (04/11/2023)   PRAPARE - Administrator, Civil Service (Medical): No    Lack of Transportation (Non-Medical): No  Physical Activity: Inactive (04/11/2023)   Exercise Vital Sign    Days of Exercise per Week: 0 days    Minutes of Exercise per Session: 0 min  Stress: No Stress Concern Present (04/11/2023)   [employer] of Occupational Health - Occupational Stress Questionnaire    Feeling of Stress : Not at all  Social Connections: Socially Integrated (04/11/2023)   Social Connection and Isolation Panel [NHANES]    Frequency of Communication with Friends and Family: More than three times a week    Frequency of Social Gatherings with Friends and Family: More than three times a week    Attends Religious Services: More than 4 times per year    Active Member of [employer] or Organizations: Yes    Attends Engineer, structural: More than 4 times per year    Marital Status: Married  Catering manager Violence: Not At Risk (04/11/2023)   Humiliation, Afraid, Rape, and Kick questionnaire    Fear of Current or Ex-Partner: No    Emotionally Abused: No    Physically Abused: No    Sexually Abused: No     Review of Systems: General: negative for chills, fever, night sweats or weight changes.  Cardiovascular: negative for chest pain, dyspnea on exertion, edema, orthopnea, palpitations, paroxysmal nocturnal dyspnea or shortness of  breath Dermatological: negative for rash Respiratory: negative for cough or wheezing Urologic: negative for hematuria Abdominal: negative for nausea, vomiting, diarrhea, bright red blood per rectum, melena, or hematemesis Neurologic: negative for visual changes, syncope, or dizziness All other systems reviewed and are otherwise negative except as noted above.    Blood pressure (!) 155/74, pulse (!) 59, height 5\' 1"  (1.549 m), weight 203 lb 3.2 oz (92.2 kg), SpO2 100%.  General appearance: alert and no distress Neck: no adenopathy, no carotid bruit, no JVD, supple, symmetrical, trachea midline, and thyroid not enlarged, symmetric, no tenderness/mass/nodules Lungs: clear to auscultation bilaterally Heart: regular rate and  rhythm, S1, S2 normal, no murmur, click, rub or gallop Extremities: extremities normal, atraumatic, no cyanosis or edema Pulses: 2+ and symmetric  EKG EKG Interpretation Date/Time:  Tuesday October 24 2023 11:06:17 EST Ventricular Rate:  59 PR Interval:  178 QRS Duration:  128 QT Interval:  430 QTC Calculation: 425 R Axis:   -47  Text Interpretation: Sinus bradycardia Right bundle branch block Left anterior fascicular block Bifascicular block Minimal voltage criteria for LVH, may be normal variant ( R in aVL ) No previous ECGs available Confirmed by Nanetta Batty [phone]) on 10/24/2023 11:48:26 AM    ASSESSMENT AND PLAN:   Palpitations History of palpitations status post loop recorder implantation by Dr. Royann Shivers at my request 10/19/2020.  She does have a history of PAF as well as PACs with short atrial runs.  I did refer her to Dr. Lalla Brothers who did not feel that any intervention was required at this time.  She is on Eliquis oral anticoagulation.  Mixed hyperlipidemia History of hyperlipidemia on Repatha with lipid profile performed 06/01/2023 revealing total cholesterol 182, LDL of 64 HDL 108.  Elevated coronary artery calcium score Elevated coronary calcium  score of 33, the vast majority of which was in the LAD performed 12/20/2021.  She is asymptomatic.  She is at goal with regards to secondary prevention on Repatha.     Runell Gess MD FACP,FACC,FAHA, [facility] 10/24/2023 11:54 AM

## 2023-10-24 NOTE — Telephone Encounter (Signed)
Patient stated she does not need medication

## 2023-10-24 NOTE — Telephone Encounter (Signed)
Can someone please call pt and see if she is needing these drops and what symptoms does she have otherwise is this an automated refill

## 2023-10-24 NOTE — Assessment & Plan Note (Signed)
History of hyperlipidemia on Repatha with lipid profile performed 06/01/2023 revealing total cholesterol 182, LDL of 64 HDL 108.

## 2023-10-30 ENCOUNTER — Telehealth: Payer: Self-pay

## 2023-10-30 ENCOUNTER — Other Ambulatory Visit: Payer: Self-pay | Admitting: Physician Assistant

## 2023-10-30 MED ORDER — HYDROCORTISONE (PERIANAL) 2.5 % EX CREA: 1.0000 | TOPICAL_CREAM | Freq: Two times a day (BID) | CUTANEOUS | 0 refills | Status: AC

## 2023-10-30 NOTE — Telephone Encounter (Signed)
  Patients insurance formulary alternatives are listed below.  Proctofoam HC is requiring a PA.

## 2023-10-30 NOTE — Telephone Encounter (Signed)
Rx sent for proctosol

## 2023-11-20 NOTE — Progress Notes (Signed)
 [facility] Summary Report / Loop Recorder

## 2023-11-23 ENCOUNTER — Other Ambulatory Visit: Payer: Self-pay | Admitting: Physician Assistant

## 2023-11-23 DIAGNOSIS — I4729 Other ventricular tachycardia: Secondary | ICD-10-CM

## 2023-11-24 ENCOUNTER — Ambulatory Visit: Payer: PPO

## 2023-11-24 DIAGNOSIS — I639 Cerebral infarction, unspecified: Secondary | ICD-10-CM | POA: Diagnosis not present

## 2023-11-24 LAB — CUP PACEART REMOTE DEVICE CHECK
Date Time Interrogation Session: 20250320230224
Implantable Pulse Generator Implant Date: 20220214

## 2023-11-29 DIAGNOSIS — Z01419 Encounter for gynecological examination (general) (routine) without abnormal findings: Secondary | ICD-10-CM | POA: Diagnosis not present

## 2023-11-29 DIAGNOSIS — Z1231 Encounter for screening mammogram for malignant neoplasm of breast: Secondary | ICD-10-CM | POA: Diagnosis not present

## 2023-11-29 LAB — HM MAMMOGRAPHY

## 2023-11-30 ENCOUNTER — Ambulatory Visit: Payer: PPO | Admitting: Physician Assistant

## 2023-11-30 ENCOUNTER — Encounter: Payer: Self-pay | Admitting: Physician Assistant

## 2023-11-30 VITALS — BP 130/78 | HR 56 | Temp 98.0°F | Resp 16 | Ht 61.0 in | Wt 205.2 lb

## 2023-11-30 DIAGNOSIS — R7303 Prediabetes: Secondary | ICD-10-CM | POA: Diagnosis not present

## 2023-11-30 DIAGNOSIS — I4729 Other ventricular tachycardia: Secondary | ICD-10-CM | POA: Diagnosis not present

## 2023-11-30 DIAGNOSIS — R931 Abnormal findings on diagnostic imaging of heart and coronary circulation: Secondary | ICD-10-CM | POA: Diagnosis not present

## 2023-11-30 DIAGNOSIS — I480 Paroxysmal atrial fibrillation: Secondary | ICD-10-CM

## 2023-11-30 DIAGNOSIS — E559 Vitamin D deficiency, unspecified: Secondary | ICD-10-CM | POA: Diagnosis not present

## 2023-11-30 DIAGNOSIS — K625 Hemorrhage of anus and rectum: Secondary | ICD-10-CM

## 2023-11-30 DIAGNOSIS — E782 Mixed hyperlipidemia: Secondary | ICD-10-CM | POA: Diagnosis not present

## 2023-11-30 MED ORDER — METOPROLOL TARTRATE 25 MG PO TABS: 25.0000 mg | ORAL_TABLET | Freq: Two times a day (BID) | ORAL | 1 refills | Status: DC

## 2023-11-30 MED ORDER — CYCLOBENZAPRINE HCL 5 MG PO TABS: 5.0000 mg | ORAL_TABLET | Freq: Every day | ORAL | 1 refills | Status: DC

## 2023-11-30 NOTE — Progress Notes (Signed)
 Established Patient Office Visit  Subjective:  Patient ID: Tonya Graves, female    DOB: Sep 19, 1950  Age: 73 y.o. MRN: [id number]  CC:  Chief Complaint  Patient presents with   Medical Management of Chronic Issues    HPI Tonya Graves presents for chronic follow up  Pt with history of hyperlipidemia.  Pt is on Repatha and due for follow up - is trying to watch diet  Pt with history of GERD - stable on Protonix 40mg  qd  Pt with history of vit D def - taking weekly supplement - due to recheck lab  Pt with history of PAF - currently follows with cardiology and stable on Eliquis 5mg  qd She recently had appt with Dr Lalla Brothers and follows with cardiology yearly  Pt with OA of  knee and chronic low back pain - uses flexeril as needed and requests refill  Pt is still having intermittent issues with rectal bleeding - she notes blood in her stool and feels when this happen she feels something in her rectal vault area - recommend to go ahead and follow up with her GI provider for possible anoscopy evaluation  Past Medical History:  Diagnosis Date   Arthritis    OA   Atrial fibrillation (HCC)    Bronchitis    Cataracts, bilateral    Dysrhythmia    history Vtac   GERD (gastroesophageal reflux disease)    Heart palpitations 2016 august   High cholesterol    History of hiatal hernia    Osteopenia    Prediabetes    Stroke [facility])    Urine frequency    UTI (urinary tract infection)    Varicose veins    Ventricular tachycardia (paroxysmal) (HCC) 08/2019   30 day monitor.    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   Procedure: RIGHT TOTAL KNEE ARTHROPLASTY;  Surgeon: Ollen Gross, MD;  Location: [facility];  Service: Orthopedics;  Laterality: Right;   TOTAL KNEE ARTHROPLASTY Left 06/06/2016   Procedure: LEFT TOTAL KNEE ARTHROPLASTY;   Surgeon: Ollen Gross, MD;  Location: [facility];  Service: Orthopedics;  Laterality: Left;   XI ROBOTIC ASSISTED HIATAL HERNIA REPAIR N/A 01/03/2022   Procedure: XI ROBOTIC TAKEDOWN HIATAL HERNIA with fundoplication;  Surgeon: Luretha Murphy, MD;  Location: [facility];  Service: General;  Laterality: N/A;    Family History  Problem Relation Age of Onset   High blood pressure Mother    Atrial fibrillation Mother    Stroke Father    Heart disease Father    High Cholesterol Father    Atrial fibrillation Sister    Cancer Maternal Grandmother    Leukemia Maternal Grandmother    Other Maternal Grandfather        [place]y [place] spotted fever   Stroke Paternal Grandmother     Social History   Socioeconomic History   Marital status: Married    Spouse name: Not on file   Number of children: Not on file   Years of education: Not on file   Highest education level: Not on file  Occupational History   Not on file  Tobacco Use   Smoking status: Never   Smokeless tobacco: Never  Vaping Use   Vaping status: Never Used  Substance and Sexual Activity  Alcohol use: No   Drug use: No   Sexual activity: Not Currently    Partners: Male    Comment: wodowed  Other Topics Concern   Not on file  Social History Narrative   Right handed   Caffeine 1 every week   One story home with husband   Social Drivers of Health   Financial Resource Strain: Low Risk  (11/30/2023)   Overall Financial Resource Strain (CARDIA)    Difficulty of Paying Living Expenses: Not hard at all  Food Insecurity: No Food Insecurity (11/30/2023)   Hunger Vital Sign    Worried About Running Out of Food in the Last Year: Never true    Ran Out of Food in the Last Year: Never true  Transportation Needs: No Transportation Needs (11/30/2023)   PRAPARE - Administrator, Civil Service (Medical): No    Lack of Transportation (Non-Medical): No  Physical Activity: Inactive (11/30/2023)   Exercise Vital Sign    Days of  Exercise per Week: 0 days    Minutes of Exercise per Session: 0 min  Stress: No Stress Concern Present (11/30/2023)   [employer] of Occupational Health - Occupational Stress Questionnaire    Feeling of Stress : Not at all  Social Connections: Socially Integrated (11/30/2023)   Social Connection and Isolation Panel [NHANES]    Frequency of Communication with Friends and Family: More than three times a week    Frequency of Social Gatherings with Friends and Family: More than three times a week    Attends Religious Services: More than 4 times per year    Active Member of [employer] or Organizations: Yes    Attends Banker Meetings: 1 to 4 times per year    Marital Status: Married  Catering manager Violence: Not At Risk (11/30/2023)   Humiliation, Afraid, Rape, and Kick questionnaire    Fear of Current or Ex-Partner: No    Emotionally Abused: No    Physically Abused: No    Sexually Abused: No     Current Outpatient Medications:    Calcium Carbonate-Vitamin D (CALCIUM 500 + D PO), Take 1 tablet by mouth 2 (two) times daily., Disp: , Rfl:    clobetasol cream (TEMOVATE) 0.05 %, SMARTSIG:1 Topical Daily, Disp: , Rfl:    dicyclomine (BENTYL) 20 MG tablet, Take 10 mg by mouth every 6 (six) hours as needed., Disp: , Rfl:    ELIQUIS 5 MG TABS tablet, TAKE 1 TABLET BY MOUTH TWICE A DAY, Disp: 180 tablet, Rfl: 1   Evolocumab (REPATHA SURECLICK) 140 MG/ML SOAJ, Inject 140 mg into the skin every 14 (fourteen) days., Disp: 6 mL, Rfl: 3   famotidine (PEPCID) 40 MG tablet, Take 40 mg by mouth daily., Disp: , Rfl:    furosemide (LASIX) 20 MG tablet, Take 1 tablet (20 mg total) by mouth daily as needed for edema., Disp: 90 tablet, Rfl: 1   hydrocortisone (PROCTOSOL HC) 2.5 % rectal cream, Place 1 Application rectally 2 (two) times daily., Disp: 30 g, Rfl: 0   loratadine (CLARITIN) 10 MG tablet, Take 10 mg by mouth at bedtime., Disp: , Rfl:    NEOMYCIN-POLYMYXIN-HYDROCORTISONE (CORTISPORIN) 1 %  SOLN OTIC solution, Apply 1-2 drops to toe BID, Disp: 10 mL, Rfl: 3   nystatin cream (MYCOSTATIN), APPLY TO THE AFFECTED AREA(S) BY TOPICAL ROUTE 2 TIMES PER DAY, Disp: , Rfl:    pantoprazole (PROTONIX) 40 MG tablet, TAKE 1 TABLET BY MOUTH EVERY DAY, Disp: 90 tablet, Rfl: 1  potassium chloride (KLOR-CON M) 10 MEQ tablet, Take 1 tablet (10 mEq total) by mouth daily. When taking furosemide, Disp: 90 tablet, Rfl: 1   Propylene Glycol (SYSTANE COMPLETE OP), Place 1 drop into both eyes daily., Disp: , Rfl:    triamcinolone cream (KENALOG) 0.1 %, APPLY A THIN LAYER TO THE AFFECTED AREA(S) BY TOPICAL ROUTE 2 TIMES PER DAY, Disp: , Rfl:    Vitamin D, Ergocalciferol, (DRISDOL) 1.25 MG (50000 UNIT) CAPS capsule, TAKE 1 CAPSULE BY MOUTH ONE TIME PER WEEK, Disp: 12 capsule, Rfl: 5   Wheat Dextrin (BENEFIBER) POWD, Take 1 Scoop by mouth at bedtime., Disp: , Rfl:    cyclobenzaprine (FLEXERIL) 5 MG tablet, Take 1 tablet (5 mg total) by mouth at bedtime., Disp: 30 tablet, Rfl: 1   No Known Allergies CONSTITUTIONAL: Negative for chills, fatigue, fever, unintentional weight gain and unintentional weight loss.  E/N/T: Negative for ear pain, nasal congestion and sore throat.  CARDIOVASCULAR: Negative for chest pain, dizziness, palpitations and pedal edema.  RESPIRATORY: Negative for recent cough and dyspnea.  GASTROINTESTINAL: Negative for abdominal pain, acid reflux symptoms, constipation, diarrhea, nausea and vomiting. - see HPI MSK: Negative for arthralgias and myalgias.  INTEGUMENTARY: Negative for rash.  NEUROLOGICAL: Negative for dizziness and headaches.  PSYCHIATRIC: Negative for sleep disturbance and to question depression screen.  Negative for depression, negative for anhedonia.        Objective:  PHYSICAL EXAM:   VS: BP 130/78   Pulse (!) 56   Temp 98 F (36.7 C) (Temporal)   Resp 16   Ht 5\' 1"  (1.549 m)   Wt 205 lb 3.2 oz (93.1 kg)   SpO2 99%   BMI 38.77 kg/m   GEN: Well nourished, well  developed, in no acute distress   Cardiac: RRR; no murmurs, rubs, or gallops,no edema - no significant varicosities Respiratory:  normal respiratory rate and pattern with no distress - normal breath sounds with no rales, rhonchi, wheezes or rubs  MS: no deformity or atrophy  Skin: warm and dry, no rash  Neuro:  Alert and Oriented x 3,  - CN II-Xii grossly intact Psych: euthymic mood, appropriate affect and demeanor  There are no preventive care reminders to display for this patient.   There are no preventive care reminders to display for this patient.  Lab Results  Component Value Date   TSH 1.720 06/01/2023   Lab Results  Component Value Date   WBC 5.0 06/01/2023   HGB 11.7 06/01/2023   HCT 36.8 06/01/2023   MCV 87 06/01/2023   PLT 256 06/01/2023   Lab Results  Component Value Date   NA 139 06/01/2023   K 4.9 06/01/2023   CO2 24 06/01/2023   GLUCOSE 100 (H) 06/01/2023   BUN 23 06/01/2023   CREATININE 0.99 06/01/2023   BILITOT 0.2 06/01/2023   ALKPHOS 60 06/01/2023   AST 17 06/01/2023   ALT 12 06/01/2023   PROT 6.4 06/01/2023   ALBUMIN 3.7 (L) 06/01/2023   CALCIUM 9.2 06/01/2023   ANIONGAP 5 01/05/2022   EGFR 61 06/01/2023   Lab Results  Component Value Date   CHOL 182 06/01/2023   Lab Results  Component Value Date   HDL 108 06/01/2023   Lab Results  Component Value Date   LDLCALC 64 06/01/2023   Lab Results  Component Value Date   TRIG 49 06/01/2023   Lab Results  Component Value Date   CHOLHDL 1.7 06/01/2023   Lab Results  Component Value  Date   HGBA1C 6.0 (H) 06/01/2023      Assessment & Plan:   Problem List Items Addressed This Visit       Cardiovascular and Mediastinum   PAF (paroxysmal atrial fibrillation) (HCC)   Relevant Orders   CBC with Differential/Platelet   Comprehensive metabolic panel   TSH Continue med Follow up with cardiology     Other   Elevated coronart artery score   Relevant Orders   Comprehensive metabolic  panel Lipid panel pending Continue repatha Follow up with Dr Allyson Sabal   Vitamin D insufficiency   Relevant Orders   VITAMIN D 25 Hydroxy (Vit-D Deficiency, Fractures)   Other Visit Diagnoses     Prediabetes       Relevant Orders   Hemoglobin A1c Watch diet      Rectal bleeding Refer to GI for further evaluation            GERD Continue protonix         Meds ordered this encounter  Medications   cyclobenzaprine (FLEXERIL) 5 MG tablet    Sig: Take 1 tablet (5 mg total) by mouth at bedtime.    Dispense:  30 tablet    Refill:  1    Supervising Provider:   Blane Ohara [[id number]]   DISCONTD: metoprolol tartrate (LOPRESSOR) 25 MG tablet    Sig: Take 1 tablet (25 mg total) by mouth 2 (two) times daily.    Dispense:  180 tablet    Refill:  1    Supervising Provider:   Blane Ohara [id number]    Follow-up: Return in about 6 months (around 06/01/2024) for chronic fasting follow-up.    SARA R Jakeob Tullis, PA-C

## 2023-12-01 LAB — CBC WITH DIFFERENTIAL/PLATELET
Basophils Absolute: 0 10*3/uL (ref 0.0–0.2)
Basos: 1 %
EOS (ABSOLUTE): 0.1 10*3/uL (ref 0.0–0.4)
Eos: 1 %
Hematocrit: 36.7 % (ref 34.0–46.6)
Hemoglobin: 11.9 g/dL (ref 11.1–15.9)
Immature Grans (Abs): 0 10*3/uL (ref 0.0–0.1)
Immature Granulocytes: 0 %
Lymphocytes Absolute: 1.3 10*3/uL (ref 0.7–3.1)
Lymphs: 32 %
MCH: 27.7 pg (ref 26.6–33.0)
MCHC: 32.4 g/dL (ref 31.5–35.7)
MCV: 85 fL (ref 79–97)
Monocytes Absolute: 0.4 10*3/uL (ref 0.1–0.9)
Monocytes: 10 %
Neutrophils Absolute: 2.3 10*3/uL (ref 1.4–7.0)
Neutrophils: 56 %
Platelets: 251 10*3/uL (ref 150–450)
RBC: 4.3 x10E6/uL (ref 3.77–5.28)
RDW: 13.3 % (ref 11.7–15.4)
WBC: 4 10*3/uL (ref 3.4–10.8)

## 2023-12-01 LAB — COMPREHENSIVE METABOLIC PANEL WITH GFR
ALT: 13 IU/L (ref 0–32)
AST: 23 IU/L (ref 0–40)
Albumin: 3.9 g/dL (ref 3.8–4.8)
Alkaline Phosphatase: 72 IU/L (ref 44–121)
BUN/Creatinine Ratio: 20 (ref 12–28)
BUN: 20 mg/dL (ref 8–27)
Bilirubin Total: 0.2 mg/dL (ref 0.0–1.2)
CO2: 24 mmol/L (ref 20–29)
Calcium: 9.3 mg/dL (ref 8.7–10.3)
Chloride: 105 mmol/L (ref 96–106)
Creatinine, Ser: 1.01 mg/dL — ABNORMAL HIGH (ref 0.57–1.00)
Globulin, Total: 2.8 g/dL (ref 1.5–4.5)
Glucose: 89 mg/dL (ref 70–99)
Potassium: 4.7 mmol/L (ref 3.5–5.2)
Sodium: 142 mmol/L (ref 134–144)
Total Protein: 6.7 g/dL (ref 6.0–8.5)
eGFR: 60 mL/min/{1.73_m2} (ref >59)

## 2023-12-01 LAB — VITAMIN D 25 HYDROXY (VIT D DEFICIENCY, FRACTURES): Vit D, 25-Hydroxy: 87.7 ng/mL (ref 30.0–100.0)

## 2023-12-01 LAB — LIPID PANEL
Chol/HDL Ratio: 1.6 ratio (ref 0.0–4.4)
Cholesterol, Total: 181 mg/dL (ref 100–199)
HDL: 111 mg/dL (ref >39)
LDL Chol Calc (NIH): 60 mg/dL (ref 0–99)
Triglycerides: 50 mg/dL (ref 0–149)
VLDL Cholesterol Cal: 10 mg/dL (ref 5–40)

## 2023-12-01 LAB — HEMOGLOBIN A1C
Est. average glucose Bld gHb Est-mCnc: 126 mg/dL
Hgb A1c MFr Bld: 6 % — ABNORMAL HIGH (ref 4.8–5.6)

## 2023-12-01 LAB — TSH: TSH: 1.88 u[IU]/mL (ref 0.450–4.500)

## 2023-12-07 DIAGNOSIS — H53461 Homonymous bilateral field defects, right side: Secondary | ICD-10-CM | POA: Diagnosis not present

## 2023-12-07 DIAGNOSIS — Z961 Presence of intraocular lens: Secondary | ICD-10-CM | POA: Diagnosis not present

## 2023-12-29 ENCOUNTER — Ambulatory Visit (INDEPENDENT_AMBULATORY_CARE_PROVIDER_SITE_OTHER): Payer: PPO

## 2023-12-29 DIAGNOSIS — I639 Cerebral infarction, unspecified: Secondary | ICD-10-CM

## 2023-12-29 LAB — CUP PACEART REMOTE DEVICE CHECK
Date Time Interrogation Session: 20250424230549
Implantable Pulse Generator Implant Date: 20220214

## 2024-01-02 NOTE — Progress Notes (Signed)
 [facility] Summary Report / Loop Recorder

## 2024-01-25 DIAGNOSIS — D649 Anemia, unspecified: Secondary | ICD-10-CM | POA: Diagnosis not present

## 2024-01-25 DIAGNOSIS — K625 Hemorrhage of anus and rectum: Secondary | ICD-10-CM | POA: Diagnosis not present

## 2024-01-25 DIAGNOSIS — K5900 Constipation, unspecified: Secondary | ICD-10-CM | POA: Diagnosis not present

## 2024-01-25 DIAGNOSIS — K5790 Diverticulosis of intestine, part unspecified, without perforation or abscess without bleeding: Secondary | ICD-10-CM | POA: Diagnosis not present

## 2024-01-25 DIAGNOSIS — K219 Gastro-esophageal reflux disease without esophagitis: Secondary | ICD-10-CM | POA: Diagnosis not present

## 2024-01-25 DIAGNOSIS — R109 Unspecified abdominal pain: Secondary | ICD-10-CM | POA: Diagnosis not present

## 2024-01-25 DIAGNOSIS — R748 Abnormal levels of other serum enzymes: Secondary | ICD-10-CM | POA: Diagnosis not present

## 2024-02-02 ENCOUNTER — Ambulatory Visit (INDEPENDENT_AMBULATORY_CARE_PROVIDER_SITE_OTHER): Payer: PPO

## 2024-02-02 DIAGNOSIS — I639 Cerebral infarction, unspecified: Secondary | ICD-10-CM | POA: Diagnosis not present

## 2024-02-02 LAB — CUP PACEART REMOTE DEVICE CHECK
Date Time Interrogation Session: 20250529231937
Implantable Pulse Generator Implant Date: 20220214

## 2024-02-05 ENCOUNTER — Ambulatory Visit: Payer: Self-pay | Admitting: Cardiovascular Disease

## 2024-02-05 NOTE — Progress Notes (Signed)
 [facility] Summary Report / Loop Recorder

## 2024-02-07 ENCOUNTER — Encounter: Payer: Self-pay | Admitting: Physician Assistant

## 2024-02-07 ENCOUNTER — Ambulatory Visit (INDEPENDENT_AMBULATORY_CARE_PROVIDER_SITE_OTHER): Admitting: Physician Assistant

## 2024-02-07 VITALS — BP 114/70 | HR 69 | Temp 98.2°F | Resp 18 | Ht 61.0 in | Wt 205.0 lb

## 2024-02-07 DIAGNOSIS — R600 Localized edema: Secondary | ICD-10-CM

## 2024-02-07 DIAGNOSIS — H6121 Impacted cerumen, right ear: Secondary | ICD-10-CM | POA: Diagnosis not present

## 2024-02-07 NOTE — Progress Notes (Addendum)
 Subjective:  Patient ID: Tonya Graves, female    DOB: [birth year]  Age: 73 y.o. MRN: [id number]  Chief Complaint  Patient presents with   Ear Pain    right   Edema    Ankle Bilateral    HPI Pt complains of right intermittent ear pain - states it has bothered her off and on for the past few months - denies cough, cold and congestion States pain only lasts a few seconds and then goes away Denies any hearing loss  Pt complains of intermittent bilateral ankle swelling.  States it gets worse when she is on her feet for longer periods of time.  She is prescribed lasix  by her cardiologist but was advised only to take medication as needed when she noted weight gain.  I advise low salt diet and the fact she needs to take lasix  as needed for the swelling - start at 1 tablet every other day for the next few weeks and see if helpful Pt denies chest pain or dyspnea     11/30/2023    8:53 AM 11/30/2023    8:52 AM 10/10/2023   10:36 AM 06/01/2023    8:59 AM 04/11/2023    3:44 PM  Depression screen PHQ 2/9  Decreased Interest 0 0 0 0 0  Down, Depressed, Hopeless 0 0 0 0 0  PHQ - 2 Score 0 0 0 0 0  Altered sleeping    0 0  Tired, decreased energy    0 0  Change in appetite    0 0  Feeling bad or failure about yourself     0 0  Trouble concentrating    0 0  Moving slowly or fidgety/restless    0 0  Suicidal thoughts    0 0  PHQ-9 Score    0 0  Difficult doing work/chores    Not difficult at all Not difficult at all        09/08/2022    3:16 PM 11/22/2022    9:23 AM 04/11/2023    3:44 PM 10/10/2023   10:36 AM 11/30/2023    8:53 AM  Fall Risk  Falls in the past year? 0 0 0 1 0  Was there an injury with Fall? 0 0 0 0 0  Fall Risk Category Calculator 0 0 0 1 0  Fall Risk Category (Retired) Low      (RETIRED) Patient Fall Risk Level Low fall risk      Patient at Risk for Falls Due to No Fall Risks No Fall Risks  No Fall Risks No Fall Risks  Fall risk Follow up Falls evaluation completed Falls  evaluation completed Falls evaluation completed Falls evaluation completed Falls evaluation completed     ROS CONSTITUTIONAL: Negative for chills, fatigue, fever,  HEENT - see HPI CARDIOVASCULAR: Negative for chest pain, dizziness, palpitations  - does have pedal edema RESPIRATORY: Negative for recent cough and dyspnea.   PSYCHIATRIC: Negative for sleep disturbance and to question depression screen.  Negative for depression, negative for anhedonia.    Current Outpatient Medications:    Calcium  Carbonate-Vitamin D  (CALCIUM  500 + D PO), Take 1 tablet by mouth 2 (two) times daily., Disp: , Rfl:    clobetasol cream (TEMOVATE) 0.05 %, SMARTSIG:1 Topical Daily, Disp: , Rfl:    cyclobenzaprine  (FLEXERIL ) 5 MG tablet, Take 1 tablet (5 mg total) by mouth at bedtime., Disp: 30 tablet, Rfl: 1   dicyclomine (BENTYL) 20 MG tablet, Take 10 mg by mouth  every 6 (six) hours as needed., Disp: , Rfl:    ELIQUIS  5 MG TABS tablet, TAKE 1 TABLET BY MOUTH TWICE A DAY, Disp: 180 tablet, Rfl: 1   Evolocumab  (REPATHA  SURECLICK) 140 MG/ML SOAJ, Inject 140 mg into the skin every 14 (fourteen) days., Disp: 6 mL, Rfl: 3   famotidine (PEPCID) 40 MG tablet, Take 40 mg by mouth daily., Disp: , Rfl:    furosemide  (LASIX ) 20 MG tablet, Take 1 tablet (20 mg total) by mouth daily as needed for edema., Disp: 90 tablet, Rfl: 1   hydrocortisone  (PROCTOSOL HC) 2.5 % rectal cream, Place 1 Application rectally 2 (two) times daily., Disp: 30 g, Rfl: 0   loratadine (CLARITIN) 10 MG tablet, Take 10 mg by mouth at bedtime., Disp: , Rfl:    NEOMYCIN -POLYMYXIN-HYDROCORTISONE  (CORTISPORIN) 1 % SOLN OTIC solution, Apply 1-2 drops to toe BID, Disp: 10 mL, Rfl: 3   nystatin cream (MYCOSTATIN), APPLY TO THE AFFECTED AREA(S) BY TOPICAL ROUTE 2 TIMES PER DAY, Disp: , Rfl:    pantoprazole  (PROTONIX ) 40 MG tablet, TAKE 1 TABLET BY MOUTH EVERY DAY, Disp: 90 tablet, Rfl: 1   potassium chloride  (KLOR-CON  M) 10 MEQ tablet, Take 1 tablet (10 mEq total)  by mouth daily. When taking furosemide , Disp: 90 tablet, Rfl: 1   Propylene Glycol (SYSTANE COMPLETE OP), Place 1 drop into both eyes daily., Disp: , Rfl:    triamcinolone  cream (KENALOG ) 0.1 %, APPLY A THIN LAYER TO THE AFFECTED AREA(S) BY TOPICAL ROUTE 2 TIMES PER DAY, Disp: , Rfl:    Vitamin D , Ergocalciferol , (DRISDOL ) 1.25 MG (50000 UNIT) CAPS capsule, TAKE 1 CAPSULE BY MOUTH ONE TIME PER WEEK, Disp: 12 capsule, Rfl: 5   Wheat Dextrin (BENEFIBER) POWD, Take 1 Scoop by mouth at bedtime., Disp: , Rfl:   Past Medical History:  Diagnosis Date   Arthritis    OA   Atrial fibrillation (HCC)    Bronchitis    Cataracts, bilateral    Dysrhythmia    history Vtac   GERD (gastroesophageal reflux disease)    Heart palpitations 2016 august   High cholesterol    History of hiatal hernia    Osteopenia    Prediabetes    [facility]roke [facility])    Urine frequency    UTI (urinary tract infection)    Varicose veins    Ventricular tachycardia (paroxysmal) (HCC) 08/2019   30 day monitor.   Objective:  PHYSICAL EXAM:   BP 114/70   Pulse 69   Temp 98.2 F (36.8 C) (Temporal)   Resp 18   Ht 5\' 1"  (1.549 m)   Wt 205 lb (93 kg)   SpO2 98%   BMI 38.73 kg/m    GEN: Well nourished, well developed, in no acute distress  HEENT: right TM with impacted cerumen - left TM normal - right is irrigated and TM and canal normal Cardiac: RRR; no murmurs, rubs, or gallops, - 1+ edema bilateral ankles Respiratory:  normal respiratory rate and pattern with no distress - normal breath sounds with no rales, rhonchi, wheezes or rubs  Psych: euthymic mood, appropriate affect and demeanor  Assessment & Plan:    Impacted cerumen of right ear - resolved  Localized edema Take lasix  regularly - start at 1 pill every other day Low salt diet Increase potassium in diet    Follow-up: Return if symptoms worsen or fail to improve.  An After Visit Summary was printed and given to the patient.  SARA R Zack Crager, PA-C [facility] [phone]

## 2024-03-04 ENCOUNTER — Ambulatory Visit: Payer: Self-pay | Admitting: Cardiovascular Disease

## 2024-03-04 ENCOUNTER — Ambulatory Visit (INDEPENDENT_AMBULATORY_CARE_PROVIDER_SITE_OTHER)

## 2024-03-04 DIAGNOSIS — I639 Cerebral infarction, unspecified: Secondary | ICD-10-CM

## 2024-03-04 LAB — CUP PACEART REMOTE DEVICE CHECK
Date Time Interrogation Session: 20250629231700
Implantable Pulse Generator Implant Date: 20220214

## 2024-03-06 ENCOUNTER — Telehealth: Payer: Self-pay

## 2024-03-06 NOTE — Telephone Encounter (Signed)
 error

## 2024-03-11 ENCOUNTER — Ambulatory Visit: Payer: PPO

## 2024-03-12 ENCOUNTER — Other Ambulatory Visit: Payer: Self-pay | Admitting: Family Medicine

## 2024-03-19 NOTE — Progress Notes (Signed)
 [facility] Summary Report / Loop Recorder

## 2024-03-20 ENCOUNTER — Other Ambulatory Visit: Payer: Self-pay | Admitting: Cardiovascular Disease

## 2024-03-20 DIAGNOSIS — I480 Paroxysmal atrial fibrillation: Secondary | ICD-10-CM

## 2024-03-20 NOTE — Telephone Encounter (Signed)
 Prescription refill request for Eliquis  received. Indication: PAF/CVA Last office visit: 10/24/23   JINNY Lesches MD Scr: 1.01 on 11/30/23  Epic Age: 73 Weight: 92.2kg  Based on above findings Eliquis  5mg  twice daily is the appropriate dose.  Refill approved.

## 2024-03-28 DIAGNOSIS — R748 Abnormal levels of other serum enzymes: Secondary | ICD-10-CM | POA: Diagnosis not present

## 2024-03-28 DIAGNOSIS — K5790 Diverticulosis of intestine, part unspecified, without perforation or abscess without bleeding: Secondary | ICD-10-CM | POA: Diagnosis not present

## 2024-03-28 DIAGNOSIS — R109 Unspecified abdominal pain: Secondary | ICD-10-CM | POA: Diagnosis not present

## 2024-03-28 DIAGNOSIS — K219 Gastro-esophageal reflux disease without esophagitis: Secondary | ICD-10-CM | POA: Diagnosis not present

## 2024-03-28 DIAGNOSIS — K5900 Constipation, unspecified: Secondary | ICD-10-CM | POA: Diagnosis not present

## 2024-03-28 DIAGNOSIS — D649 Anemia, unspecified: Secondary | ICD-10-CM | POA: Diagnosis not present

## 2024-03-28 DIAGNOSIS — K625 Hemorrhage of anus and rectum: Secondary | ICD-10-CM | POA: Diagnosis not present

## 2024-04-04 ENCOUNTER — Ambulatory Visit

## 2024-04-04 DIAGNOSIS — I639 Cerebral infarction, unspecified: Secondary | ICD-10-CM

## 2024-04-04 LAB — CUP PACEART REMOTE DEVICE CHECK
Date Time Interrogation Session: 20250730230838
Implantable Pulse Generator Implant Date: 20220214

## 2024-04-04 NOTE — Progress Notes (Signed)
 [facility] Summary Report / Loop Recorder

## 2024-04-09 ENCOUNTER — Ambulatory Visit: Payer: Self-pay | Admitting: Cardiovascular Disease

## 2024-04-12 ENCOUNTER — Ambulatory Visit: Payer: PPO

## 2024-04-19 ENCOUNTER — Other Ambulatory Visit: Payer: Self-pay | Admitting: Physician Assistant

## 2024-05-06 ENCOUNTER — Ambulatory Visit

## 2024-05-06 DIAGNOSIS — I480 Paroxysmal atrial fibrillation: Secondary | ICD-10-CM | POA: Diagnosis not present

## 2024-05-08 LAB — CUP PACEART REMOTE DEVICE CHECK
Date Time Interrogation Session: 20250830230308
Implantable Pulse Generator Implant Date: 20220214

## 2024-05-10 ENCOUNTER — Ambulatory Visit: Payer: Self-pay | Admitting: Cardiovascular Disease

## 2024-05-14 NOTE — Progress Notes (Signed)
 Remote Loop Recorder Transmission

## 2024-05-17 ENCOUNTER — Ambulatory Visit: Payer: PPO

## 2024-05-31 ENCOUNTER — Ambulatory Visit: Admitting: Physician Assistant

## 2024-06-03 ENCOUNTER — Ambulatory Visit: Payer: Self-pay | Admitting: *Deleted

## 2024-06-03 NOTE — Telephone Encounter (Signed)
 Called patient left voice mail for patient to call office back

## 2024-06-03 NOTE — Progress Notes (Signed)
 Remote Loop Recorder Transmission

## 2024-06-03 NOTE — Telephone Encounter (Signed)
 FYI Only or Action Required?: Action required by provider: update on patient condition and please advise if patient can be seen tomorrow or 06/07/24 for leg bruising swelling and 6 month f/u appt together .  Patient was last seen in primary care on 02/07/2024 by Nicholaus Credit, PA-C.  Called Nurse Triage reporting Leg Injury.  Symptoms began several weeks ago.  Interventions attempted: Rest, hydration, or home remedies and Ice/heat application.  Symptoms are: unchanged.  Triage Disposition: See Physician Within 24 Hours  Patient/caregiver understands and will follow disposition?: Yes           Copied from CRM #[id number]. Topic: Clinical - Red Word Triage >> Jun 03, 2024 11:42 AM Winona R wrote: Pt hit her leg with car door about two weeks ago and it Is still swollen and burns some times. Some discoloration from bruising Reason for Disposition  Large swelling or bruise (> 2 inches or 5 cm)  Answer Assessment - Initial Assessment Questions Appt scheduled for tomorrow for bruising  for evaluation. Patient has appt for 6 month f/u scheduled for 06/07/24. Please advise if patient can be seen for both appt on same day either tomorrow or 06/07/24. Appt scheduled tomorrow due to patient with bruising and takes eliquis . No worsening swelling. Please advise and call patient back on cell # [phone].      1. MECHANISM: How did the injury happen? (e.g., twisting injury, direct blow)      2 weeks ago, end / point of car door hit right shin area of leg 2. ONSET: When did the injury happen? (e.g., minutes, hours ago)      2 weeks ago  3. LOCATION: Where is the injury located?      Right shin 4. APPEARANCE of INJURY: What does the injury look like?  (e.g., deformity of leg)     Bruised goose egg right shin  5. SEVERITY: Can you put weight on that leg? Can you walk?      Yes , can walk  6. SIZE: For cuts, bruises, or swelling, ask: How large is it? (e.g., inches or  centimeters)      Bruising persists to right shin  7. PAIN: Is there pain? If Yes, ask: How bad is the pain?   What does it keep you from doing? (Scale 0-10; or none, mild, moderate, severe)     Mild discomfort did not break skin .  8. TETANUS: For any breaks in the skin, ask: When was your last tetanus booster?     Na did not break skin  9. OTHER SYMPTOMS: Do you have any other symptoms?      Takes eliquis   10. PREGNANCY: Is there any chance you are pregnant? When was your last menstrual period?       na  Protocols used: Leg Injury-A-AH

## 2024-06-04 ENCOUNTER — Ambulatory Visit (INDEPENDENT_AMBULATORY_CARE_PROVIDER_SITE_OTHER): Admitting: Physician Assistant

## 2024-06-04 ENCOUNTER — Encounter: Payer: Self-pay | Admitting: Physician Assistant

## 2024-06-04 VITALS — BP 120/72 | HR 50 | Temp 98.4°F | Resp 18 | Ht 61.0 in | Wt 191.0 lb

## 2024-06-04 DIAGNOSIS — R7303 Prediabetes: Secondary | ICD-10-CM

## 2024-06-04 DIAGNOSIS — E6609 Other obesity due to excess calories: Secondary | ICD-10-CM | POA: Diagnosis not present

## 2024-06-04 DIAGNOSIS — Z6836 Body mass index (BMI) 36.0-36.9, adult: Secondary | ICD-10-CM | POA: Diagnosis not present

## 2024-06-04 DIAGNOSIS — E559 Vitamin D deficiency, unspecified: Secondary | ICD-10-CM

## 2024-06-04 DIAGNOSIS — E66812 Obesity, class 2: Secondary | ICD-10-CM | POA: Diagnosis not present

## 2024-06-04 DIAGNOSIS — R519 Headache, unspecified: Secondary | ICD-10-CM

## 2024-06-04 DIAGNOSIS — Z23 Encounter for immunization: Secondary | ICD-10-CM | POA: Diagnosis not present

## 2024-06-04 DIAGNOSIS — M958 Other specified acquired deformities of musculoskeletal system: Secondary | ICD-10-CM | POA: Diagnosis not present

## 2024-06-04 DIAGNOSIS — I480 Paroxysmal atrial fibrillation: Secondary | ICD-10-CM

## 2024-06-04 DIAGNOSIS — S8011XA Contusion of right lower leg, initial encounter: Secondary | ICD-10-CM

## 2024-06-04 DIAGNOSIS — E782 Mixed hyperlipidemia: Secondary | ICD-10-CM

## 2024-06-04 NOTE — Progress Notes (Signed)
 Established Patient Office Visit  Subjective:  Patient ID: Tonya Graves, female    DOB: September 20, 1950  Age: 73 y.o. MRN: [id number]  CC:  Chief Complaint  Patient presents with   Medical Management of Chronic Issues   Leg Pain    RIGHT    HPI Tonya Graves presents for chronic follow up  Pt with history of hyperlipidemia.  Pt is on Repatha  and due for follow up - is trying to watch diet  Pt with history of GERD - stable on Protonix  40mg  qd  Pt with prediabetes - due for labwork - is trying to watch diet  Pt with history of vit D def - taking weekly supplement - due to recheck lab  Pt with history of PAF - currently follows with cardiology and stable on Eliquis  5mg  qd She recently had appt with Dr Cindie and follows with cardiology yearly  Pt with OA of  knee and chronic low back pain - uses flexeril  as needed  She does state she hit her right lower leg on car door about 2 weeks ago- got bruising and swelling - states is doing better but still tender Not having any issues walking  Pt complains over the past few months of intermittent sharp pain in top of her head. Says it lasts several minutes.  Denies vision issues and no other headaches No facial paresthesias  Pt complains of back pain - does have scoliosis and has noted recently her right shoulder and back seem more pronounced and has generalized tenderness of mid back area  Pt would like flu shot today and    Past Medical History:  Diagnosis Date   Arthritis    OA   Atrial fibrillation (HCC)    Bronchitis    Cataracts, bilateral    Dysrhythmia    history Vtac   GERD (gastroesophageal reflux disease)    Heart palpitations 2016 august   High cholesterol    History of hiatal hernia    Osteopenia    Prediabetes    Stroke [facility])    Urine frequency    UTI (urinary tract infection)    Varicose veins    Ventricular tachycardia (paroxysmal) (HCC) 08/2019   30 day monitor.    Past Surgical History:   Procedure Laterality Date    C SECTIONS     X 2   MOUTH SURGERY  2013   TENDON REPAIR Right  YEARS AGO   LITTLE FINGER   TOTAL KNEE ARTHROPLASTY Right 07/13/2015   Procedure: RIGHT TOTAL KNEE ARTHROPLASTY;  Surgeon: Dempsey Moan, MD;  Location: [facility];  Service: Orthopedics;  Laterality: Right;   TOTAL KNEE ARTHROPLASTY Left 06/06/2016   Procedure: LEFT TOTAL KNEE ARTHROPLASTY;  Surgeon: Dempsey Moan, MD;  Location: [facility];  Service: Orthopedics;  Laterality: Left;   XI ROBOTIC ASSISTED HIATAL HERNIA REPAIR N/A 01/03/2022   Procedure: XI ROBOTIC TAKEDOWN HIATAL HERNIA with fundoplication;  Surgeon: Gladis Cough, MD;  Location: [facility];  Service: General;  Laterality: N/A;    Family History  Problem Relation Age of Onset   High blood pressure Mother    Atrial fibrillation Mother    Stroke Father    Heart disease Father    High Cholesterol Father    Atrial fibrillation Sister    Cancer Maternal Grandmother    Leukemia Maternal Grandmother    Other Maternal Grandfather        [place] spotted fever   Stroke Paternal Grandmother  Social History   Socioeconomic History   Marital status: Married    Spouse name: Not on file   Number of children: Not on file   Years of education: Not on file   Highest education level: Not on file  Occupational History   Not on file  Tobacco Use   Smoking status: Never   Smokeless tobacco: Never  Vaping Use   Vaping status: Never Used  Substance and Sexual Activity   Alcohol use: No   Drug use: No   Sexual activity: Not Currently    Partners: Male    Comment: wodowed  Other Topics Concern   Not on file  Social History Narrative   Right handed   Caffeine 1 every week   One story home with husband   Social Drivers of Corporate investment banker Strain: Low Risk  (11/30/2023)   Overall Financial Resource Strain (CARDIA)    Difficulty of Paying Living Expenses: Not hard at all  Food Insecurity: No Food Insecurity (11/30/2023)    Hunger Vital Sign    Worried About Running Out of Food in the Last Year: Never true    Ran Out of Food in the Last Year: Never true  Transportation Needs: No Transportation Needs (11/30/2023)   PRAPARE - Administrator, Civil Service (Medical): No    Lack of Transportation (Non-Medical): No  Physical Activity: Inactive (11/30/2023)   Exercise Vital Sign    Days of Exercise per Week: 0 days    Minutes of Exercise per Session: 0 min  Stress: No Stress Concern Present (11/30/2023)   [employer] of Occupational Health - Occupational Stress Questionnaire    Feeling of Stress : Not at all  Social Connections: Socially Integrated (11/30/2023)   Social Connection and Isolation Panel    Frequency of Communication with Friends and Family: More than three times a week    Frequency of Social Gatherings with Friends and Family: More than three times a week    Attends Religious Services: More than 4 times per year    Active Member of [employer] or Organizations: Yes    Attends Banker Meetings: 1 to 4 times per year    Marital Status: Married  Catering manager Violence: Not At Risk (11/30/2023)   Humiliation, Afraid, Rape, and Kick questionnaire    Fear of Current or Ex-Partner: No    Emotionally Abused: No    Physically Abused: No    Sexually Abused: No     Current Outpatient Medications:    Calcium  Carbonate-Vitamin D  (CALCIUM  500 + D PO), Take 1 tablet by mouth 2 (two) times daily., Disp: , Rfl:    cyclobenzaprine  (FLEXERIL ) 5 MG tablet, Take 1 tablet (5 mg total) by mouth at bedtime., Disp: 30 tablet, Rfl: 1   dicyclomine (BENTYL) 20 MG tablet, Take 10 mg by mouth every 6 (six) hours as needed., Disp: , Rfl:    ELIQUIS  5 MG TABS tablet, TAKE 1 TABLET BY MOUTH TWICE A DAY, Disp: 180 tablet, Rfl: 1   Evolocumab  (REPATHA  SURECLICK) 140 MG/ML SOAJ, Inject 140 mg into the skin every 14 (fourteen) days., Disp: 6 mL, Rfl: 3   famotidine (PEPCID) 40 MG tablet, Take 40 mg by  mouth daily., Disp: , Rfl:    furosemide  (LASIX ) 20 MG tablet, Take 1 tablet (20 mg total) by mouth daily as needed for edema., Disp: 90 tablet, Rfl: 1   hydrocortisone  (PROCTOSOL HC) 2.5 % rectal cream, Place 1 Application rectally  2 (two) times daily., Disp: 30 g, Rfl: 0   loratadine (CLARITIN) 10 MG tablet, Take 10 mg by mouth at bedtime., Disp: , Rfl:    metoprolol  tartrate (LOPRESSOR ) 25 MG tablet, Take 25 mg by mouth 2 (two) times daily., Disp: , Rfl:    nystatin cream (MYCOSTATIN), APPLY TO THE AFFECTED AREA(S) BY TOPICAL ROUTE 2 TIMES PER DAY, Disp: , Rfl:    pantoprazole  (PROTONIX ) 40 MG tablet, TAKE 1 TABLET BY MOUTH EVERY DAY, Disp: 90 tablet, Rfl: 1   potassium chloride  (KLOR-CON  M) 10 MEQ tablet, Take 1 tablet (10 mEq total) by mouth daily. When taking furosemide , Disp: 90 tablet, Rfl: 1   Propylene Glycol (SYSTANE COMPLETE OP), Place 1 drop into both eyes daily., Disp: , Rfl:    triamcinolone  cream (KENALOG ) 0.1 %, APPLY A THIN LAYER TO THE AFFECTED AREA(S) BY TOPICAL ROUTE 2 TIMES PER DAY, Disp: , Rfl:    Vitamin D , Ergocalciferol , (DRISDOL ) 1.25 MG (50000 UNIT) CAPS capsule, TAKE 1 CAPSULE BY MOUTH ONE TIME PER WEEK, Disp: 12 capsule, Rfl: 3   Wheat Dextrin (BENEFIBER) POWD, Take 1 Scoop by mouth at bedtime., Disp: , Rfl:    No Known Allergies CONSTITUTIONAL: Negative for chills, fatigue, fever, unintentional weight gain and unintentional weight loss.  E/N/T: Negative for ear pain, nasal congestion and sore throat.  CARDIOVASCULAR: Negative for chest pain, dizziness, palpitations and pedal edema.  RESPIRATORY: Negative for recent cough and dyspnea.  GASTROINTESTINAL: Negative for abdominal pain, acid reflux symptoms, constipation, diarrhea, nausea and vomiting.  MSK: see HPI INTEGUMENTARY: Negative for rash.  NEUROLOGICAL: Negative for dizziness and headaches.  PSYCHIATRIC: Negative for sleep disturbance and to question depression screen.  Negative for depression, negative for  anhedonia.         Objective:  PHYSICAL EXAM:   VS: BP 120/72   Pulse (!) 50   Temp 98.4 F (36.9 C) (Temporal)   Resp 18   Ht 5' 1 (1.549 m)   Wt 191 lb (86.6 kg)   SpO2 98%   BMI 36.09 kg/m   GEN: Well nourished, well developed, in no acute distress   Cardiac: RRR; no murmurs, rubs, or gallops,no edema -  Respiratory:  normal respiratory rate and pattern with no distress - normal breath sounds with no rales, rhonchi, wheezes or rubs GI: normal bowel sounds, no masses or tenderness MS: right lower leg with hematoma noted --- back with right scapular winging noted Skin: warm and dry, no rash  Neuro:  Alert and Oriented x 3, - CN II-Xii grossly intact Psych: euthymic mood, appropriate affect and demeanor   There are no preventive care reminders to display for this patient.    There are no preventive care reminders to display for this patient.  Lab Results  Component Value Date   TSH 1.880 11/30/2023   Lab Results  Component Value Date   WBC 4.0 11/30/2023   HGB 11.9 11/30/2023   HCT 36.7 11/30/2023   MCV 85 11/30/2023   PLT 251 11/30/2023   Lab Results  Component Value Date   NA 142 11/30/2023   K 4.7 11/30/2023   CO2 24 11/30/2023   GLUCOSE 89 11/30/2023   BUN 20 11/30/2023   CREATININE 1.01 (H) 11/30/2023   BILITOT 0.2 11/30/2023   ALKPHOS 72 11/30/2023   AST 23 11/30/2023   ALT 13 11/30/2023   PROT 6.7 11/30/2023   ALBUMIN 3.9 11/30/2023   CALCIUM  9.3 11/30/2023   ANIONGAP 5 01/05/2022   EGFR  60 11/30/2023   Lab Results  Component Value Date   CHOL 181 11/30/2023   Lab Results  Component Value Date   HDL 111 11/30/2023   Lab Results  Component Value Date   LDLCALC 60 11/30/2023   Lab Results  Component Value Date   TRIG 50 11/30/2023   Lab Results  Component Value Date   CHOLHDL 1.6 11/30/2023   Lab Results  Component Value Date   HGBA1C 6.0 (H) 11/30/2023      Assessment & Plan:   Problem List Items Addressed This Visit        Cardiovascular and Mediastinum   PAF (paroxysmal atrial fibrillation) (HCC)   Relevant Orders   CBC with Differential/Platelet   Comprehensive metabolic panel   TSH Continue med Follow up with cardiology     Other   Elevated coronart artery score   Relevant Orders   Comprehensive metabolic panel Lipid panel pending Continue repatha  Follow up with Dr Court   Vitamin D  insufficiency   Relevant Orders   VITAMIN D  25 Hydroxy (Vit-D Deficiency, Fractures)   Other Visit Diagnoses     Prediabetes       Relevant Orders   Hemoglobin A1c Watch diet      GERD -  Stable on protonix   Hyperlipidemia Continue meds - watch diet  Need flu vaccine Fluzone Hi dose given  Hematoma right leg Elevate as needed  New onset headache CT head  Winged scapula right side Scoliosis xrays ordered Pt to consider PT  Class 2 severe obesity with BMI 36 with comorbidity - hyperlipidemia Efforts at weight loss Continue Repatha                    No orders of the defined types were placed in this encounter.   Follow-up: Return in about 6 months (around 12/02/2024) for chronic fasting follow-up.    SARA R Raevon Broom, PA-C

## 2024-06-05 LAB — CBC WITH DIFFERENTIAL/PLATELET
Basophils Absolute: 0 x10E3/uL (ref 0.0–0.2)
Basos: 1 %
EOS (ABSOLUTE): 0.1 x10E3/uL (ref 0.0–0.4)
Eos: 2 %
Hematocrit: 37.5 % (ref 34.0–46.6)
Hemoglobin: 12.1 g/dL (ref 11.1–15.9)
Immature Grans (Abs): 0 x10E3/uL (ref 0.0–0.1)
Immature Granulocytes: 0 %
Lymphocytes Absolute: 1.2 x10E3/uL (ref 0.7–3.1)
Lymphs: 27 %
MCH: 28.5 pg (ref 26.6–33.0)
MCHC: 32.3 g/dL (ref 31.5–35.7)
MCV: 88 fL (ref 79–97)
Monocytes Absolute: 0.6 x10E3/uL (ref 0.1–0.9)
Monocytes: 13 %
Neutrophils Absolute: 2.7 x10E3/uL (ref 1.4–7.0)
Neutrophils: 57 %
Platelets: 225 x10E3/uL (ref 150–450)
RBC: 4.25 x10E6/uL (ref 3.77–5.28)
RDW: 14 % (ref 11.7–15.4)
WBC: 4.6 x10E3/uL (ref 3.4–10.8)

## 2024-06-05 LAB — LIPID PANEL
Chol/HDL Ratio: 2.1 ratio (ref 0.0–4.4)
Cholesterol, Total: 197 mg/dL (ref 100–199)
HDL: 95 mg/dL (ref >39)
LDL Chol Calc (NIH): 90 mg/dL (ref 0–99)
Triglycerides: 66 mg/dL (ref 0–149)
VLDL Cholesterol Cal: 12 mg/dL (ref 5–40)

## 2024-06-05 LAB — HEMOGLOBIN A1C
Est. average glucose Bld gHb Est-mCnc: 123 mg/dL
Hgb A1c MFr Bld: 5.9 % — ABNORMAL HIGH (ref 4.8–5.6)

## 2024-06-05 LAB — COMPREHENSIVE METABOLIC PANEL WITH GFR
ALT: 109 IU/L — ABNORMAL HIGH (ref 0–32)
AST: 120 IU/L — ABNORMAL HIGH (ref 0–40)
Albumin: 4 g/dL (ref 3.8–4.8)
Alkaline Phosphatase: 80 IU/L (ref 49–135)
BUN/Creatinine Ratio: 22 (ref 12–28)
BUN: 23 mg/dL (ref 8–27)
Bilirubin Total: 0.3 mg/dL (ref 0.0–1.2)
CO2: 24 mmol/L (ref 20–29)
Calcium: 9.2 mg/dL (ref 8.7–10.3)
Chloride: 103 mmol/L (ref 96–106)
Creatinine, Ser: 1.03 mg/dL — ABNORMAL HIGH (ref 0.57–1.00)
Globulin, Total: 3 g/dL (ref 1.5–4.5)
Glucose: 92 mg/dL (ref 70–99)
Potassium: 5.2 mmol/L (ref 3.5–5.2)
Sodium: 140 mmol/L (ref 134–144)
Total Protein: 7 g/dL (ref 6.0–8.5)
eGFR: 58 mL/min/1.73 — ABNORMAL LOW (ref >59)

## 2024-06-05 LAB — VITAMIN D 25 HYDROXY (VIT D DEFICIENCY, FRACTURES): Vit D, 25-Hydroxy: 110 ng/mL — ABNORMAL HIGH (ref 30.0–100.0)

## 2024-06-05 LAB — TSH: TSH: 1.63 u[IU]/mL (ref 0.450–4.500)

## 2024-06-06 ENCOUNTER — Ambulatory Visit

## 2024-06-06 ENCOUNTER — Other Ambulatory Visit

## 2024-06-06 ENCOUNTER — Other Ambulatory Visit: Payer: Self-pay | Admitting: Physician Assistant

## 2024-06-06 ENCOUNTER — Ambulatory Visit: Payer: Self-pay | Admitting: Physician Assistant

## 2024-06-06 DIAGNOSIS — R899 Unspecified abnormal finding in specimens from other organs, systems and tissues: Secondary | ICD-10-CM | POA: Diagnosis not present

## 2024-06-06 DIAGNOSIS — I480 Paroxysmal atrial fibrillation: Secondary | ICD-10-CM

## 2024-06-06 LAB — CUP PACEART REMOTE DEVICE CHECK
Date Time Interrogation Session: 20250930232137
Implantable Pulse Generator Implant Date: 20220214

## 2024-06-06 NOTE — Progress Notes (Signed)
 Remote Loop Recorder Transmission

## 2024-06-07 ENCOUNTER — Ambulatory Visit (INDEPENDENT_AMBULATORY_CARE_PROVIDER_SITE_OTHER)
Admission: RE | Admit: 2024-06-07 | Discharge: 2024-06-07 | Disposition: A | Discharge status: Home / Self Care | Source: Ambulatory Visit | Attending: Physician Assistant | Admitting: Physician Assistant

## 2024-06-07 ENCOUNTER — Ambulatory Visit: Payer: Self-pay | Admitting: Cardiovascular Disease

## 2024-06-07 ENCOUNTER — Ambulatory Visit: Admitting: Physician Assistant

## 2024-06-07 ENCOUNTER — Ambulatory Visit: Payer: Self-pay | Admitting: Physician Assistant

## 2024-06-07 ENCOUNTER — Other Ambulatory Visit: Payer: Self-pay | Admitting: Physician Assistant

## 2024-06-07 DIAGNOSIS — E559 Vitamin D deficiency, unspecified: Secondary | ICD-10-CM

## 2024-06-07 DIAGNOSIS — R519 Headache, unspecified: Secondary | ICD-10-CM | POA: Diagnosis not present

## 2024-06-07 DIAGNOSIS — M4184 Other forms of scoliosis, thoracic region: Secondary | ICD-10-CM | POA: Diagnosis not present

## 2024-06-07 DIAGNOSIS — S8011XA Contusion of right lower leg, initial encounter: Secondary | ICD-10-CM

## 2024-06-07 DIAGNOSIS — E782 Mixed hyperlipidemia: Secondary | ICD-10-CM

## 2024-06-07 DIAGNOSIS — Z23 Encounter for immunization: Secondary | ICD-10-CM

## 2024-06-07 DIAGNOSIS — M958 Other specified acquired deformities of musculoskeletal system: Secondary | ICD-10-CM

## 2024-06-07 DIAGNOSIS — I480 Paroxysmal atrial fibrillation: Secondary | ICD-10-CM

## 2024-06-07 DIAGNOSIS — R7303 Prediabetes: Secondary | ICD-10-CM

## 2024-06-07 DIAGNOSIS — E66812 Obesity, class 2: Secondary | ICD-10-CM

## 2024-06-07 LAB — HEPATIC FUNCTION PANEL
ALT: 106 IU/L — ABNORMAL HIGH (ref 0–32)
AST: 110 IU/L — ABNORMAL HIGH (ref 0–40)
Albumin: 3.8 g/dL (ref 3.8–4.8)
Alkaline Phosphatase: 83 IU/L (ref 49–135)
Bilirubin Total: <0.2 mg/dL (ref 0.0–1.2)
Bilirubin, Direct: 0.1 mg/dL (ref 0.00–0.40)
Total Protein: 6.7 g/dL (ref 6.0–8.5)

## 2024-06-07 LAB — ACUTE VIRAL HEPATITIS (HAV, HBV, HCV)
HCV Ab: NONREACTIVE
Hep A IgM: NEGATIVE
Hep B C IgM: NEGATIVE
Hepatitis B Surface Ag: NEGATIVE

## 2024-06-07 LAB — HCV INTERPRETATION

## 2024-06-10 ENCOUNTER — Ambulatory Visit (HOSPITAL_BASED_OUTPATIENT_CLINIC_OR_DEPARTMENT_OTHER)
Admission: RE | Admit: 2024-06-10 | Discharge: 2024-06-10 | Disposition: A | Source: Ambulatory Visit | Attending: Physician Assistant | Admitting: Physician Assistant

## 2024-06-10 ENCOUNTER — Encounter: Payer: Self-pay | Admitting: Physician Assistant

## 2024-06-10 ENCOUNTER — Ambulatory Visit: Admitting: Physician Assistant

## 2024-06-10 ENCOUNTER — Telehealth: Payer: Self-pay

## 2024-06-10 ENCOUNTER — Other Ambulatory Visit: Payer: Self-pay | Admitting: Physician Assistant

## 2024-06-10 VITALS — BP 120/60 | HR 61 | Temp 98.0°F | Resp 18 | Ht 61.0 in | Wt 194.6 lb

## 2024-06-10 DIAGNOSIS — M25552 Pain in left hip: Secondary | ICD-10-CM

## 2024-06-10 DIAGNOSIS — M25551 Pain in right hip: Secondary | ICD-10-CM | POA: Diagnosis not present

## 2024-06-10 MED ORDER — TRAMADOL HCL 50 MG PO TABS: 50.0000 mg | ORAL_TABLET | Freq: Three times a day (TID) | ORAL | 0 refills | Status: AC | PRN

## 2024-06-10 NOTE — Telephone Encounter (Signed)
 Called patient and made appointment for labs on Thursday. Also patient stated that she feel on Saturday and was down for 5 hours and she is having pain in her hips. Appointment made this afternoon with provider.   Copied from CRM [id number]. Topic: Clinical - Request for Lab/Test Order >> Jun 10, 2024  8:17 AM Avram MATSU wrote: Reason for CRM: Pt I calling to get an appt for labs, she was told she needed to repeat it. No order int he chart at this time. Please advise [phone] (H) leave vm is okay

## 2024-06-10 NOTE — Progress Notes (Signed)
 Acute Office Visit  Subjective:    Patient ID: Tonya Graves, female    DOB: 25-Jan-1951, 73 y.o.   MRN: [id number]  Chief Complaint  Patient presents with   Hip Pain    Right    HPI: Patient is in today for complaints of bilateral hip pain but mostly on right side On Saturday her foot slipped off a step in her garage causing her to fall on her right side hitting her right hip and elbow.  She states she was weak and not able to get off floor for almost 5 hours until a family member came and helped her get up.  She states she scooted around on her bottom and knees and thinks that is why her left hip is hurting also.  She is bruised on her right hip and elbow.  She does have pain with certain movements and with sitting.  She is able to bear weight and walk   Current Outpatient Medications:    Calcium  Carbonate-Vitamin D  (CALCIUM  500 + D PO), Take 1 tablet by mouth 2 (two) times daily., Disp: , Rfl:    cyclobenzaprine  (FLEXERIL ) 5 MG tablet, Take 1 tablet (5 mg total) by mouth at bedtime., Disp: 30 tablet, Rfl: 1   dicyclomine (BENTYL) 20 MG tablet, Take 10 mg by mouth every 6 (six) hours as needed., Disp: , Rfl:    ELIQUIS  5 MG TABS tablet, TAKE 1 TABLET BY MOUTH TWICE A DAY, Disp: 180 tablet, Rfl: 1   Evolocumab  (REPATHA  SURECLICK) 140 MG/ML SOAJ, Inject 140 mg into the skin every 14 (fourteen) days., Disp: 6 mL, Rfl: 3   famotidine (PEPCID) 40 MG tablet, Take 40 mg by mouth daily., Disp: , Rfl:    furosemide  (LASIX ) 20 MG tablet, Take 1 tablet (20 mg total) by mouth daily as needed for edema., Disp: 90 tablet, Rfl: 1   hydrocortisone  (PROCTOSOL HC) 2.5 % rectal cream, Place 1 Application rectally 2 (two) times daily., Disp: 30 g, Rfl: 0   loratadine (CLARITIN) 10 MG tablet, Take 10 mg by mouth at bedtime., Disp: , Rfl:    metoprolol  tartrate (LOPRESSOR ) 25 MG tablet, Take 25 mg by mouth 2 (two) times daily., Disp: , Rfl:    nystatin cream (MYCOSTATIN), APPLY TO THE AFFECTED AREA(S) BY  TOPICAL ROUTE 2 TIMES PER DAY, Disp: , Rfl:    pantoprazole  (PROTONIX ) 40 MG tablet, TAKE 1 TABLET BY MOUTH EVERY DAY, Disp: 90 tablet, Rfl: 1   potassium chloride  (KLOR-CON  M) 10 MEQ tablet, Take 1 tablet (10 mEq total) by mouth daily. When taking furosemide , Disp: 90 tablet, Rfl: 1   Propylene Glycol (SYSTANE COMPLETE OP), Place 1 drop into both eyes daily., Disp: , Rfl:    traMADol  (ULTRAM ) 50 MG tablet, Take 1 tablet (50 mg total) by mouth every 8 (eight) hours as needed for up to 5 days., Disp: 15 tablet, Rfl: 0   Wheat Dextrin (BENEFIBER) POWD, Take 1 Scoop by mouth at bedtime., Disp: , Rfl:    triamcinolone  cream (KENALOG ) 0.1 %, APPLY A THIN LAYER TO THE AFFECTED AREA(S) BY TOPICAL ROUTE 2 TIMES PER DAY, Disp: , Rfl:    Vitamin D , Ergocalciferol , (DRISDOL ) 1.25 MG (50000 UNIT) CAPS capsule, TAKE 1 CAPSULE BY MOUTH ONE TIME PER WEEK, Disp: 12 capsule, Rfl: 3  No Known Allergies  ROS CONSTITUTIONAL: Negative for chills, fatigue, fever,  CARDIOVASCULAR: Negative for chest pain, dizziness, palpitations and pedal edema.  RESPIRATORY: Negative for recent cough and dyspnea.  MSK: see HPI INTEGUMENTARY: Negative for rash.       Objective:    PHYSICAL EXAM:   BP 120/60   Pulse 61   Temp 98 F (36.7 C) (Temporal)   Resp 18   Ht 5' 1 (1.549 m)   Wt 194 lb 9.6 oz (88.3 kg)   SpO2 98%   BMI 36.77 kg/m    GEN: Well nourished, well developed, in no acute distress   Cardiac: RRR; no murmurs,  Respiratory:  normal respiratory rate and pattern with no distress - normal breath sounds with no rales, rhonchi, wheezes or rubs GI: normal bowel sounds, no masses or tenderness MS: no deformity or atrophy - bruising noted of right hip and tender to palpation - left hip also tender to palpation - has good rom of lower extremities Skin: warm and dry, no rash       Assessment & Plan:    Bilateral hip pain -     DG HIP UNILAT W OR W/O PELVIS 2-3 VIEWS RIGHT; Future -     DG HIP UNILAT  W OR W/O PELVIS 2-3 VIEWS LEFT; Future -     traMADol  HCl; Take 1 tablet (50 mg total) by mouth every 8 (eight) hours as needed for up to 5 days.  Dispense: 15 tablet; Refill: 0     Follow-up: Return for as scheduled for next follow up appt.  An After Visit Summary was printed and given to the patient.  CAMIE JONELLE NICHOLAUS DEVONNA [facility] [phone]

## 2024-06-11 ENCOUNTER — Ambulatory Visit: Payer: Self-pay | Admitting: Physician Assistant

## 2024-06-11 DIAGNOSIS — M958 Other specified acquired deformities of musculoskeletal system: Secondary | ICD-10-CM

## 2024-06-11 DIAGNOSIS — M4125 Other idiopathic scoliosis, thoracolumbar region: Secondary | ICD-10-CM

## 2024-06-12 ENCOUNTER — Other Ambulatory Visit: Payer: Self-pay | Admitting: Physician Assistant

## 2024-06-12 DIAGNOSIS — R748 Abnormal levels of other serum enzymes: Secondary | ICD-10-CM

## 2024-06-13 ENCOUNTER — Other Ambulatory Visit

## 2024-06-13 DIAGNOSIS — R748 Abnormal levels of other serum enzymes: Secondary | ICD-10-CM

## 2024-06-14 ENCOUNTER — Ambulatory Visit: Payer: Self-pay | Admitting: Physician Assistant

## 2024-06-14 ENCOUNTER — Other Ambulatory Visit: Payer: Self-pay | Admitting: Physician Assistant

## 2024-06-14 ENCOUNTER — Ambulatory Visit: Payer: Self-pay

## 2024-06-14 DIAGNOSIS — R748 Abnormal levels of other serum enzymes: Secondary | ICD-10-CM

## 2024-06-14 LAB — HEPATIC FUNCTION PANEL
ALT: 105 IU/L — ABNORMAL HIGH (ref 0–32)
AST: 110 IU/L — ABNORMAL HIGH (ref 0–40)
Albumin: 3.8 g/dL (ref 3.8–4.8)
Alkaline Phosphatase: 73 IU/L (ref 49–135)
Bilirubin Total: 0.3 mg/dL (ref 0.0–1.2)
Bilirubin, Direct: 0.14 mg/dL (ref 0.00–0.40)
Total Protein: 6.6 g/dL (ref 6.0–8.5)

## 2024-06-14 NOTE — Telephone Encounter (Signed)
 FYI Only or Action Required?: FYI only for provider.  Patient was last seen in primary care on 06/10/2024 by Nicholaus Credit, PA-C.  Called Nurse Triage reporting Advice Only.  Symptoms began information only call.  Interventions attempted: Other: information only call.  Symptoms are: information only call.  Triage Disposition: Information or Advice Only Call  Patient/caregiver understands and will follow disposition?: Yes                             Copied from CRM #[id number]. Topic: Clinical - Lab/Test Results >> Jun 14, 2024  9:44 AM Tonya Graves wrote: Reason for CRM: Patient is returning a missed call regarding lab results. Read verbatim noteBETHA Ranee Alix Graves, CMA    06/14/24  9:01 AM Result Note Patient called.  Left message for patient to call back. Reason for Disposition  Health information question, no triage required and triager able to answer question  Answer Assessment - Initial Assessment Questions 1. REASON FOR CALL: What is the main reason for your call? or How can I best help you?     Patient returning call from office regarding lab results. This RN read result note, Liver enzymes still elevated - recommend to follow up with Dr Larene as directed. Repeat hepatic panel in 3 weeks to patient. Patient stated she has an appointment with Dr. Larene on 07/01/24. Lab orders have been placed by provider. This RN offered to schedule lab appointment for patient. Patient stated she is supposed to have labs drawn at the hospital next week and will call back after that to schedule. Patient has no additional questions at this time.  Protocols used: Information Only Call - No Triage-A-AH

## 2024-06-21 ENCOUNTER — Ambulatory Visit: Payer: PPO

## 2024-06-21 DIAGNOSIS — D649 Anemia, unspecified: Secondary | ICD-10-CM | POA: Diagnosis not present

## 2024-06-21 DIAGNOSIS — R935 Abnormal findings on diagnostic imaging of other abdominal regions, including retroperitoneum: Secondary | ICD-10-CM | POA: Diagnosis not present

## 2024-06-21 DIAGNOSIS — R945 Abnormal results of liver function studies: Secondary | ICD-10-CM | POA: Diagnosis not present

## 2024-07-01 DIAGNOSIS — K219 Gastro-esophageal reflux disease without esophagitis: Secondary | ICD-10-CM | POA: Diagnosis not present

## 2024-07-08 ENCOUNTER — Ambulatory Visit

## 2024-07-08 DIAGNOSIS — I480 Paroxysmal atrial fibrillation: Secondary | ICD-10-CM

## 2024-07-08 LAB — CUP PACEART REMOTE DEVICE CHECK
Date Time Interrogation Session: 20251102231256
Implantable Pulse Generator Implant Date: 20220214

## 2024-07-10 NOTE — Progress Notes (Signed)
 Remote Loop Recorder Transmission

## 2024-07-11 ENCOUNTER — Ambulatory Visit: Payer: Self-pay | Admitting: Cardiovascular Disease

## 2024-07-23 DIAGNOSIS — R748 Abnormal levels of other serum enzymes: Secondary | ICD-10-CM | POA: Diagnosis not present

## 2024-07-26 ENCOUNTER — Ambulatory Visit: Payer: PPO

## 2024-08-05 DIAGNOSIS — M546 Pain in thoracic spine: Secondary | ICD-10-CM | POA: Diagnosis not present

## 2024-08-05 DIAGNOSIS — G8929 Other chronic pain: Secondary | ICD-10-CM | POA: Diagnosis not present

## 2024-08-08 ENCOUNTER — Ambulatory Visit: Payer: Self-pay | Admitting: Cardiovascular Disease

## 2024-08-08 ENCOUNTER — Ambulatory Visit

## 2024-08-08 DIAGNOSIS — I480 Paroxysmal atrial fibrillation: Secondary | ICD-10-CM | POA: Diagnosis not present

## 2024-08-08 LAB — CUP PACEART REMOTE DEVICE CHECK
Date Time Interrogation Session: 20251203231310
Implantable Pulse Generator Implant Date: 20220214

## 2024-08-12 ENCOUNTER — Other Ambulatory Visit: Payer: Self-pay | Admitting: Physician Assistant

## 2024-08-12 DIAGNOSIS — I4729 Other ventricular tachycardia: Secondary | ICD-10-CM

## 2024-08-12 DIAGNOSIS — M546 Pain in thoracic spine: Secondary | ICD-10-CM | POA: Diagnosis not present

## 2024-08-12 DIAGNOSIS — G8929 Other chronic pain: Secondary | ICD-10-CM | POA: Diagnosis not present

## 2024-08-14 NOTE — Progress Notes (Signed)
 Remote Loop Recorder Transmission

## 2024-08-19 NOTE — Progress Notes (Unsigned)
°  Electrophysiology Office Follow up Visit Note:    Date:  08/22/2024   ID:  Tonya Graves, DOB October 22, 1950, MRN [id number]  PCP:  Nicholaus Credit, PA-C  [facility] Cardiologist:  Dorn Lesches, MD  [facility] Electrophysiologist:  OLE ONEIDA HOLTS, MD    Interval History:     Tonya Graves is a 73 y.o. female who presents for a follow up visit.   I last saw the patient April 06, 2023.  The patient has a history of atrial fibrillation on Eliquis . She is doing well today.  She reports 2 lightheadedness episodes while in the kitchen standing.  No loss of consciousness.  They lasted for just a couple of seconds.       Past medical, surgical, social and family history were reviewed.  ROS:   Please see the history of present illness.    All other systems reviewed and are negative.  EKGs/Labs/Other Studies Reviewed:    The following studies were reviewed today:          Physical Exam:    VS:  BP (!) 140/70 (BP Location: Right Arm, Patient Position: Sitting, Cuff Size: Large)   Pulse (!) 54   Ht 5' 1 (1.549 m)   Wt 189 lb (85.7 kg)   SpO2 99%   BMI 35.71 kg/m     Wt Readings from Last 3 Encounters:  08/22/24 189 lb (85.7 kg)  06/10/24 194 lb 9.6 oz (88.3 kg)  06/04/24 191 lb (86.6 kg)     GEN: no distress CARD: RRR, No MRG RESP: No IWOB. CTAB.      ASSESSMENT:    1. PAF (paroxysmal atrial fibrillation) (HCC)    PLAN:    In order of problems listed above:  #Paroxysmal atrial fibrillation Continue Eliquis  for secondary stroke prophylaxis Low burden of atrial fibrillation on loop recorder monitoring I will turn on pause and bradycardia detections on her loop recorder and give her a symptom activator.  I discussed my upcoming departure from [facility] during today's clinic appointment.  The patient will continue to follow-up with one of my EP partners moving forward.  Follow-up 1 year with EP APP  Signed, Ole Holts, MD, [facility],  [facility] 08/22/2024 12:06 PM    Electrophysiology [facility]

## 2024-08-22 ENCOUNTER — Ambulatory Visit: Attending: Cardiovascular Disease | Admitting: Cardiology

## 2024-08-22 ENCOUNTER — Encounter: Payer: Self-pay | Admitting: Cardiology

## 2024-08-22 VITALS — BP 140/70 | HR 54 | Ht 61.0 in | Wt 189.0 lb

## 2024-08-22 DIAGNOSIS — I480 Paroxysmal atrial fibrillation: Secondary | ICD-10-CM

## 2024-08-22 LAB — CUP PACEART INCLINIC DEVICE CHECK
Date Time Interrogation Session: 20251218122319
Implantable Pulse Generator Implant Date: 20220214

## 2024-08-22 NOTE — Patient Instructions (Signed)

## 2024-08-30 ENCOUNTER — Ambulatory Visit (INDEPENDENT_AMBULATORY_CARE_PROVIDER_SITE_OTHER): Admitting: Physician Assistant

## 2024-08-30 ENCOUNTER — Ambulatory Visit: Payer: PPO

## 2024-08-30 ENCOUNTER — Encounter: Payer: Self-pay | Admitting: Physician Assistant

## 2024-08-30 VITALS — BP 138/72 | HR 56 | Temp 97.8°F | Ht 61.0 in | Wt 192.9 lb

## 2024-08-30 DIAGNOSIS — S99929A Unspecified injury of unspecified foot, initial encounter: Secondary | ICD-10-CM | POA: Diagnosis not present

## 2024-08-30 MED ORDER — CYCLOBENZAPRINE HCL 5 MG PO TABS: 5.0000 mg | ORAL_TABLET | Freq: Every day | ORAL | 0 refills | Status: AC

## 2024-08-30 NOTE — Progress Notes (Signed)
 "  Acute Office Visit  Subjective:    Patient ID: Tonya Graves, female    DOB: 07/06/1951, 73 y.o.   MRN: [id number]  Chief Complaint  Patient presents with   Left middle toe sore    HPI: Patient is in today for toe pain and drainage.   Discussed the use of AI scribe software for clinical note transcription with the patient, who gave verbal consent to proceed.  History of Present Illness Tonya Graves is a 73 year old female who presents with severe toe pain following trauma.  The toe pain began about a week ago, initially thought to be due to an ingrown toenail. She treated it with Epsom salt soaks, antibiotic ointment, and a Band-Aid, which seemed to improve the condition.  On Tuesday, she accidentally dropped a chair leg on the toe, exacerbating the pain. The pain is severe when touched on top, but she can move the toe without pain. There is no current drainage, although there was some pus initially, and she noticed bleeding after removing the Band-Aid.  The chair leg hit across the nail, and wearing shoes causes scraping on the top of the toe, worsening the pain. No warmth or excessive drainage from the toe. She can walk without significant pain, although the shoe scraping causes discomfort.  She is currently using a Band-Aid and antibiotic ointment for the toe. She has been soaking it in Epsom salt, which does not cause pain but may contribute to swelling.      Past Medical History:  Diagnosis Date   Arthritis    OA   Atrial fibrillation (HCC)    Bronchitis    Cataracts, bilateral    Dysrhythmia    history Vtac   GERD (gastroesophageal reflux disease)    Heart palpitations 2016 august   High cholesterol    History of hiatal hernia    Osteopenia    Prediabetes    Stroke [facility])    Urine frequency    UTI (urinary tract infection)    Varicose veins    Ventricular tachycardia (paroxysmal) (HCC) 08/2019   30 day monitor.    Past Surgical History:  Procedure  Laterality Date    C SECTIONS     X 2   MOUTH SURGERY  2013   TENDON REPAIR Right  YEARS AGO   LITTLE FINGER   TOTAL KNEE ARTHROPLASTY Right 07/13/2015   Procedure: RIGHT TOTAL KNEE ARTHROPLASTY;  Surgeon: Dempsey Moan, MD;  Location: [facility];  Service: Orthopedics;  Laterality: Right;   TOTAL KNEE ARTHROPLASTY Left 06/06/2016   Procedure: LEFT TOTAL KNEE ARTHROPLASTY;  Surgeon: Dempsey Moan, MD;  Location: [facility];  Service: Orthopedics;  Laterality: Left;   XI ROBOTIC ASSISTED HIATAL HERNIA REPAIR N/A 01/03/2022   Procedure: XI ROBOTIC TAKEDOWN HIATAL HERNIA with fundoplication;  Surgeon: Gladis Cough, MD;  Location: [facility];  Service: General;  Laterality: N/A;    Family History  Problem Relation Age of Onset   High blood pressure Mother    Atrial fibrillation Mother    Stroke Father    Heart disease Father    High Cholesterol Father    Atrial fibrillation Sister    Cancer Maternal Grandmother    Leukemia Maternal Grandmother    Other Maternal Grandfather        [place] spotted fever   Stroke Paternal Grandmother     Social History   Socioeconomic History   Marital status: Married    Spouse name: Not on file  Number of children: Not on file   Years of education: Not on file   Highest education level: Not on file  Occupational History   Not on file  Tobacco Use   Smoking status: Never   Smokeless tobacco: Never  Vaping Use   Vaping status: Never Used  Substance and Sexual Activity   Alcohol use: No   Drug use: No   Sexual activity: Not Currently    Partners: Male    Comment: wodowed  Other Topics Concern   Not on file  Social History Narrative   Right handed   Caffeine 1 every week   One story home with husband   Social Drivers of Health   Tobacco Use: Low Risk (08/30/2024)   Patient History    Smoking Tobacco Use: Never    Smokeless Tobacco Use: Never    Passive Exposure: Not on file  Financial Resource Strain: Low Risk (11/30/2023)   Overall  Financial Resource Strain (CARDIA)    Difficulty of Paying Living Expenses: Not hard at all  Food Insecurity: No Food Insecurity (11/30/2023)   Hunger Vital Sign    Worried About Running Out of Food in the Last Year: Never true    Ran Out of Food in the Last Year: Never true  Transportation Needs: No Transportation Needs (11/30/2023)   PRAPARE - Administrator, Civil Service (Medical): No    Lack of Transportation (Non-Medical): No  Physical Activity: Inactive (11/30/2023)   Exercise Vital Sign    Days of Exercise per Week: 0 days    Minutes of Exercise per Session: 0 min  Stress: No Stress Concern Present (11/30/2023)   [employer] of Occupational Health - Occupational Stress Questionnaire    Feeling of Stress : Not at all  Social Connections: Socially Integrated (11/30/2023)   Social Connection and Isolation Panel    Frequency of Communication with Friends and Family: More than three times a week    Frequency of Social Gatherings with Friends and Family: More than three times a week    Attends Religious Services: More than 4 times per year    Active Member of [employer] or Organizations: Yes    Attends Banker Meetings: 1 to 4 times per year    Marital Status: Married  Catering Manager Violence: Not At Risk (11/30/2023)   Humiliation, Afraid, Rape, and Kick questionnaire    Fear of Current or Ex-Partner: No    Emotionally Abused: No    Physically Abused: No    Sexually Abused: No  Depression (PHQ2-9): Low Risk (06/04/2024)   Depression (PHQ2-9)    PHQ-2 Score: 3  Alcohol Screen: Low Risk (11/30/2023)   Alcohol Screen    Last Alcohol Screening Score (AUDIT): 0  Housing: Low Risk (11/30/2023)   Housing Stability Vital Sign    Unable to Pay for Housing in the Last Year: No    Number of Times Moved in the Last Year: 0    Homeless in the Last Year: No  Utilities: Not At Risk (11/30/2023)   AHC Utilities    Threatened with loss of utilities: No  Health  Literacy: Adequate Health Literacy (11/30/2023)   B1300 Health Literacy    Frequency of need for help with medical instructions: Never    Outpatient Medications Prior to Visit  Medication Sig Dispense Refill   Calcium  Carbonate-Vitamin D  (CALCIUM  500 + D PO) Take 1 tablet by mouth 2 (two) times daily.     cyclobenzaprine  (FLEXERIL ) 5 MG  tablet Take 1 tablet (5 mg total) by mouth at bedtime. 30 tablet 1   dicyclomine (BENTYL) 20 MG tablet Take 10 mg by mouth every 6 (six) hours as needed.     ELIQUIS  5 MG TABS tablet TAKE 1 TABLET BY MOUTH TWICE A DAY 180 tablet 1   Evolocumab  (REPATHA  SURECLICK) 140 MG/ML SOAJ Inject 140 mg into the skin every 14 (fourteen) days. 6 mL 3   famotidine (PEPCID) 40 MG tablet Take 40 mg by mouth daily.     furosemide  (LASIX ) 20 MG tablet Take 1 tablet (20 mg total) by mouth daily as needed for edema. 90 tablet 1   hydrocortisone  (PROCTOSOL HC) 2.5 % rectal cream Place 1 Application rectally 2 (two) times daily. 30 g 0   loratadine (CLARITIN) 10 MG tablet Take 10 mg by mouth at bedtime.     metoprolol  tartrate (LOPRESSOR ) 25 MG tablet TAKE 1 TABLET BY MOUTH TWICE A DAY 180 tablet 1   nystatin cream (MYCOSTATIN) APPLY TO THE AFFECTED AREA(S) BY TOPICAL ROUTE 2 TIMES PER DAY     pantoprazole  (PROTONIX ) 40 MG tablet TAKE 1 TABLET BY MOUTH EVERY DAY 90 tablet 1   potassium chloride  (KLOR-CON  M) 10 MEQ tablet Take 1 tablet (10 mEq total) by mouth daily. When taking furosemide  90 tablet 1   Propylene Glycol (SYSTANE COMPLETE OP) Place 1 drop into both eyes daily.     triamcinolone  cream (KENALOG ) 0.1 % APPLY A THIN LAYER TO THE AFFECTED AREA(S) BY TOPICAL ROUTE 2 TIMES PER DAY     Wheat Dextrin (BENEFIBER) POWD Take 1 Scoop by mouth at bedtime.     No facility-administered medications prior to visit.    Allergies[1]  Review of Systems  Constitutional:  Negative for appetite change, fatigue and fever.  HENT:  Negative for congestion, ear pain, sinus pressure and  sore throat.   Respiratory:  Negative for cough, chest tightness, shortness of breath and wheezing.   Cardiovascular:  Negative for chest pain and palpitations.  Gastrointestinal:  Negative for abdominal pain, constipation, diarrhea, nausea and vomiting.  Genitourinary:  Negative for dysuria and hematuria.  Musculoskeletal:  Negative for arthralgias, back pain, joint swelling and myalgias.  Skin:  Positive for wound (Left middle toe). Negative for rash.  Neurological:  Negative for dizziness, weakness and headaches.  Psychiatric/Behavioral:  Negative for dysphoric mood. The patient is not nervous/anxious.        Objective:        08/30/2024    9:48 AM 08/22/2024   11:49 AM 06/10/2024    3:51 PM  Vitals with BMI  Height 5' 1 5' 1 5' 1  Weight 192 lbs 14 oz 189 lbs 194 lbs 10 oz  BMI 36.47 35.73 36.79  Systolic 138 140 879  Diastolic 72 70 60  Pulse 56 54 61    Orthostatic VS for the past 72 hrs (Last 3 readings):  Patient Position BP Location  08/30/24 0948 Sitting Left Arm     Physical Exam Vitals reviewed.  Constitutional:      Appearance: Normal appearance.  Neck:     Vascular: No carotid bruit.  Cardiovascular:     Rate and Rhythm: Normal rate and regular rhythm.     Heart sounds: Normal heart sounds.  Pulmonary:     Effort: Pulmonary effort is normal.     Breath sounds: Normal breath sounds.  Abdominal:     General: Bowel sounds are normal.     Palpations: Abdomen is soft.  Tenderness: There is no abdominal tenderness.  Musculoskeletal:     Left foot: Normal range of motion. Swelling and tenderness present. No bony tenderness.  Skin:    Findings: Bruising and erythema present.  Neurological:     Mental Status: She is alert and oriented to person, place, and time.  Psychiatric:        Mood and Affect: Mood normal.        Behavior: Behavior normal.     Media Information   Document Information  Photographic Image: Photos    08/30/2024 10:09   Attached To:  Office Visit on 08/30/24 with Milon Cleaves, PA  Source Information  Milon Cleaves, [place]  [facility]    Health Maintenance Due  Topic Date Due   Medicare Annual Wellness (AWV)  10/09/2024    There are no preventive care reminders to display for this patient.   Lab Results  Component Value Date   TSH 1.630 06/04/2024   Lab Results  Component Value Date   WBC 4.6 06/04/2024   HGB 12.1 06/04/2024   HCT 37.5 06/04/2024   MCV 88 06/04/2024   PLT 225 06/04/2024   Lab Results  Component Value Date   NA 140 06/04/2024   K 5.2 06/04/2024   CO2 24 06/04/2024   GLUCOSE 92 06/04/2024   BUN 23 06/04/2024   CREATININE 1.03 (H) 06/04/2024   BILITOT 0.3 06/13/2024   ALKPHOS 73 06/13/2024   AST 110 (H) 06/13/2024   ALT 105 (H) 06/13/2024   PROT 6.6 06/13/2024   ALBUMIN 3.8 06/13/2024   CALCIUM  9.2 06/04/2024   ANIONGAP 5 01/05/2022   EGFR 58 (L) 06/04/2024   Lab Results  Component Value Date   CHOL 197 06/04/2024   Lab Results  Component Value Date   HDL 95 06/04/2024   Lab Results  Component Value Date   LDLCALC 90 06/04/2024   Lab Results  Component Value Date   TRIG 66 06/04/2024   Lab Results  Component Value Date   CHOLHDL 2.1 06/04/2024   Lab Results  Component Value Date   HGBA1C 5.9 (H) 06/04/2024        Results for orders placed or performed in visit on 08/22/24  CUP PACEART [facility] DEVICE CHECK   Collection Time: 08/22/24 12:23 PM  Result Value Ref Range   Pulse Generator Manufacturer MERM    Date Time Interrogation Session [phone]    Pulse Gen Model [id number] GARR HEATH    Pulse Gen Serial Number [id number]    Clinic Name [facility]    Implantable Pulse Generator Type ICM/ILR    Implantable Pulse Generator Implant Date [id number]      Assessment & Plan:   Assessment & Plan Traumatic injury of toe Traumatic injury of right great toe with nail bed injury and ingrown nail Injury to right great toe with nail  bed trauma and possible ingrown nail. Symptoms improved with antibiotics. No significant infection signs. Swelling and drainage due to inflammation and trauma. Nail regrowth uncertain, podiatry to assess. No fracture suspected. - Continue triple antibiotic ointment and Band-Aid application. - Avoid Epsom salt until podiatry evaluation. - Monitor for infection signs: excessive heat, redness, immobility, color changes. - Apply ice for pain and swelling relief. - Follow up with podiatry for further evaluation and management.      Body mass index is 36.45 kg/m.SABRA   No orders of the defined types were placed in this encounter.   No orders of the defined types were  placed in this encounter.    Follow-up: No follow-ups on file.  An After Visit Summary was printed and given to the patient.   I,Lauren M Auman,acting as a neurosurgeon for [employer], PA.,have documented all relevant documentation on the behalf of Nola Angles, PA,as directed by  Nola Angles, PA while in the presence of Nola Angles, [place].    Nola Angles, [place] [facility] [phone]     [1] No Known Allergies  "

## 2024-09-02 ENCOUNTER — Ambulatory Visit: Admitting: Podiatry

## 2024-09-02 ENCOUNTER — Encounter: Payer: Self-pay | Admitting: Podiatry

## 2024-09-02 ENCOUNTER — Ambulatory Visit

## 2024-09-02 DIAGNOSIS — L03032 Cellulitis of left toe: Secondary | ICD-10-CM

## 2024-09-02 DIAGNOSIS — L02612 Cutaneous abscess of left foot: Secondary | ICD-10-CM

## 2024-09-02 DIAGNOSIS — S99929A Unspecified injury of unspecified foot, initial encounter: Secondary | ICD-10-CM | POA: Insufficient documentation

## 2024-09-02 DIAGNOSIS — L600 Ingrowing nail: Secondary | ICD-10-CM | POA: Diagnosis not present

## 2024-09-02 MED ORDER — CEPHALEXIN 500 MG PO CAPS: 500.0000 mg | ORAL_CAPSULE | Freq: Three times a day (TID) | ORAL | 2 refills | Status: AC

## 2024-09-02 MED ORDER — MUPIROCIN 2 % EX OINT: 1.0000 | TOPICAL_OINTMENT | Freq: Two times a day (BID) | CUTANEOUS | 2 refills | Status: AC

## 2024-09-02 MED ORDER — OXYCODONE HCL 5 MG PO TABS: 5.0000 mg | ORAL_TABLET | ORAL | 0 refills | Status: AC | PRN

## 2024-09-02 NOTE — Assessment & Plan Note (Signed)
 Traumatic injury of right great toe with nail bed injury and ingrown nail Injury to right great toe with nail bed trauma and possible ingrown nail. Symptoms improved with antibiotics. No significant infection signs. Swelling and drainage due to inflammation and trauma. Nail regrowth uncertain, podiatry to assess. No fracture suspected. - Continue triple antibiotic ointment and Band-Aid application. - Avoid Epsom salt until podiatry evaluation. - Monitor for infection signs: excessive heat, redness, immobility, color changes. - Apply ice for pain and swelling relief. - Follow up with podiatry for further evaluation and management.

## 2024-09-02 NOTE — Progress Notes (Unsigned)
 Left third toe ingrown, cellulitic appearing Nail avulsion today Keflex  and mupirocin  Going on for 1 week

## 2024-09-02 NOTE — Patient Instructions (Addendum)
 Place 1/4 cup of epsom salts in a quart of warm tap water.  Submerge your foot or feet in the solution and soak for 20 minutes.  This soak should be done twice a day.  Next, remove your foot or feet from solution, blot dry the affected area. Apply ointment and cover if instructed by your doctor.   IF YOUR SKIN BECOMES IRRITATED WHILE USING THESE INSTRUCTIONS, IT IS OKAY TO SWITCH TO  WHITE VINEGAR AND WATER.  As another alternative soak, you may use antibacterial soap and water.  Monitor for any signs/symptoms of infection. Call the office immediately if any occur or go directly to the emergency room. Call with any questions/concerns.  Apply a small amount of ointment for the first 5-7 days to the ingrown toenail site.  After that time discontinue the use of any ointment and continue to soak and bandage the toe until a dry scab forms.  Hold your Eliquis  for the next 2 to 3 days.

## 2024-09-08 ENCOUNTER — Ambulatory Visit: Attending: Cardiovascular Disease

## 2024-09-08 DIAGNOSIS — I480 Paroxysmal atrial fibrillation: Secondary | ICD-10-CM

## 2024-09-09 LAB — CUP PACEART REMOTE DEVICE CHECK
Date Time Interrogation Session: 20260103230417
Implantable Pulse Generator Implant Date: 20220214

## 2024-09-10 ENCOUNTER — Ambulatory Visit: Payer: Self-pay | Admitting: Cardiovascular Disease

## 2024-09-12 NOTE — Progress Notes (Signed)
 Remote Loop Recorder Transmission

## 2024-09-17 ENCOUNTER — Ambulatory Visit: Admitting: Podiatry

## 2024-09-17 ENCOUNTER — Encounter: Payer: Self-pay | Admitting: Podiatry

## 2024-09-17 DIAGNOSIS — L600 Ingrowing nail: Secondary | ICD-10-CM | POA: Diagnosis not present

## 2024-09-17 DIAGNOSIS — L601 Onycholysis: Secondary | ICD-10-CM

## 2024-09-17 NOTE — Patient Instructions (Signed)
 Look for urea 40% nail gel, cream or ointment and apply to the thickened toenails. This can be bought over the counter, at a pharmacy or online such as [employer].

## 2024-09-17 NOTE — Progress Notes (Signed)
" °   °  °  Subjective:  Patient ID: Tonya Graves, female    DOB: [birth year],  MRN: [id number]  Chief Complaint  Patient presents with   Nail check    Left 3rd toe, total nail removal. It looks great, she said it does itch sometimes but no real pain.  Elliquis, not diabetic.     Tonya Graves presents to clinic today for f/u total nail avulsion of left 3rd toe. Doing well. Pain well controlled.  PCP is Nicholaus Credit, PA-C.  Allergies[1]  Objective:  There were no vitals filed for this visit.  Vascular Examination: Capillary refill time is 3-5 seconds to toes bilateral. Palpable pedal pulses b/l LE. Digital hair present b/l. No pedal edema b/l. Skin temperature gradient WNL b/l. No varicosities b/l. No cyanosis or clubbing noted b/l.   Dermatological Examination: Upon inspection of the left 3rd toe total nail avulsion site, there are no clinical signs of infection.  No purulence, no necrosis, no malodor present.  Minimal to no erythema present.  Eschar formed along nail margin.  Minimal to no pain on palpation of area.   Of note some nail plate separation of great toenails noted with thickened dystrophic appearance.  Assessment/Plan: 1. Ingrown toenail of left foot with infection   2. Nail plate separation     No orders of the defined types were placed in this encounter.  Left third toenail avulsion site healing well without incident.  Discontinue the use of any ointment.  Did discuss possibility of dystrophic nail growth in the future.    Also reviewed briefly discussed nail dystrophy with nail plate separation of great toenails for the patient.  Did have prior nail culture which showed features of microtrauma with bacterial colonization.  We did discuss potential options for this including permanent versus temporary nail avulsion, use of topical urea-based nail gel to help soften the nail.  She will proceed with home topical treatments for now and may follow-up as  needed.    Return if symptoms worsen or fail to improve.   Ethan LITTIE Saddler, DPM, AACFAS [facility]     [address] [place], [place] [address]                Office [phone]  Fax [phone]       [1] No Known Allergies  "

## 2024-10-08 ENCOUNTER — Other Ambulatory Visit: Payer: Self-pay | Admitting: Physician Assistant

## 2024-10-09 ENCOUNTER — Ambulatory Visit

## 2024-10-09 LAB — CUP PACEART REMOTE DEVICE CHECK
Date Time Interrogation Session: 20260203230652
Implantable Pulse Generator Implant Date: 20220214

## 2024-10-10 ENCOUNTER — Ambulatory Visit: Payer: Self-pay | Admitting: Cardiovascular Disease

## 2024-11-07 ENCOUNTER — Ambulatory Visit

## 2024-11-09 ENCOUNTER — Ambulatory Visit

## 2024-12-03 ENCOUNTER — Ambulatory Visit: Admitting: Physician Assistant

## 2024-12-10 ENCOUNTER — Ambulatory Visit

## 2025-01-10 ENCOUNTER — Ambulatory Visit
